# Patient Record
Sex: Female | Born: 1950
Health system: Southern US, Community
[De-identification: ages and names within clinical notes are randomized; demographics above are authoritative.]

## PROBLEM LIST (undated history)

## (undated) DIAGNOSIS — Z8709 Personal history of other diseases of the respiratory system: Secondary | ICD-10-CM

## (undated) DIAGNOSIS — M1990 Unspecified osteoarthritis, unspecified site: Secondary | ICD-10-CM

## (undated) DIAGNOSIS — R203 Hyperesthesia: Secondary | ICD-10-CM

## (undated) DIAGNOSIS — Z8619 Personal history of other infectious and parasitic diseases: Secondary | ICD-10-CM

## (undated) DIAGNOSIS — Z972 Presence of dental prosthetic device (complete) (partial): Secondary | ICD-10-CM

## (undated) DIAGNOSIS — R0600 Dyspnea, unspecified: Secondary | ICD-10-CM

## (undated) DIAGNOSIS — C8330 Diffuse large B-cell lymphoma, unspecified site: Secondary | ICD-10-CM

## (undated) DIAGNOSIS — F419 Anxiety disorder, unspecified: Secondary | ICD-10-CM

## (undated) DIAGNOSIS — J3081 Allergic rhinitis due to animal (cat) (dog) hair and dander: Secondary | ICD-10-CM

## (undated) DIAGNOSIS — H269 Unspecified cataract: Secondary | ICD-10-CM

## (undated) HISTORY — DX: Diffuse large B-cell lymphoma, unspecified site: C83.30

## (undated) HISTORY — PX: CHEST TUBE INSERTION: SHX231

## (undated) HISTORY — PX: BREAST BIOPSY: SHX20

## (undated) HISTORY — PX: LAPAROSCOPIC UNILATERAL SALPINGO OOPHERECTOMY: SHX5935

---

## 1998-09-19 ENCOUNTER — Ambulatory Visit (HOSPITAL_COMMUNITY): Admission: RE | Admit: 1998-09-19 | Discharge: 1998-09-19 | Payer: Self-pay | Admitting: Family Medicine

## 1998-09-19 ENCOUNTER — Encounter: Payer: Self-pay | Admitting: Family Medicine

## 1999-06-14 ENCOUNTER — Other Ambulatory Visit: Admission: RE | Admit: 1999-06-14 | Discharge: 1999-06-14 | Payer: Self-pay | Admitting: Family Medicine

## 1999-10-18 ENCOUNTER — Encounter: Payer: Self-pay | Admitting: Family Medicine

## 1999-10-18 ENCOUNTER — Ambulatory Visit (HOSPITAL_COMMUNITY): Admission: RE | Admit: 1999-10-18 | Discharge: 1999-10-18 | Payer: Self-pay | Admitting: Family Medicine

## 1999-10-27 ENCOUNTER — Ambulatory Visit (HOSPITAL_COMMUNITY): Admission: RE | Admit: 1999-10-27 | Discharge: 1999-10-27 | Payer: Self-pay | Admitting: Family Medicine

## 1999-10-27 ENCOUNTER — Encounter: Payer: Self-pay | Admitting: Family Medicine

## 2000-06-19 ENCOUNTER — Other Ambulatory Visit: Admission: RE | Admit: 2000-06-19 | Discharge: 2000-06-19 | Payer: Self-pay | Admitting: Emergency Medicine

## 2001-07-23 ENCOUNTER — Ambulatory Visit (HOSPITAL_COMMUNITY): Admission: RE | Admit: 2001-07-23 | Discharge: 2001-07-23 | Payer: Self-pay | Admitting: Family Medicine

## 2001-07-23 ENCOUNTER — Encounter: Payer: Self-pay | Admitting: Family Medicine

## 2002-07-24 ENCOUNTER — Ambulatory Visit (HOSPITAL_COMMUNITY): Admission: RE | Admit: 2002-07-24 | Discharge: 2002-07-24 | Payer: Self-pay | Admitting: Family Medicine

## 2002-07-24 ENCOUNTER — Encounter: Payer: Self-pay | Admitting: Family Medicine

## 2003-07-30 ENCOUNTER — Encounter: Payer: Self-pay | Admitting: Family Medicine

## 2003-07-30 ENCOUNTER — Ambulatory Visit (HOSPITAL_COMMUNITY): Admission: RE | Admit: 2003-07-30 | Discharge: 2003-07-30 | Payer: Self-pay | Admitting: Family Medicine

## 2005-06-07 ENCOUNTER — Ambulatory Visit: Payer: Self-pay | Admitting: Gastroenterology

## 2005-08-02 ENCOUNTER — Ambulatory Visit: Payer: Self-pay | Admitting: Gastroenterology

## 2015-03-16 ENCOUNTER — Other Ambulatory Visit (HOSPITAL_COMMUNITY): Payer: Self-pay | Admitting: Internal Medicine

## 2015-03-16 DIAGNOSIS — B182 Chronic viral hepatitis C: Secondary | ICD-10-CM

## 2015-03-21 ENCOUNTER — Ambulatory Visit (HOSPITAL_COMMUNITY)
Admission: RE | Admit: 2015-03-21 | Discharge: 2015-03-21 | Disposition: A | Discharge status: Home / Self Care | Payer: 59 | Source: Ambulatory Visit | Attending: Internal Medicine | Admitting: Internal Medicine

## 2015-03-21 DIAGNOSIS — B1920 Unspecified viral hepatitis C without hepatic coma: Secondary | ICD-10-CM | POA: Insufficient documentation

## 2015-03-21 DIAGNOSIS — B182 Chronic viral hepatitis C: Secondary | ICD-10-CM

## 2016-09-18 ENCOUNTER — Other Ambulatory Visit (HOSPITAL_COMMUNITY): Payer: Self-pay | Admitting: Endocrinology

## 2016-09-18 DIAGNOSIS — E041 Nontoxic single thyroid nodule: Secondary | ICD-10-CM

## 2016-09-18 DIAGNOSIS — R590 Localized enlarged lymph nodes: Secondary | ICD-10-CM

## 2016-09-24 ENCOUNTER — Ambulatory Visit (HOSPITAL_COMMUNITY)
Admission: RE | Admit: 2016-09-24 | Discharge: 2016-09-24 | Disposition: A | Discharge status: Home / Self Care | Payer: BLUE CROSS/BLUE SHIELD | Source: Ambulatory Visit | Attending: Endocrinology | Admitting: Endocrinology

## 2016-09-24 DIAGNOSIS — E041 Nontoxic single thyroid nodule: Secondary | ICD-10-CM | POA: Diagnosis present

## 2016-09-24 DIAGNOSIS — R918 Other nonspecific abnormal finding of lung field: Secondary | ICD-10-CM | POA: Diagnosis not present

## 2016-09-24 DIAGNOSIS — R590 Localized enlarged lymph nodes: Secondary | ICD-10-CM | POA: Diagnosis present

## 2016-09-25 ENCOUNTER — Other Ambulatory Visit (HOSPITAL_COMMUNITY): Payer: Self-pay | Admitting: Endocrinology

## 2016-09-25 DIAGNOSIS — R591 Generalized enlarged lymph nodes: Secondary | ICD-10-CM

## 2016-10-02 ENCOUNTER — Other Ambulatory Visit: Payer: Self-pay | Admitting: Radiology

## 2016-10-03 ENCOUNTER — Ambulatory Visit (HOSPITAL_COMMUNITY)
Admission: RE | Admit: 2016-10-03 | Discharge: 2016-10-03 | Disposition: A | Discharge status: Home / Self Care | Payer: BLUE CROSS/BLUE SHIELD | Source: Ambulatory Visit | Attending: Endocrinology | Admitting: Endocrinology

## 2016-10-03 ENCOUNTER — Encounter (HOSPITAL_COMMUNITY): Payer: Self-pay

## 2016-10-03 DIAGNOSIS — Z806 Family history of leukemia: Secondary | ICD-10-CM | POA: Insufficient documentation

## 2016-10-03 DIAGNOSIS — R911 Solitary pulmonary nodule: Secondary | ICD-10-CM | POA: Insufficient documentation

## 2016-10-03 DIAGNOSIS — R591 Generalized enlarged lymph nodes: Secondary | ICD-10-CM

## 2016-10-03 DIAGNOSIS — Z85828 Personal history of other malignant neoplasm of skin: Secondary | ICD-10-CM | POA: Insufficient documentation

## 2016-10-03 DIAGNOSIS — R222 Localized swelling, mass and lump, trunk: Secondary | ICD-10-CM | POA: Diagnosis not present

## 2016-10-03 DIAGNOSIS — J9383 Other pneumothorax: Secondary | ICD-10-CM | POA: Diagnosis not present

## 2016-10-03 DIAGNOSIS — J45909 Unspecified asthma, uncomplicated: Secondary | ICD-10-CM | POA: Diagnosis not present

## 2016-10-03 DIAGNOSIS — B1920 Unspecified viral hepatitis C without hepatic coma: Secondary | ICD-10-CM | POA: Insufficient documentation

## 2016-10-03 DIAGNOSIS — Z801 Family history of malignant neoplasm of trachea, bronchus and lung: Secondary | ICD-10-CM | POA: Diagnosis not present

## 2016-10-03 DIAGNOSIS — R590 Localized enlarged lymph nodes: Secondary | ICD-10-CM | POA: Insufficient documentation

## 2016-10-03 DIAGNOSIS — Z87891 Personal history of nicotine dependence: Secondary | ICD-10-CM | POA: Diagnosis not present

## 2016-10-03 HISTORY — DX: Anxiety disorder, unspecified: F41.9

## 2016-10-03 LAB — PROTIME-INR
INR: 1.01
PROTHROMBIN TIME: 13.3 s (ref 11.4–15.2)

## 2016-10-03 LAB — CBC
HCT: 38 % (ref 36.0–46.0)
HEMOGLOBIN: 12.7 g/dL (ref 12.0–15.0)
MCH: 29.2 pg (ref 26.0–34.0)
MCHC: 33.4 g/dL (ref 30.0–36.0)
MCV: 87.4 fL (ref 78.0–100.0)
PLATELETS: 319 10*3/uL (ref 150–400)
RBC: 4.35 MIL/uL (ref 3.87–5.11)
RDW: 12.9 % (ref 11.5–15.5)
WBC: 6.5 10*3/uL (ref 4.0–10.5)

## 2016-10-03 LAB — APTT: APTT: 27 s (ref 24–36)

## 2016-10-03 MED ORDER — SODIUM CHLORIDE 0.9 % IV SOLN
INTRAVENOUS | Status: DC
  Administered 2016-10-03: 11:00:00 via INTRAVENOUS

## 2016-10-03 MED ORDER — FENTANYL CITRATE (PF) 100 MCG/2ML IJ SOLN
INTRAMUSCULAR | Status: AC
  Filled 2016-10-03: qty 4

## 2016-10-03 MED ORDER — MIDAZOLAM HCL 2 MG/2ML IJ SOLN
INTRAMUSCULAR | Status: AC | PRN
  Administered 2016-10-03 (×2): 1 mg via INTRAVENOUS
  Administered 2016-10-03: 0.5 mg via INTRAVENOUS

## 2016-10-03 MED ORDER — FENTANYL CITRATE (PF) 100 MCG/2ML IJ SOLN
INTRAMUSCULAR | Status: AC | PRN
  Administered 2016-10-03: 50 ug via INTRAVENOUS

## 2016-10-03 MED ORDER — MIDAZOLAM HCL 2 MG/2ML IJ SOLN
INTRAMUSCULAR | Status: AC
  Filled 2016-10-03: qty 6

## 2016-10-03 NOTE — Consult Note (Signed)
Chief Complaint: Patient was seen in consultation today for ultrasound guided right neck lymph node biopsy   Referring Physician(s): Balan,Bindubal  Supervising Physician: Arne Cleveland  Patient Status: [facility] - Out-pt  History of Present Illness: Monica Gray is a 65 y.o. female , former smoker, with past medical history significant for asthma, hepatitis C with previous treatment, prior spontaneous right pneumothoraces 2 in the late 80's, recently diagnosed left facial basal cell carcinoma. Patient has noted some right neck adenopathy since March 2016. Imaging has recently revealed large right level 5A nodal mass measuring up to 2.9 cm.In addition there were pleural masses of the medial posterior right chest and anterior lateral left chest. Patient has no known primary malignancy. She presents today for ultrasound-guided biopsy of this enlarged right neck lymph node for further evaluation.  Past medical history: See above, denies hypertension, diabetes, coronary artery disease, COPD  Past surgical history: dental implants, right breast biopsy, tubal pregnancy      Allergies: Shellfish allergy  Medications: Prior to Admission medications   Not on File     FH: history of leukemia in mother and lung cancer in brother  Social History   Social History  . Marital status: Married    Spouse name: N/A  . Number of children: N/A  . Years of education: N/A   Social History Main Topics  . Smoking status: Not on file  . Smokeless tobacco: Not on file  . Alcohol use Not on file  . Drug use: Unknown  . Sexual activity: Not on file   Other Topics Concern  . Not on file   Social History Narrative  . No narrative on file      Review of Systems currently denies fever although has had temperature elevations over the last month; has had some intermittent lateral chest discomfort, no significant dyspnea or cough. Denies abdominal pain, nausea, vomiting or abnormal bleeding.  Does have occasional left upper back/shoulder discomfort, night sweats  Vital Signs: BP (!) 156/78 (BP Location: Left Arm)   Pulse 90   Temp 98.6 F (37 C) (Oral)   Resp 18   SpO2 100%   Physical Exam patient awake, alert. Palpable, nontender right neck lymph node;Chest clear to auscultation bilat. Heart with regular rate and rhythm. Abdomen soft, positive bowel sounds, nontender. Lower extremities with full range of motion and no significant edema.  Mallampati Score:     Imaging: Ct Soft Tissue Neck Wo Contrast  Result Date: 09/25/2016 CLINICAL DATA:  Enlarged right-sided lymph nodes. EXAM: CT NECK WITHOUT CONTRAST TECHNIQUE: Multidetector CT imaging of the neck was performed following the standard protocol without intravenous contrast. COMPARISON:  None. FINDINGS: Pharynx and larynx: The nasopharynx is clear. The oropharynx and hypopharynx are normal. The epiglottis is normal. The supraglottic larynx, glottis and subglottic larynx are normal. No retropharyngeal collection. The parapharyngeal spaces are preserved. Salivary glands: The parotid and submandibular glands are normal. No sialolithiasis or salivary ductal dilatation. Thyroid: There is multifocal hypoattenuation within the thyroid gland, better evaluated on recent thyroid ultrasound. Lymph nodes: There is an enlarged level 5A lymph node on the right that measures 2.1 x 1.1 x 2.9 cm. There are multiple other adjacent smaller lymph nodes that measure less than 1 cm. A left level 4 lymph node measures 1.1 cm. Vascular: Assessment of the vessels is limited without IV contrast. Limited intracranial: Normal Mastoids and visualized paranasal sinuses: Clear Skeleton: Is no lytic or blastic lesions. No advanced bony spinal canal stenosis. Upper chest:  There is a posterior pleural-based soft tissue mass along the medial posterior aspect of the right hemithorax that measures 4.6 x 1.7 cm. There is a medium-sized right pleural effusion. The mass  encroaches on the right T2-3 neural foramen. No definite extension into the spinal canal. Additionally, there is a left anterior pleural mass measuring 2.1 x 0.8 cm. There is a focal area of pleural calcification near the left lung apex. Other: None. IMPRESSION: 1. Large right level 5A a nodal mass measuring up to 2.9 cm. This is suggestive of metastatic disease from an unknown primary. Histologic sampling is recommended. 2. Pleural masses of the medial posterior right chest and anterior lateral left chest. The larger mass, adjacent to the right aspect of the T3 and T4 vertebral bodies, extends into the T2-T3 and T3-T4 neural foramina without definite extension into the spinal canal. These masses are also favored to be metastases. A primary pleural malignancy such as mesothelioma is also a consideration. Electronically Signed   By: Ulyses Jarred M.D.   On: 09/25/2016 00:31   US Thyroid  Result Date: 09/25/2016 CLINICAL DATA:  Other.  Thyroid nodule EXAM: THYROID ULTRASOUND TECHNIQUE: Ultrasound examination of the thyroid gland and adjacent soft tissues was performed. COMPARISON:  None. FINDINGS: Parenchymal Echotexture: Mildly heterogenous Estimated total number of nodules >/= 1 cm: 1 Number of spongiform nodules >/=  2 cm not described below (TR1): 0 Number of mixed cystic and solid nodules >/= 1.5 cm not described below (TR2): 0 _________________________________________________________ Isthmus: 0.2 cm No discrete nodules are identified within the thyroid isthmus. _________________________________________________________ Right lobe: 4.6 x 1.6 x 1.7 cm Nodule # 1: Location: Right; Inferior Size: 0.9 x 1.0 x 0.9 cm Composition: solid/almost completely solid (2) Echogenicity: isoechoic (1) Shape: not taller-than-wide (0) Margins: smooth (0) Echogenic foci: none (0) ACR TI-RADS total points: 3. ACR TI-RADS risk category: TR3 (3 points). ACR TI-RADS recommendations: Given size (<1.4 cm) and appearance, this nodule  does NOT meet TI-RADS criteria for biopsy or dedicated follow-up. Tiny adjacent hypoechoic cyst has a benign appearance. _________________________________________________________ Left lobe: 4.3 x 1.4 x 1.2 cm No discrete nodules are identified within the left lobe of the thyroid. Additional findings: There is an abnormally enlarged right neck lymph node with a short axis diameter of 1.1 cm. There is no fatty hilum. IMPRESSION: 0.1 cm nodule in the right lobe. No further recommendations at this time. Abnormally enlarged right neck lymph node. Malignancy is not excluded. The above is in keeping with the ACR TI-RADS recommendations - J Am Coll Radiol 2017;14:587-595. Electronically Signed   By: Marybelle Killings M.D.   On: 09/25/2016 07:41    Labs:  CBC:  Recent Labs  10/03/16 1108  WBC 6.5  HGB 12.7  HCT 38.0  PLT 319    COAGS: No results for input(s): INR, APTT in the last 8760 hours.  BMP: No results for input(s): NA, K, CL, CO2, GLUCOSE, BUN, CALCIUM, CREATININE, GFRNONAA, GFRAA in the last 8760 hours.  Invalid input(s): CMP  LIVER FUNCTION TESTS: No results for input(s): BILITOT, AST, ALT, ALKPHOS, PROT, ALBUMIN in the last 8760 hours.  TUMOR MARKERS: No results for input(s): AFPTM, CEA, CA199, CHROMGRNA in the last 8760 hours.  Assessment and Plan: 65 y.o. female , former smoker, with past medical history significant for asthma, hepatitis C with  previous treatment, prior spontaneous right pneumothoraces 2 in the late 80s, recently diagnosed left facial basal cell carcinoma. Patient has noted some right neck adenopathy since March 2016. Imaging  has recently revealed large right level 5A nodal mass measuring up to 2.9 cm. In addition there were pleural masses of the medial posterior right chest and anterior lateral left chest. Patient has no known primary malignancy. She presents today for ultrasound-guided biopsy of this enlarged right neck lymph node for further evaluation. Risks and  benefits discussed with the patient/husband including, but not limited to bleeding, infection, damage to adjacent structures or low yield requiring additional tests. All of the patient's questions were answered, patient is agreeable to proceed.Consent signed and in chart.     Thank you for this interesting consult.  I greatly enjoyed meeting Aryia Prell and look forward to participating in their care.  A copy of this report was sent to the requesting provider on this date.  Electronically Signed: D. Rowe Robert 10/03/2016, 11:23 AM   I spent a total of 20 minutes  in face to face in clinical consultation, greater than 50% of which was counseling/coordinating care for ultrasound-guided right neck lymph node biopsy

## 2016-10-03 NOTE — Procedures (Signed)
[place] core bx R cerv LAN 18g x4 to surg path No complication No blood loss. See complete dictation in [facility].

## 2016-10-03 NOTE — Discharge Instructions (Signed)
Needle Biopsy, Care After Introduction These instructions give you information about caring for yourself after your procedure. Your doctor may also give you more specific instructions. Call your doctor if you have any problems or questions after your procedure. Follow these instructions at home:  Rest as told by your doctor.  Take medicines only as told by your doctor.  There are many different ways to close and cover the biopsy site, including stitches (sutures), skin glue, and adhesive strips. Follow instructions from your doctor about:  How to take care of your biopsy site.  When and how you should change your bandage (dressing).  When you should remove your dressing.  Removing whatever was used to close your biopsy site.  Check your biopsy site every day for signs of infection. Watch for:  Redness, swelling, or pain.  Fluid, blood, or pus. Contact a doctor if:  You have a fever.  You have redness, swelling, or pain at the biopsy site, and it lasts longer than a few days.  You have fluid, blood, or pus coming from the biopsy site.  You feel sick to your stomach (nauseous).  You throw up (vomit). Get help right away if:  You are short of breath.  You have trouble breathing.  Your chest hurts.  You feel dizzy or you pass out (faint).  You have bleeding that does not stop with pressure or a bandage.  You cough up blood.  Your belly (abdomen) hurts. This information is not intended to replace advice given to you by your health care provider. Make sure you discuss any questions you have with your health care provider. Document Released: 10/18/2008 Document Revised: 04/12/2016 Document Reviewed: 11/01/2014   2017 Elsevier Moderate Conscious Sedation, Adult Sedation is the use of medicines to promote relaxation and relieve discomfort and anxiety. Moderate conscious sedation is a type of sedation. Under moderate conscious sedation, you are less alert than normal,  but you are still able to respond to instructions, touch, or both. Moderate conscious sedation is used during short medical and dental procedures. It is milder than deep sedation, which is a type of sedation under which you cannot be easily woken up. It is also milder than general anesthesia, which is the use of medicines to make you unconscious. Moderate conscious sedation allows you to return to your regular activities sooner. Tell a health care provider about:  Any allergies you have.  All medicines you are taking, including vitamins, herbs, eye drops, creams, and over-the-counter medicines.  Use of steroids (by mouth or creams).  Any problems you or family members have had with sedatives and anesthetic medicines.  Any blood disorders you have.  Any surgeries you have had.  Any medical conditions you have, such as sleep apnea.  Whether you are pregnant or may be pregnant.  Any use of cigarettes, alcohol, marijuana, or street drugs. What are the risks? Generally, this is a safe procedure. However, problems may occur, including:  Getting too much medicine (oversedation).  Nausea.  Allergic reaction to medicines.  Trouble breathing. If this happens, a breathing tube may be used to help with breathing. It will be removed when you are awake and breathing on your own.  Heart trouble.  Lung trouble. What happens before the procedure? Staying hydrated  Follow instructions from your health care provider about hydration, which may include:  Up to 2 hours before the procedure - you may continue to drink clear liquids, such as water, clear fruit juice, black coffee, and plain  tea. Eating and drinking restrictions  Follow instructions from your health care provider about eating and drinking, which may include:  8 hours before the procedure - stop eating heavy meals or foods such as meat, fried foods, or fatty foods.  6 hours before the procedure - stop eating light meals or foods,  such as toast or cereal.  6 hours before the procedure - stop drinking milk or drinks that contain milk.  2 hours before the procedure - stop drinking clear liquids. Medicine  Ask your health care provider about:  Changing or stopping your regular medicines. This is especially important if you are taking diabetes medicines or blood thinners.  Taking medicines such as aspirin and ibuprofen. These medicines can thin your blood. Do not take these medicines before your procedure if your health care provider instructs you not to. Tests and exams  You will have a physical exam.  You may have blood tests done to show:  How well your kidneys and liver are working.  How well your blood can clot. General instructions  Plan to have someone take you home from the hospital or clinic.  If you will be going home right after the procedure, plan to have someone with you for 24 hours. What happens during the procedure?  An IV tube will be inserted into one of your veins.  Medicine to help you relax (sedative) will be given through the IV tube.  The medical or dental procedure will be performed. What happens after the procedure?  Your blood pressure, heart rate, breathing rate, and blood oxygen level will be monitored often until the medicines you were given have worn off.  Do not drive for 24 hours. This information is not intended to replace advice given to you by your health care provider. Make sure you discuss any questions you have with your health care provider. Document Released: 07/31/2001 Document Revised: 04/10/2016 Document Reviewed: 02/25/2016 Elsevier Interactive Patient Education  2017 [employer].   Moderate Conscious Sedation, Adult, Care After These instructions provide you with information about caring for yourself after your procedure. Your health care provider may also give you more specific instructions. Your treatment has been planned according to current medical  practices, but problems sometimes occur. Call your health care provider if you have any problems or questions after your procedure. What can I expect after the procedure? After your procedure, it is common:  To feel sleepy for several hours.  To feel clumsy and have poor balance for several hours.  To have poor judgment for several hours.  To vomit if you eat too soon. Follow these instructions at home: For at least 24 hours after the procedure:   Do not:  Participate in activities where you could fall or become injured.  Drive.  Use heavy machinery.  Drink alcohol.  Take sleeping pills or medicines that cause drowsiness.  Make important decisions or sign legal documents.  Take care of children on your own.  Rest. Eating and drinking  Follow the diet recommended by your health care provider.  If you vomit:  Drink water, juice, or soup when you can drink without vomiting.  Make sure you have little or no nausea before eating solid foods. General instructions  Have a responsible adult stay with you until you are awake and alert.  Take over-the-counter and prescription medicines only as told by your health care provider.  If you smoke, do not smoke without supervision.  Keep all follow-up visits as told by  your health care provider. This is important. Contact a health care provider if:  You keep feeling nauseous or you keep vomiting.  You feel light-headed.  You develop a rash.  You have a fever. Get help right away if:  You have trouble breathing. This information is not intended to replace advice given to you by your health care provider. Make sure you discuss any questions you have with your health care provider. Document Released: 08/26/2013 Document Revised: 04/09/2016 Document Reviewed: 02/25/2016 Elsevier Interactive Patient Education  2017 [employer].

## 2016-10-25 ENCOUNTER — Telehealth: Payer: Self-pay | Admitting: Internal Medicine

## 2016-10-25 ENCOUNTER — Other Ambulatory Visit (HOSPITAL_COMMUNITY): Payer: Self-pay | Admitting: General Surgery

## 2016-10-25 ENCOUNTER — Other Ambulatory Visit: Payer: Self-pay | Admitting: General Surgery

## 2016-10-25 DIAGNOSIS — J948 Other specified pleural conditions: Secondary | ICD-10-CM

## 2016-10-25 NOTE — Telephone Encounter (Signed)
Lmom to call our office per [facility] for a new patient appt. (notes with Charron Coultas and will be forwarded to Pulmonology when appt has been made.)

## 2016-10-28 NOTE — H&P (Signed)
Monica Gray 10/25/2016 10:28 AM Location: [facility] Patient #: [id number] DOB: December 06, 1950 Married / Language: English / Race: White Female   History of Present Illness        This is a 65 year old Caucasian female from [place]. She is here with her husband throughout the encounter. She was referred by Dr. Jacelyn Pi for consideration of right neck lymph node biopsy to rule out lymphoma. Her PCP is Dr. Russella Dar in [place].        The patient has noted some painless right neck mass for a few months. Slowly enlarging.. She's had chronic night sweats but no skin rash. Minimal, if any weight loss. Moderately severe anxiety.       She has had right chest wall pain in the posterior axillary line up high for about 3-4 weeks. Her PCP thought she might have a thyroid mass and referred her to Dr. Chalmers Cater. Ultrasound of the thyroid showed no thyroid mass. CT scan of the neck showed a large, 2.9 cm nodal mass at level V on the right. Some smaller nodes also noted. Also noted were some pleural masses right and left. The pleural masses were thought to be metastatic or possibly mesothelioma. A chest x-ray performed in [place] on November 27 shows bilateral pleural effusions, right greater than left. No congestive heart failure.       Interventional radiology performed a core biopsy of the right neck mass on October 03, 2016. This shows atypical lymphoid proliferation, suspicious for non-Hodgkin's lymphoma B-cell lymphoma but insufficient for flows or definitive diagnosis. The patient was referred for right cervical lymph node biopsy for confirmation.      She has not seen pulmonary medicine for her intrathoracic problems.      She has not seen medical oncology.      Morbidities include history of pneumothorax right lung, spontaneous, 30 years ago. This happened twice with chest tube each time. Former smoker. Has asthma. History of hepatitis C which was treated.       She  lives in [place] Her husband is with her.       We had a very long talk, almost 1 hour. I told her that we were going to schedule her for right cervical lymph node biopsy. She has 1 large and some smaller ones on the right.. The larger one is somewhat stuck behind the sternocleidomastoid. I may try to get one of the smaller nodes out to lower the risk of spinal accessory nerve damage. I discussed the complication of spinal accessory nerve damage with chronic pain and shoulder disability. Also discussed the indications, details, techniques and numerous risk of the surgery including bleeding, infection, reoperation if the lymph node biopsy was nondiagnostic. She understands all these issues. All questions are answered. She agrees with this plan.       In addition, she is referred to pulmonary medicine immediately for her pleural effusions or pleural based masses. This may or may not be part of her presumed lymphoma we need to know if they need to be biopsied or the pleural effusions need to be tapped.      I am also sending her for CT scan of the chest abdomen and pelvis. I think I feel bilateral inguinal lymph nodes. She has not had a mammogram in over 10 years. I told her that at some point, not urgently, she needs to be scheduled for mammograms. She agrees with all of this. She's been taking Advil for pain. She begged  for something stronger and so I gave her some oxycodone.      Other Problems  Arthritis  Asthma  Back Pain  Chest pain  Hemorrhoids  Hepatitis  Migraine Headache   Past Surgical History Breast Biopsy  Right. Oral Surgery   Diagnostic Studies History  Colonoscopy  never Pap Smear  >5 years ago  Allergies  Acetaminophen *ANALGESICS - NonNarcotic*  Codeine Sulfate *ANALGESICS - OPIOID*  Dyes  red dye Zanaflex *MUSCULOSKELETAL THERAPY AGENTS*  SulfADIAZINE *SULFONAMIDES*  Shellfish   Medication History  Ibuprofen (800MG  Tablet, Oral)  Active. Magnesium (500MG  Tablet, Oral) Active. Multi Vitamin Daily (Oral) Active. Vitamin C (500MG  Tablet, Oral) Active. Probiotic (Oral) Active. Basil Oil Active. (holy basil) [employer] Valerian Night Time (530MG  Capsule, Oral) Active. (valerian chamomile- for sleep) Medications Reconciled  Social History  Alcohol use  Remotely quit alcohol use. Caffeine use  Coffee. Illicit drug use  Remotely quit drug use. Tobacco use  Former smoker.  Family History  Alcohol Abuse  Brother, Mother. Arthritis  Mother. Cancer  Mother. Respiratory Condition  Brother, Father. Thyroid problems  Mother.  Pregnancy / Birth History  Age of menopause  36-60 Gravida  1 Maternal age  19-25 Para  0    Review of Systems  General Present- Appetite Loss, Fatigue, Fever and Night Sweats. Not Present- Chills, Weight Gain and Weight Loss. Respiratory Present- Difficulty Breathing. Not Present- Bloody sputum, Chronic Cough, Snoring and Wheezing. Cardiovascular Present- Leg Cramps and Shortness of Breath. Not Present- Chest Pain, Difficulty Breathing Lying Down, Palpitations, Rapid Heart Rate and Swelling of Extremities. Musculoskeletal Present- Back Pain, Joint Pain and Muscle Pain. Not Present- Joint Stiffness, Muscle Weakness and Swelling of Extremities.  Vitals  Weight: 128 lb Height: 64in Body Surface Area: 1.62 m Body Mass Index: 21.97 kg/m  Pulse: 82 (Regular)  BP: 146/82 (Sitting, Left Arm, Standard)    Physical Exam  General Mental Status-Alert. General Appearance-Consistent with stated age. Hydration-Well hydrated. Voice-Normal. Note: Husband is with her throughout the encounter   Head and Neck Head-normocephalic, atraumatic with no lesions or palpable masses. Trachea-midline. Thyroid Gland Characteristics - normal size and consistency.  Eye Eyeball - Bilateral-Extraocular movements intact. Sclera/Conjunctiva - Bilateral-No scleral  icterus.  Chest and Lung Exam Chest and lung exam reveals -quiet, even and easy respiratory effort with no use of accessory muscles and on auscultation, normal breath sounds, no adventitious sounds and normal vocal resonance. Inspection Chest Wall - Normal. Back - normal.  Breast Breast - Left-Symmetric, Non Tender, No Biopsy scars, no Dimpling, No Inflammation, No Lumpectomy scars, No Mastectomy scars, No Peau d' Orange. Breast - Right-Symmetric, Non Tender, No Biopsy scars, no Dimpling, No Inflammation, No Lumpectomy scars, No Mastectomy scars, No Peau d' Orange. Breast Lump-No Palpable Breast Mass.  Cardiovascular Cardiovascular examination reveals -normal heart sounds, regular rate and rhythm with no murmurs and normal pedal pulses bilaterally.  Abdomen Inspection Inspection of the abdomen reveals - No Hernias. Skin - Scar - no surgical scars. Palpation/Percussion Palpation and Percussion of the abdomen reveal - Soft, Non Tender, No Rebound tenderness, No Rigidity (guarding) and No hepatosplenomegaly. Auscultation Auscultation of the abdomen reveals - Bowel sounds normal.  Neurologic Neurologic evaluation reveals -alert and oriented x 3 with no impairment of recent or remote memory. Mental Status-Normal.  Neuropsychiatric Note: Alert. Good insight. Cooperative. Extreme anxiety. Cries frequently. Suspect this is situational and not chronic.   Musculoskeletal Normal Exam - Left-Upper Extremity Strength Normal and Lower Extremity Strength Normal. Normal Exam - Right-Upper Extremity Strength  Normal and Lower Extremity Strength Normal.  Lymphatic Note: There is a 3 cm x 1 cm mass which appears to be associated with sternocleidomastoid muscle on the right. This moves around may be fixed to the muscle. There are some smaller nodes inferior and posterior to this which might be better biopsy. There is also a small node in the posterior triangle on the left. The  thyroid does not feel enlarged. No supraclavicular adenopathy. No significant adenopathy in the axilla on either side. I think there are bilateral femoral lymph nodes. On the right this appears below the inguinal crease. On the left it appears above. There are 2.5 cm in size at least, by my exam. No signs of infection or tenderness.     Assessment & Plan LYMPHADENOPATHY, CERVICAL (R59.0)   You have enlarged lymph nodes in your neck, primarily on the right side I am suspicious that you have enlarged lymph nodes in both groins. The biopsy of your right neck is suspicious for lymphoma, but is not diagnostic you will need an operation to excise one of these lymph nodes in your right neck This will be scheduled in the near future I have discussed the indications, techniques, and numerous risk of the surgery, including injury to the spinal accessory nerve.  You also have some pleural-based masses in your chest and some fluid in your chest This may or may not be due to the same process You'll immediately be referred to a pulmonary medicine specialist  You will also be immediately scheduled for CT scan of chest abdomen and pelvis  You have been given a prescription for hydrocodone to help the right chest wall pain. Takes Senokot twice a day to avoid constipation Drink lots of fluids  PLEURAL EFFUSION (J90) PLEURAL MASS (J94.9) HISTORY OF HEPATITIS C (Z86.19) Impression: Reportedly treated PNEUMOTHORAX, RIGHT (J93.9) Impression: 2 episodes, 1987, 1988, spontaneous. FORMER SMOKER (Z87.891) ANXIETY ATTACK (F41.0) Impression: Recent, situational ASTHMA IN REMISSION [id number])    Edsel Petrin. Dalbert Batman, M.D., [facility] General and Minimally invasive Surgery Breast and Colorectal Surgery Office:   [phone] Pager:   [phone]

## 2016-10-30 ENCOUNTER — Encounter (HOSPITAL_COMMUNITY): Payer: Self-pay | Admitting: *Deleted

## 2016-10-30 ENCOUNTER — Ambulatory Visit (HOSPITAL_COMMUNITY)
Admission: RE | Admit: 2016-10-30 | Discharge: 2016-10-30 | Disposition: A | Discharge status: Home / Self Care | Payer: BLUE CROSS/BLUE SHIELD | Source: Ambulatory Visit | Attending: General Surgery | Admitting: General Surgery

## 2016-10-30 ENCOUNTER — Encounter (HOSPITAL_COMMUNITY): Payer: Self-pay

## 2016-10-30 DIAGNOSIS — J949 Pleural condition, unspecified: Secondary | ICD-10-CM | POA: Diagnosis not present

## 2016-10-30 DIAGNOSIS — I700 Atherosclerosis of aorta: Secondary | ICD-10-CM | POA: Diagnosis not present

## 2016-10-30 DIAGNOSIS — R590 Localized enlarged lymph nodes: Secondary | ICD-10-CM | POA: Insufficient documentation

## 2016-10-30 DIAGNOSIS — J948 Other specified pleural conditions: Secondary | ICD-10-CM

## 2016-10-30 DIAGNOSIS — D3501 Benign neoplasm of right adrenal gland: Secondary | ICD-10-CM | POA: Insufficient documentation

## 2016-10-30 DIAGNOSIS — J90 Pleural effusion, not elsewhere classified: Secondary | ICD-10-CM | POA: Diagnosis present

## 2016-10-30 DIAGNOSIS — N289 Disorder of kidney and ureter, unspecified: Secondary | ICD-10-CM | POA: Diagnosis not present

## 2016-10-30 DIAGNOSIS — I2510 Atherosclerotic heart disease of native coronary artery without angina pectoris: Secondary | ICD-10-CM | POA: Insufficient documentation

## 2016-10-30 LAB — POCT I-STAT CREATININE: CREATININE: 0.6 mg/dL (ref 0.44–1.00)

## 2016-10-30 MED ORDER — IOPAMIDOL (ISOVUE-300) INJECTION 61%
75.0000 mL | Freq: Once | INTRAVENOUS | Status: AC | PRN
  Administered 2016-10-30: 75 mL via INTRAVENOUS

## 2016-10-31 ENCOUNTER — Encounter (HOSPITAL_COMMUNITY)
Admission: RE | Disposition: A | Discharge status: Home / Self Care | Payer: Self-pay | Source: Ambulatory Visit | Attending: General Surgery

## 2016-10-31 ENCOUNTER — Ambulatory Visit (HOSPITAL_COMMUNITY): Payer: BLUE CROSS/BLUE SHIELD | Admitting: Certified Registered"

## 2016-10-31 ENCOUNTER — Ambulatory Visit (HOSPITAL_COMMUNITY)
Admission: RE | Admit: 2016-10-31 | Discharge: 2016-10-31 | Disposition: A | Discharge status: Home / Self Care | Payer: BLUE CROSS/BLUE SHIELD | Source: Ambulatory Visit | Attending: General Surgery | Admitting: General Surgery

## 2016-10-31 ENCOUNTER — Encounter (HOSPITAL_COMMUNITY): Payer: Self-pay | Admitting: Surgery

## 2016-10-31 DIAGNOSIS — J90 Pleural effusion, not elsewhere classified: Secondary | ICD-10-CM | POA: Insufficient documentation

## 2016-10-31 DIAGNOSIS — F419 Anxiety disorder, unspecified: Secondary | ICD-10-CM | POA: Insufficient documentation

## 2016-10-31 DIAGNOSIS — R591 Generalized enlarged lymph nodes: Secondary | ICD-10-CM | POA: Diagnosis present

## 2016-10-31 DIAGNOSIS — Z8619 Personal history of other infectious and parasitic diseases: Secondary | ICD-10-CM | POA: Insufficient documentation

## 2016-10-31 DIAGNOSIS — J45909 Unspecified asthma, uncomplicated: Secondary | ICD-10-CM | POA: Insufficient documentation

## 2016-10-31 DIAGNOSIS — C8330 Diffuse large B-cell lymphoma, unspecified site: Secondary | ICD-10-CM | POA: Diagnosis present

## 2016-10-31 DIAGNOSIS — Z87891 Personal history of nicotine dependence: Secondary | ICD-10-CM | POA: Diagnosis not present

## 2016-10-31 HISTORY — DX: Diffuse large B-cell lymphoma, unspecified site: C83.30

## 2016-10-31 HISTORY — PX: LYMPH NODE BIOPSY: SHX201

## 2016-10-31 HISTORY — DX: Unspecified osteoarthritis, unspecified site: M19.90

## 2016-10-31 HISTORY — DX: Dyspnea, unspecified: R06.00

## 2016-10-31 LAB — CBC WITH DIFFERENTIAL/PLATELET
BASOS ABS: 0 10*3/uL (ref 0.0–0.1)
Basophils Relative: 1 %
EOS PCT: 3 %
Eosinophils Absolute: 0.1 10*3/uL (ref 0.0–0.7)
HEMATOCRIT: 37.6 % (ref 36.0–46.0)
Hemoglobin: 12.8 g/dL (ref 12.0–15.0)
LYMPHS ABS: 0.7 10*3/uL (ref 0.7–4.0)
LYMPHS PCT: 14 %
MCH: 28.8 pg (ref 26.0–34.0)
MCHC: 34 g/dL (ref 30.0–36.0)
MCV: 84.7 fL (ref 78.0–100.0)
Monocytes Absolute: 0.6 10*3/uL (ref 0.1–1.0)
Monocytes Relative: 13 %
NEUTROS ABS: 3.6 10*3/uL (ref 1.7–7.7)
Neutrophils Relative %: 71 %
Platelets: 254 10*3/uL (ref 150–400)
RBC: 4.44 MIL/uL (ref 3.87–5.11)
RDW: 13.3 % (ref 11.5–15.5)
WBC: 5.1 10*3/uL (ref 4.0–10.5)

## 2016-10-31 LAB — COMPREHENSIVE METABOLIC PANEL
ALK PHOS: 50 U/L (ref 38–126)
ALT: 15 U/L (ref 14–54)
AST: 30 U/L (ref 15–41)
Albumin: 4 g/dL (ref 3.5–5.0)
Anion gap: 10 (ref 5–15)
BILIRUBIN TOTAL: 1 mg/dL (ref 0.3–1.2)
BUN: 7 mg/dL (ref 6–20)
CALCIUM: 9.9 mg/dL (ref 8.9–10.3)
CHLORIDE: 103 mmol/L (ref 101–111)
CO2: 26 mmol/L (ref 22–32)
CREATININE: 0.61 mg/dL (ref 0.44–1.00)
Glucose, Bld: 96 mg/dL (ref 65–99)
Potassium: 3.8 mmol/L (ref 3.5–5.1)
Sodium: 139 mmol/L (ref 135–145)
Total Protein: 7.5 g/dL (ref 6.5–8.1)

## 2016-10-31 SURGERY — LYMPH NODE BIOPSY
Anesthesia: General | Site: Neck | Laterality: Right

## 2016-10-31 MED ORDER — FENTANYL CITRATE (PF) 100 MCG/2ML IJ SOLN
INTRAMUSCULAR | Status: DC | PRN
  Administered 2016-10-31 (×3): 100 ug via INTRAVENOUS

## 2016-10-31 MED ORDER — ONDANSETRON HCL 4 MG/2ML IJ SOLN
INTRAMUSCULAR | Status: DC | PRN
  Administered 2016-10-31: 4 mg via INTRAVENOUS

## 2016-10-31 MED ORDER — LIDOCAINE 2% (20 MG/ML) 5 ML SYRINGE
INTRAMUSCULAR | Status: AC
  Filled 2016-10-31: qty 5

## 2016-10-31 MED ORDER — DEXAMETHASONE SODIUM PHOSPHATE 10 MG/ML IJ SOLN
INTRAMUSCULAR | Status: DC | PRN
  Administered 2016-10-31: 4 mg via INTRAVENOUS

## 2016-10-31 MED ORDER — SUCCINYLCHOLINE CHLORIDE 200 MG/10ML IV SOSY
PREFILLED_SYRINGE | INTRAVENOUS | Status: DC | PRN
  Administered 2016-10-31: 90 mg via INTRAVENOUS

## 2016-10-31 MED ORDER — FENTANYL CITRATE (PF) 100 MCG/2ML IJ SOLN
INTRAMUSCULAR | Status: AC
  Filled 2016-10-31: qty 2

## 2016-10-31 MED ORDER — PROPOFOL 10 MG/ML IV BOLUS
INTRAVENOUS | Status: AC
  Filled 2016-10-31: qty 20

## 2016-10-31 MED ORDER — BUPIVACAINE HCL (PF) 0.25 % IJ SOLN
INTRAMUSCULAR | Status: AC
  Filled 2016-10-31: qty 30

## 2016-10-31 MED ORDER — FENTANYL CITRATE (PF) 100 MCG/2ML IJ SOLN: 25.0000 ug | INTRAMUSCULAR | Status: DC | PRN

## 2016-10-31 MED ORDER — MIDAZOLAM HCL 5 MG/5ML IJ SOLN
INTRAMUSCULAR | Status: DC | PRN
  Administered 2016-10-31: 2 mg via INTRAVENOUS

## 2016-10-31 MED ORDER — ONDANSETRON HCL 4 MG/2ML IJ SOLN
INTRAMUSCULAR | Status: AC
  Filled 2016-10-31: qty 2

## 2016-10-31 MED ORDER — PROMETHAZINE HCL 25 MG/ML IJ SOLN: 6.2500 mg | INTRAMUSCULAR | Status: DC | PRN

## 2016-10-31 MED ORDER — PROPOFOL 10 MG/ML IV BOLUS
INTRAVENOUS | Status: DC | PRN
  Administered 2016-10-31: 150 mg via INTRAVENOUS
  Administered 2016-10-31: 50 mg via INTRAVENOUS

## 2016-10-31 MED ORDER — CEFAZOLIN SODIUM-DEXTROSE 2-4 GM/100ML-% IV SOLN
INTRAVENOUS | Status: AC
  Filled 2016-10-31: qty 100

## 2016-10-31 MED ORDER — CHLORHEXIDINE GLUCONATE CLOTH 2 % EX PADS: 6.0000 | MEDICATED_PAD | Freq: Once | CUTANEOUS | Status: DC

## 2016-10-31 MED ORDER — CEFAZOLIN SODIUM-DEXTROSE 2-4 GM/100ML-% IV SOLN
2.0000 g | INTRAVENOUS | Status: AC
  Administered 2016-10-31: 2 g via INTRAVENOUS

## 2016-10-31 MED ORDER — MIDAZOLAM HCL 2 MG/2ML IJ SOLN
INTRAMUSCULAR | Status: AC
  Filled 2016-10-31: qty 2

## 2016-10-31 MED ORDER — DEXAMETHASONE SODIUM PHOSPHATE 10 MG/ML IJ SOLN
INTRAMUSCULAR | Status: AC
  Filled 2016-10-31: qty 1

## 2016-10-31 MED ORDER — BUPIVACAINE HCL (PF) 0.25 % IJ SOLN
INTRAMUSCULAR | Status: DC | PRN
  Administered 2016-10-31: 2 mL

## 2016-10-31 MED ORDER — LACTATED RINGERS IV SOLN
INTRAVENOUS | Status: DC
  Administered 2016-10-31 (×2): via INTRAVENOUS

## 2016-10-31 MED ORDER — 0.9 % SODIUM CHLORIDE (POUR BTL) OPTIME
TOPICAL | Status: DC | PRN
  Administered 2016-10-31: 1000 mL

## 2016-10-31 MED ORDER — LIDOCAINE 2% (20 MG/ML) 5 ML SYRINGE
INTRAMUSCULAR | Status: DC | PRN
  Administered 2016-10-31: 60 mg via INTRAVENOUS

## 2016-10-31 SURGICAL SUPPLY — 36 items
APPLIER CLIP 9.375 SM OPEN (CLIP) ×3
CHLORAPREP W/TINT 26ML (MISCELLANEOUS) ×3 IMPLANT
CLIP APPLIE 9.375 SM OPEN (CLIP) ×1 IMPLANT
CONT SPEC 4OZ CLIKSEAL STRL BL (MISCELLANEOUS) ×3 IMPLANT
COVER SURGICAL LIGHT HANDLE (MISCELLANEOUS) ×3 IMPLANT
DECANTER SPIKE VIAL GLASS SM (MISCELLANEOUS) ×6 IMPLANT
DERMABOND ADVANCED (GAUZE/BANDAGES/DRESSINGS) ×2
DERMABOND ADVANCED .7 DNX12 (GAUZE/BANDAGES/DRESSINGS) ×1 IMPLANT
DRAPE LAPAROTOMY 100X72 PEDS (DRAPES) ×3 IMPLANT
ELECT CAUTERY BLADE 6.4 (BLADE) ×3 IMPLANT
ELECT REM PT RETURN 9FT ADLT (ELECTROSURGICAL) ×3
ELECTRODE REM PT RTRN 9FT ADLT (ELECTROSURGICAL) ×1 IMPLANT
GLOVE BIOGEL PI IND STRL 7.0 (GLOVE) ×1 IMPLANT
GLOVE BIOGEL PI INDICATOR 7.0 (GLOVE) ×2
GLOVE EUDERMIC 7 POWDERFREE (GLOVE) ×3 IMPLANT
GLOVE SURG SS PI 6.5 STRL IVOR (GLOVE) ×6 IMPLANT
GLOVE SURG SS PI 7.0 STRL IVOR (GLOVE) ×3 IMPLANT
GOWN STRL REUS W/ TWL LRG LVL3 (GOWN DISPOSABLE) ×2 IMPLANT
GOWN STRL REUS W/TWL LRG LVL3 (GOWN DISPOSABLE) ×4
KIT BASIN OR (CUSTOM PROCEDURE TRAY) ×3 IMPLANT
KIT ROOM TURNOVER OR (KITS) ×3 IMPLANT
NEEDLE HYPO 25GX1X1/2 BEV (NEEDLE) ×3 IMPLANT
NS IRRIG 1000ML POUR BTL (IV SOLUTION) ×3 IMPLANT
PACK SURGICAL SETUP 50X90 (CUSTOM PROCEDURE TRAY) ×3 IMPLANT
PAD ARMBOARD 7.5X6 YLW CONV (MISCELLANEOUS) ×3 IMPLANT
PENCIL BUTTON HOLSTER BLD 10FT (ELECTRODE) ×3 IMPLANT
SPONGE LAP 4X18 X RAY DECT (DISPOSABLE) ×6 IMPLANT
SUT MON AB 4-0 PC3 18 (SUTURE) ×3 IMPLANT
SUT VIC AB 3-0 SH 18 (SUTURE) ×3 IMPLANT
SYR BULB 3OZ (MISCELLANEOUS) ×3 IMPLANT
SYR CONTROL 10ML LL (SYRINGE) ×3 IMPLANT
TOWEL OR 17X24 6PK STRL BLUE (TOWEL DISPOSABLE) ×3 IMPLANT
TOWEL OR 17X26 10 PK STRL BLUE (TOWEL DISPOSABLE) ×3 IMPLANT
TUBE CONNECTING 12'X1/4 (SUCTIONS) ×1
TUBE CONNECTING 12X1/4 (SUCTIONS) ×2 IMPLANT
YANKAUER SUCT BULB TIP NO VENT (SUCTIONS) ×3 IMPLANT

## 2016-10-31 NOTE — Discharge Instructions (Signed)
Place the ice pack on the wound for 10 minutes at a time.  Do this 2 or 3 times in our  You may take a shower tomorrow, if you desire. No tub bath  You have a prescription for oxycodone at home.  Use that as needed to control incisional pain  Call Dr. Dalbert Batman 's office if you have severe pain or swelling  Elevate the head of the bed or sit up in a chair as much as possible today and this evening   I strongly advise referral to a medical oncologist.  I discussed this with your husband and he asked me to go ahead with this My office will contact you regarding referral to a medical oncologist.

## 2016-10-31 NOTE — Op Note (Signed)
Patient Name:           Monica Gray   Date of Surgery:        10/31/2016  Pre op Diagnosis:      Lymphadenopathy, rule out lymphoma  Post op Diagnosis:    Same  Procedure:                 Excision deep cervical lymph node, right posterior triangle  Surgeon:                     Edsel Petrin. Dalbert Batman, M.D., FACS  Assistant:                      OR staff   Indication for Assistant: N/A  Operative Indications:   This is a 65 year old Caucasian female from [place].  She was referred by Dr. Jacelyn Pi for consideration of right neck lymph node biopsy to rule out lymphoma. Her PCP is Dr. Russella Dar in [place].        The patient has noted some painless right neck mass for a few months. Slowly enlarging.. She's had chronic night sweats but no skin rash. Minimal, if any weight loss. Moderately severe anxiety.       She has had right chest wall pain in the posterior axillary line up high for about 3-4 weeks. Her PCP thought she might have a thyroid mass and referred her to Dr. Chalmers Cater. Ultrasound of the thyroid showed no thyroid mass. CT scan of the neck showed a large, 2.9 cm nodal mass at level V on the right. Some smaller nodes also noted. Also noted were some pleural masses right and left. The pleural masses were thought to be metastatic or possibly mesothelioma. A chest x-ray performed in [place] on November 27 shows bilateral pleural effusions, right greater than left. No congestive heart failure.       Interventional radiology performed a core biopsy of the right neck mass on October 03, 2016. This shows atypical lymphoid proliferation, suspicious for non-Hodgkin's lymphoma B-cell lymphoma but insufficient for flows or definitive diagnosis. The patient was referred for right cervical lymph node biopsy for confirmation.      She has not seen pulmonary medicine for her intrathoracic problems.      She has not seen medical oncology.      On exam I found a relatively large fixed mass  involving the right sternocleidomastoid muscle and some smaller but still pathologically enlarged lymph nodes extending inferiorly in the posterior triangle.  There are small lymph nodes in the left neck.  No axillary adenopathy.  Significant bilateral deep inguinal adenopathy.  CT scan of the chest abdomen and pelvis was performed and shows thoracic and abdominal adenopathy, suspicious for lymphoma.      Morbidities include history of pneumothorax right lung, spontaneous, 30 years ago. This happened twice with chest tube each time. Former smoker. Has asthma. History of hepatitis C which was treated.. I told her that we were going to schedule her for right cervical lymph node biopsy. She has 1 large and some smaller ones on the right.. The larger one is somewhat stuck behind the sternocleidomastoid. I may try to get one of the smaller nodes out to lower the risk of spinal accessory nerve damage..       In addition, she is referred to pulmonary medicine immediately for her pleural effusions or pleural based masses. This may or may not be part of her presumed lymphoma  we need to know if they need to be biopsied or the pleural effusions need to be tapped.       She will need to be referred to medical oncology regardless   Operative Findings:       I removed a pathologically enlarged lymph node from the deep right posterior triangle.  The spinal accessory nerve was slightly draped over this but was slowly dissected off and preserved.  I removed the lymph node intact.  It was 1.5 cm x 1.2 cm x 0.5 cm in size.  Sent to the lab with the appropriate history attached fresh in saline.  Lymphoma workup was requested.`  Procedure in Detail:          Following the induction of general endotracheal anesthesia the patient was positioned with her arms at her sides and a roll behind her shoulders which helped to extend the neck.  The head was turned the left.  A little bit of hair had to be clipped.  The right neck  was then extensively prepped and draped in a sterile fashion.  Intravenous antibiotics were given.  Surgical timeout was performed.  0.25% Marcaine was used as local infiltration anesthetic.      I made a transverse skin crease incision, somewhat oblique but in skin lines, overlying the involved lymph node.  I stayed below the fixed lymph node mass superiorly.  Dissection was carefully taken down through the platysma muscle.  I then identified the lymph node and the spinal accessory nerve.  I slowly dissected the lymph node away from the surrounding structures taking great care to avoid the spinal accessory nerve.  Small vascular structures were controlled with small metal clips and divided with a knife.  The lymph node was removed intact.  There was no bleeding.  The lymph node was sent to the lab with the appropriate history attached.  The spinal accessory nerve was identified and inspected and seemed to be intact without any injury whatsoever.  The wound was irrigated.  The platysma muscle was closed with interrupted sutures of 3-0 Vicryl.  The skin was closed with a running subcuticular suture of 4-0 Monocryl and Dermabond.  The patient tolerated the procedure well was taken to PACU in stable condition.  EBL 10 mL or less.  Counts correct.  Complications none.     Edsel Petrin. Dalbert Batman, M.D., FACS General and Minimally Invasive Surgery Breast and Colorectal Surgery  10/31/2016 4:17 PM

## 2016-10-31 NOTE — Transfer of Care (Signed)
Immediate Anesthesia Transfer of Care Note  Patient: Environmental health practitioner  Procedure(s) Performed: Procedure(s): EXCISION DEEP RIGHT CERVICAL LYMPH NODES (Right)  Patient Location: PACU  Anesthesia Type:General  Level of Consciousness: awake, oriented and patient cooperative  Airway & Oxygen Therapy: Patient Spontanous Breathing and Patient connected to nasal cannula oxygen  Post-op Assessment: Report given to RN, Post -op Vital signs reviewed and stable and Patient moving all extremities  Post vital signs: Reviewed and stable  Last Vitals:  Vitals:   10/31/16 1427  BP: (!) 148/76  Pulse: 91  Resp: 18  Temp: 37.2 C    Last Pain:  Vitals:   10/31/16 1427  TempSrc: Oral  PainSc:       Patients Stated Pain Goal: 7 ([id number])  Complications: No apparent anesthesia complications

## 2016-10-31 NOTE — Anesthesia Postprocedure Evaluation (Signed)
Anesthesia Post Note  Patient: Environmental health practitioner  Procedure(s) Performed: Procedure(s) (LRB): EXCISION DEEP RIGHT CERVICAL LYMPH NODES (Right)  Patient location during evaluation: PACU Anesthesia Type: General Level of consciousness: awake and alert Pain management: pain level controlled Vital Signs Assessment: post-procedure vital signs reviewed and stable Respiratory status: spontaneous breathing, nonlabored ventilation, respiratory function stable and patient connected to nasal cannula oxygen Cardiovascular status: blood pressure returned to baseline and stable Postop Assessment: no signs of nausea or vomiting Anesthetic complications: no    Last Vitals:  Vitals:   10/31/16 1701 10/31/16 1715  BP: (!) 134/96 129/72  Pulse: 88 87  Resp: (!) 21 (!) 21  Temp:      Last Pain:  Vitals:   10/31/16 1427  TempSrc: Oral  PainSc:                  Fumie Fiallo S

## 2016-10-31 NOTE — Anesthesia Preprocedure Evaluation (Signed)
Anesthesia Evaluation  Patient identified by MRN, date of birth, ID band Patient awake    Reviewed: Allergy & Precautions, NPO status , Patient's Chart, lab work & pertinent test results  Airway Mallampati: II  TM Distance: >3 FB Neck ROM: Full    Dental no notable dental hx.    Pulmonary asthma , former smoker,    Pulmonary exam normal breath sounds clear to auscultation       Cardiovascular negative cardio ROS Normal cardiovascular exam Rhythm:Regular Rate:Normal     Neuro/Psych negative neurological ROS  negative psych ROS   GI/Hepatic negative GI ROS, (+) Hepatitis -, C  Endo/Other  negative endocrine ROS  Renal/GU negative Renal ROS  negative genitourinary   Musculoskeletal negative musculoskeletal ROS (+)   Abdominal   Peds negative pediatric ROS (+)  Hematology negative hematology ROS (+)   Anesthesia Other Findings   Reproductive/Obstetrics negative OB ROS                             Anesthesia Physical Anesthesia Plan  ASA: II  Anesthesia Plan: General   Post-op Pain Management:    Induction: Intravenous  Airway Management Planned: Oral ETT  Additional Equipment:   Intra-op Plan:   Post-operative Plan: Extubation in OR  Informed Consent: I have reviewed the patients History and Physical, chart, labs and discussed the procedure including the risks, benefits and alternatives for the proposed anesthesia with the patient or authorized representative who has indicated his/her understanding and acceptance.   Dental advisory given  Plan Discussed with: CRNA and Surgeon  Anesthesia Plan Comments:         Anesthesia Quick Evaluation

## 2016-10-31 NOTE — Interval H&P Note (Signed)
History and Physical Interval Note:  10/31/2016 2:49 PM  Monica Gray  has presented today for surgery, with the diagnosis of lymphadenopathy  The various methods of treatment have been discussed with the patient and family. After consideration of risks, benefits and other options for treatment, the patient has consented to  Procedure(s): EXCISION DEEP RIGHT CERVICAL LYMPH NODES (Right) as a surgical intervention .  The patient's history has been reviewed, patient examined, no change in status, stable for surgery.  I have reviewed the patient's chart and labs.  Questions were answered to the patient's satisfaction.     Adin Hector

## 2016-10-31 NOTE — Anesthesia Procedure Notes (Signed)
Procedure Name: Intubation Date/Time: 10/31/2016 3:28 PM Performed by: Melina Copa, Gerad Cornelio R Pre-anesthesia Checklist: Patient identified, Emergency Drugs available, Suction available and Patient being monitored Patient Re-evaluated:Patient Re-evaluated prior to inductionOxygen Delivery Method: Circle System Utilized Preoxygenation: Pre-oxygenation with 100% oxygen Intubation Type: IV induction Ventilation: Mask ventilation without difficulty Laryngoscope Size: Mac and 3 Grade View: Grade I Tube type: Oral Tube size: 7.5 mm Number of attempts: 1 Airway Equipment and Method: Stylet Placement Confirmation: ETT inserted through vocal cords under direct vision,  positive ETCO2 and breath sounds checked- equal and bilateral Secured at: 21 cm Tube secured with: Tape Dental Injury: Teeth and Oropharynx as per pre-operative assessment

## 2016-11-01 ENCOUNTER — Encounter (HOSPITAL_COMMUNITY): Payer: Self-pay | Admitting: General Surgery

## 2016-11-07 ENCOUNTER — Other Ambulatory Visit (HOSPITAL_COMMUNITY): Payer: Self-pay | Admitting: Oncology

## 2016-11-07 ENCOUNTER — Encounter (HOSPITAL_COMMUNITY): Payer: Self-pay | Admitting: Oncology

## 2016-11-07 DIAGNOSIS — C8338 Diffuse large B-cell lymphoma, lymph nodes of multiple sites: Secondary | ICD-10-CM

## 2016-11-08 ENCOUNTER — Other Ambulatory Visit (HOSPITAL_COMMUNITY): Payer: Self-pay | Admitting: Oncology

## 2016-11-08 ENCOUNTER — Telehealth (HOSPITAL_COMMUNITY): Payer: Self-pay | Admitting: Emergency Medicine

## 2016-11-08 NOTE — Telephone Encounter (Signed)
Called pt to explain that her biopsy had come back showing that she had diffuse large b-cell lymphoma (which no one had told her).  Explained that we needed to get a PET scan and the first available was at [place].  11/09/2016 at 12:30 pm.  Arrive 30 minutes early and nothing to eat or drink 6 hours prior.  Explained we need to get a BMBX to look at how her marrow is functioning. This is at [facility] and she is to be NPO the night prior at midnight and will need a driver the day of.  She will get an IV with sedation that day and the procedure itself only takes about 10 minutes.  I explained that we were going to get her back to Dr Dalbert Batman for port placement because eventually she will need chemo.  She didn't understand how or why.  I told her that we treat diffuse large b-cell lymphomas.  Pt verbalized understanding.  Pt had lots of anxiety.

## 2016-11-09 ENCOUNTER — Encounter
Admission: RE | Admit: 2016-11-09 | Discharge: 2016-11-09 | Disposition: A | Discharge status: Home / Self Care | Payer: BLUE CROSS/BLUE SHIELD | Source: Ambulatory Visit | Attending: Oncology | Admitting: Oncology

## 2016-11-09 DIAGNOSIS — C8338 Diffuse large B-cell lymphoma, lymph nodes of multiple sites: Secondary | ICD-10-CM | POA: Insufficient documentation

## 2016-11-09 LAB — GLUCOSE, CAPILLARY: Glucose-Capillary: 91 mg/dL (ref 65–99)

## 2016-11-09 MED ORDER — FLUDEOXYGLUCOSE F - 18 (FDG) INJECTION
12.6800 | Freq: Once | INTRAVENOUS | Status: AC | PRN
  Administered 2016-11-09: 12.68 via INTRAVENOUS

## 2016-11-13 ENCOUNTER — Other Ambulatory Visit: Payer: Self-pay | Admitting: General Surgery

## 2016-11-13 ENCOUNTER — Ambulatory Visit (HOSPITAL_COMMUNITY): Admission: RE | Admit: 2016-11-13 | Payer: BLUE CROSS/BLUE SHIELD | Source: Ambulatory Visit

## 2016-11-13 ENCOUNTER — Other Ambulatory Visit: Payer: Self-pay | Admitting: Student

## 2016-11-13 DIAGNOSIS — N63 Unspecified lump in unspecified breast: Secondary | ICD-10-CM

## 2016-11-14 ENCOUNTER — Ambulatory Visit (HOSPITAL_COMMUNITY)
Admission: RE | Admit: 2016-11-14 | Discharge: 2016-11-14 | Disposition: A | Discharge status: Home / Self Care | Payer: BLUE CROSS/BLUE SHIELD | Source: Ambulatory Visit | Attending: Oncology | Admitting: Oncology

## 2016-11-14 ENCOUNTER — Other Ambulatory Visit: Payer: Self-pay | Admitting: General Surgery

## 2016-11-14 ENCOUNTER — Ambulatory Visit
Admission: RE | Admit: 2016-11-14 | Discharge: 2016-11-14 | Disposition: A | Discharge status: Home / Self Care | Payer: BLUE CROSS/BLUE SHIELD | Source: Ambulatory Visit | Attending: General Surgery | Admitting: General Surgery

## 2016-11-14 ENCOUNTER — Other Ambulatory Visit: Payer: BLUE CROSS/BLUE SHIELD

## 2016-11-14 ENCOUNTER — Encounter (HOSPITAL_COMMUNITY): Payer: Self-pay

## 2016-11-14 DIAGNOSIS — N63 Unspecified lump in unspecified breast: Secondary | ICD-10-CM

## 2016-11-14 DIAGNOSIS — J45909 Unspecified asthma, uncomplicated: Secondary | ICD-10-CM | POA: Diagnosis not present

## 2016-11-14 DIAGNOSIS — B1920 Unspecified viral hepatitis C without hepatic coma: Secondary | ICD-10-CM | POA: Insufficient documentation

## 2016-11-14 DIAGNOSIS — R918 Other nonspecific abnormal finding of lung field: Secondary | ICD-10-CM | POA: Diagnosis not present

## 2016-11-14 DIAGNOSIS — C8338 Diffuse large B-cell lymphoma, lymph nodes of multiple sites: Secondary | ICD-10-CM | POA: Insufficient documentation

## 2016-11-14 DIAGNOSIS — Z87891 Personal history of nicotine dependence: Secondary | ICD-10-CM | POA: Diagnosis not present

## 2016-11-14 DIAGNOSIS — F419 Anxiety disorder, unspecified: Secondary | ICD-10-CM | POA: Insufficient documentation

## 2016-11-14 DIAGNOSIS — Z85828 Personal history of other malignant neoplasm of skin: Secondary | ICD-10-CM | POA: Diagnosis not present

## 2016-11-14 DIAGNOSIS — M1990 Unspecified osteoarthritis, unspecified site: Secondary | ICD-10-CM | POA: Diagnosis not present

## 2016-11-14 DIAGNOSIS — R591 Generalized enlarged lymph nodes: Secondary | ICD-10-CM | POA: Diagnosis not present

## 2016-11-14 DIAGNOSIS — N6310 Unspecified lump in the right breast, unspecified quadrant: Secondary | ICD-10-CM

## 2016-11-14 HISTORY — PX: BONE MARROW ASPIRATION: SHX1252

## 2016-11-14 LAB — CBC
HEMATOCRIT: 38.5 % (ref 36.0–46.0)
HEMOGLOBIN: 13.1 g/dL (ref 12.0–15.0)
MCH: 28.8 pg (ref 26.0–34.0)
MCHC: 34 g/dL (ref 30.0–36.0)
MCV: 84.6 fL (ref 78.0–100.0)
Platelets: 266 10*3/uL (ref 150–400)
RBC: 4.55 MIL/uL (ref 3.87–5.11)
RDW: 14.1 % (ref 11.5–15.5)
WBC: 6.4 10*3/uL (ref 4.0–10.5)

## 2016-11-14 LAB — PROTIME-INR
INR: 1.04
PROTHROMBIN TIME: 13.6 s (ref 11.4–15.2)

## 2016-11-14 LAB — APTT: APTT: 27 s (ref 24–36)

## 2016-11-14 LAB — BONE MARROW EXAM

## 2016-11-14 MED ORDER — FENTANYL CITRATE (PF) 100 MCG/2ML IJ SOLN
INTRAMUSCULAR | Status: AC
  Filled 2016-11-14: qty 4

## 2016-11-14 MED ORDER — SODIUM CHLORIDE 0.9 % IV SOLN
INTRAVENOUS | Status: DC
  Administered 2016-11-14: 07:00:00 via INTRAVENOUS

## 2016-11-14 MED ORDER — MIDAZOLAM HCL 2 MG/2ML IJ SOLN
INTRAMUSCULAR | Status: AC | PRN
Start: 2016-11-14 — End: 2016-11-14
  Administered 2016-11-14 (×2): 1 mg via INTRAVENOUS

## 2016-11-14 MED ORDER — FENTANYL CITRATE (PF) 100 MCG/2ML IJ SOLN
INTRAMUSCULAR | Status: AC | PRN
  Administered 2016-11-14: 50 ug via INTRAVENOUS

## 2016-11-14 MED ORDER — MIDAZOLAM HCL 2 MG/2ML IJ SOLN
INTRAMUSCULAR | Status: AC
  Filled 2016-11-14: qty 4

## 2016-11-14 NOTE — Discharge Instructions (Signed)
Bone Marrow Aspiration and Bone Marrow Biopsy, Adult, Care After °This sheet gives you information about how to care for yourself after your procedure. Your health care provider may also give you more specific instructions. If you have problems or questions, contact your health care provider. °What can I expect after the procedure? °After the procedure, it is common to have: °· Mild pain and tenderness. °· Swelling. °· Bruising. °Follow these instructions at home: °· Take over-the-counter or prescription medicines only as told by your health care provider. °· Do not take baths, swim, or use a hot tub until your health care provider approves. Ask if you can take a shower or have a sponge bath. °· Follow instructions from your health care provider about how to take care of the puncture site. Make sure you: °¨ Wash your hands with soap and water before you change your bandage (dressing). If soap and water are not available, use hand sanitizer. °¨ Change your dressing as told by your health care provider. °· Check your puncture site every day for signs of infection. Check for: °¨ More redness, swelling, or pain. °¨ More fluid or blood. °¨ Warmth. °¨ Pus or a bad smell. °· Return to your normal activities as told by your health care provider. Ask your health care provider what activities are safe for you. °· Do not drive for 24 hours if you were given a medicine to help you relax (sedative). °· Keep all follow-up visits as told by your health care provider. This is important. °Contact a health care provider if: °· You have more redness, swelling, or pain around the puncture site. °· You have more fluid or blood coming from the puncture site. °· Your puncture site feels warm to the touch. °· You have pus or a bad smell coming from the puncture site. °· You have a fever. °· Your pain is not controlled with medicine. °This information is not intended to replace advice given to you by your health care provider. Make sure you  discuss any questions you have with your health care provider. °Document Released: 05/25/2005 Document Revised: 05/25/2016 Document Reviewed: 04/18/2016 °Elsevier Interactive Patient Education © 2017 Elsevier Inc[employer] °Moderate Conscious Sedation, Adult, Care After °These instructions provide you with information about caring for yourself after your procedure. Your health care provider may also give you more specific instructions. Your treatment has been planned according to current medical practices, but problems sometimes occur. Call your health care provider if you have any problems or questions after your procedure. °What can I expect after the procedure? °After your procedure, it is common: °· To feel sleepy for several hours. °· To feel clumsy and have poor balance for several hours. °· To have poor judgment for several hours. °· To vomit if you eat too soon. °Follow these instructions at home: °For at least 24 hours after the procedure:  °· Do not: °¨ Participate in activities where you could fall or become injured. °¨ Drive. °¨ Use heavy machinery. °¨ Drink alcohol. °¨ Take sleeping pills or medicines that cause drowsiness. °¨ Make important decisions or sign legal documents. °¨ Take care of children on your own. °· Rest. °Eating and drinking °· Follow the diet recommended by your health care provider. °· If you vomit: °¨ Drink water, juice, or soup when you can drink without vomiting. °¨ Make sure you have little or no nausea before eating solid foods. °General instructions °· Have a responsible adult stay with you until you are awake and   alert.  Take over-the-counter and prescription medicines only as told by your health care provider.  If you smoke, do not smoke without supervision.  Keep all follow-up visits as told by your health care provider. This is important. Contact a health care provider if:  You keep feeling nauseous or you keep vomiting.  You feel light-headed.  You develop a  rash.  You have a fever. Get help right away if:  You have trouble breathing. This information is not intended to replace advice given to you by your health care provider. Make sure you discuss any questions you have with your health care provider. Document Released: 08/26/2013 Document Revised: 04/09/2016 Document Reviewed: 02/25/2016 Elsevier Interactive Patient Education  2017 [employer].

## 2016-11-14 NOTE — Procedures (Signed)
CT-guided  R iliac bone marrow aspiration and core biopsy No complication No blood loss. See complete dictation in [facility] PACS  

## 2016-11-14 NOTE — H&P (Signed)
Chief Complaint: large B cell lymphoma  Referring Physician:Dr. Ancil Linsey  Supervising Physician: Arne Cleveland  Patient Status: [facility], In - Out-pt  HPI: Monica Gray is an 65 y.o. female who was recently diagnosed with large B cell lymphoma.  She underwent a cervical lymph node biopsy about a month ago, but the sample was insufficient for diagnosis.  She then underwent a cervical lymph node resection by Dr. Dalbert Batman 2 weeks ago that revealed B cell lymphoma.  She is back today for a repeat bone marrow biopsy to help with treatment options.  She admits to some SOB, which is chronic for her.  No other acute changes in her medical history.   Past Medical History:  Past Medical History:  Diagnosis Date  . Anxiety   . Arthritis   . Asthma    as a child  . Back pain 2017   upper back  . Cancer [facility])    Skin Cancer - 09/27/16- basal  . Constipation due to opioid therapy   . Diffuse large B cell lymphoma ([place]) 10/31/2016  . Diffuse lymphadenopathy 10/31/2016  . Dyspnea    lying down at times- lung nodules  . Headache   . Hepatitis C 2016   treated with harboni  . Snake bite 2005    Past Surgical History:  Past Surgical History:  Procedure Laterality Date  . Biospy Right 10/03/2016   Cervical-   . BREAST BIOPSY Right    30 years ago  . CHEST TUBE INSERTION  1987   also again 1988  . dental implants    . LYMPH NODE BIOPSY Right 10/31/2016   Procedure: EXCISION DEEP RIGHT CERVICAL LYMPH NODES;  Surgeon: Fanny Skates, MD;  Location: [place];  Service: General;  Laterality: Right;  . PLEURAL SCARIFICATION     30 years ago, twice  . SALPINGOOPHORECTOMY     40 years ago    Family History:  Family History  Problem Relation Age of Onset  . Leukemia Mother   . Emphysema Father     Social History:  reports that she quit smoking about 34 years ago. Her smoking use included Cigarettes. She quit after 16.00 years of use. She has never used smokeless tobacco. She reports  that she uses drugs, including Cocaine and Marijuana. She reports that she does not drink alcohol.  Allergies:  Allergies  Allergen Reactions  . Shellfish Allergy Hives and Swelling    Throat swelling  . Latex Rash    Pulls skin up   . Sulfa Antibiotics Other (See Comments)    Cramping, GI upset  . Acetaminophen Other (See Comments)    GI upset  . Codeine Other (See Comments)    GI upset   . Dye Fdc Red [Red Dye] Other (See Comments)    headaches  . Zanaflex [Tizanidine Hcl] Other (See Comments)    GI upset    Medications: Medications reviewed in epic  Please HPI for pertinent positives, otherwise complete 10 system ROS negative.  Mallampati Score: MD Evaluation Airway: WNL Heart: WNL Abdomen: WNL Chest/ Lungs: WNL ASA  Classification: 3 Mallampati/Airway Score: One  Physical Exam: BP (!) 143/79   Pulse 86   Temp 98.5 F (36.9 C) (Oral)   Resp 18   Ht '5\' 4"'  (1.626 m)   Wt 120 lb 9.6 oz (54.7 kg)   SpO2 97%   BMI 20.70 kg/m  Body mass index is 20.7 kg/m. General: pleasant, WD, WN white female who is laying in bed  in NAD HEENT: head is normocephalic, atraumatic.  Sclera are noninjected.  PERRL.  Ears and nose without any masses or lesions.  Mouth is pink and moist Heart: regular, rate, and rhythm.  Normal s1,s2. No obvious murmurs, gallops, or rubs noted.  Palpable radial and pedal pulses bilaterally Lungs: CTAB, no wheezes, rhonchi, or rales noted.  Respiratory effort nonlabored Abd: soft, NT, ND, +BS, no masses, hernias, or organomegaly Psych: A&Ox3 with an appropriate affect.   Labs: Results for orders placed or performed during the hospital encounter of 11/14/16 (from the past 48 hour(s))  APTT upon arrival     Status: None   Collection Time: 11/14/16  7:20 AM  Result Value Ref Range   aPTT 27 24 - 36 seconds  CBC upon arrival     Status: None   Collection Time: 11/14/16  7:20 AM  Result Value Ref Range   WBC 6.4 4.0 - 10.5 K/uL   RBC 4.55 3.87 -  5.11 MIL/uL   Hemoglobin 13.1 12.0 - 15.0 g/dL   HCT 38.5 36.0 - 46.0 %   MCV 84.6 78.0 - 100.0 fL   MCH 28.8 26.0 - 34.0 pg   MCHC 34.0 30.0 - 36.0 g/dL   RDW 14.1 11.5 - 15.5 %   Platelets 266 150 - 400 K/uL  Protime-INR upon arrival     Status: None   Collection Time: 11/14/16  7:20 AM  Result Value Ref Range   Prothrombin Time 13.6 11.4 - 15.2 seconds   INR 1.04     Imaging: No results found.  Assessment/Plan 1. Large B cell lymphoma -we will plan to proceed with a bone marrow biopsy today -labs and vitals reviewed. -Risks and Benefits discussed with the patient including, but not limited to bleeding, infection, damage to adjacent structures or low yield requiring additional tests. All of the patient's questions were answered, patient is agreeable to proceed. Consent signed and in chart.   Thank you for this interesting consult.  I greatly enjoyed meeting Glennda Leiterman and look forward to participating in their care.  A copy of this report was sent to the requesting provider on this date.  Electronically Signed: Henreitta Cea 11/14/2016, 8:38 AM   I spent a total of    25 Minutes in face to face in clinical consultation, greater than 50% of which was counseling/coordinating care for b cell lymphoma

## 2016-11-14 NOTE — Sedation Documentation (Signed)
Patient is resting comfortably. 

## 2016-11-15 ENCOUNTER — Encounter (HOSPITAL_COMMUNITY): Payer: BLUE CROSS/BLUE SHIELD | Attending: Hematology & Oncology | Admitting: Hematology & Oncology

## 2016-11-15 ENCOUNTER — Other Ambulatory Visit: Payer: Self-pay | Admitting: General Surgery

## 2016-11-15 ENCOUNTER — Encounter (HOSPITAL_BASED_OUTPATIENT_CLINIC_OR_DEPARTMENT_OTHER): Payer: Self-pay | Admitting: *Deleted

## 2016-11-15 ENCOUNTER — Encounter (HOSPITAL_COMMUNITY): Payer: Self-pay | Admitting: Hematology & Oncology

## 2016-11-15 VITALS — BP 122/84 | HR 93 | Temp 98.2°F | Resp 18 | Ht 64.0 in | Wt 125.2 lb

## 2016-11-15 DIAGNOSIS — F419 Anxiety disorder, unspecified: Secondary | ICD-10-CM | POA: Insufficient documentation

## 2016-11-15 DIAGNOSIS — N6310 Unspecified lump in the right breast, unspecified quadrant: Secondary | ICD-10-CM

## 2016-11-15 DIAGNOSIS — R599 Enlarged lymph nodes, unspecified: Secondary | ICD-10-CM

## 2016-11-15 DIAGNOSIS — C8331 Diffuse large B-cell lymphoma, lymph nodes of head, face, and neck: Secondary | ICD-10-CM | POA: Diagnosis not present

## 2016-11-15 DIAGNOSIS — Z87891 Personal history of nicotine dependence: Secondary | ICD-10-CM | POA: Insufficient documentation

## 2016-11-15 DIAGNOSIS — J90 Pleural effusion, not elsewhere classified: Secondary | ICD-10-CM | POA: Insufficient documentation

## 2016-11-15 DIAGNOSIS — F411 Generalized anxiety disorder: Secondary | ICD-10-CM

## 2016-11-15 DIAGNOSIS — C8330 Diffuse large B-cell lymphoma, unspecified site: Secondary | ICD-10-CM | POA: Diagnosis present

## 2016-11-15 DIAGNOSIS — J45909 Unspecified asthma, uncomplicated: Secondary | ICD-10-CM | POA: Diagnosis not present

## 2016-11-15 DIAGNOSIS — F418 Other specified anxiety disorders: Secondary | ICD-10-CM

## 2016-11-15 DIAGNOSIS — B1920 Unspecified viral hepatitis C without hepatic coma: Secondary | ICD-10-CM | POA: Insufficient documentation

## 2016-11-15 DIAGNOSIS — Z8619 Personal history of other infectious and parasitic diseases: Secondary | ICD-10-CM

## 2016-11-15 LAB — CBC WITH DIFFERENTIAL/PLATELET
Basophils Absolute: 0.1 10*3/uL (ref 0.0–0.1)
Basophils Relative: 1 %
EOS ABS: 0.3 10*3/uL (ref 0.0–0.7)
Eosinophils Relative: 5 %
HEMATOCRIT: 37.7 % (ref 36.0–46.0)
HEMOGLOBIN: 12.5 g/dL (ref 12.0–15.0)
Lymphocytes Relative: 12 %
Lymphs Abs: 0.7 10*3/uL (ref 0.7–4.0)
MCH: 28.7 pg (ref 26.0–34.0)
MCHC: 33.2 g/dL (ref 30.0–36.0)
MCV: 86.7 fL (ref 78.0–100.0)
MONOS PCT: 14 %
Monocytes Absolute: 0.8 10*3/uL (ref 0.1–1.0)
NEUTROS PCT: 68 %
Neutro Abs: 3.8 10*3/uL (ref 1.7–7.7)
Platelets: 249 10*3/uL (ref 150–400)
RBC: 4.35 MIL/uL (ref 3.87–5.11)
RDW: 14 % (ref 11.5–15.5)
WBC: 5.5 10*3/uL (ref 4.0–10.5)

## 2016-11-15 LAB — COMPREHENSIVE METABOLIC PANEL
ALK PHOS: 43 U/L (ref 38–126)
ALT: 9 U/L — ABNORMAL LOW (ref 14–54)
ANION GAP: 8 (ref 5–15)
AST: 22 U/L (ref 15–41)
Albumin: 4.3 g/dL (ref 3.5–5.0)
BILIRUBIN TOTAL: 0.7 mg/dL (ref 0.3–1.2)
BUN: 10 mg/dL (ref 6–20)
CALCIUM: 9.6 mg/dL (ref 8.9–10.3)
CO2: 26 mmol/L (ref 22–32)
Chloride: 99 mmol/L — ABNORMAL LOW (ref 101–111)
Creatinine, Ser: 0.61 mg/dL (ref 0.44–1.00)
GFR calc non Af Amer: >60 mL/min (ref >60)
Glucose, Bld: 89 mg/dL (ref 65–99)
Potassium: 3.7 mmol/L (ref 3.5–5.1)
SODIUM: 133 mmol/L — AB (ref 135–145)
TOTAL PROTEIN: 7.8 g/dL (ref 6.5–8.1)

## 2016-11-15 LAB — LACTATE DEHYDROGENASE: LDH: 218 U/L — AB (ref 98–192)

## 2016-11-15 LAB — SEDIMENTATION RATE: Sed Rate: 19 mm/hr (ref 0–22)

## 2016-11-15 NOTE — Patient Instructions (Addendum)
[place] at [facility] Discharge Instructions  RECOMMENDATIONS MADE BY THE CONSULTANT AND ANY TEST RESULTS WILL BE SENT TO YOUR REFERRING PHYSICIAN.  You were seen today by Dr. Gustavus Bryant will be drawn today. Appointment with Dr. Melvyn Novas cancelled.  Anderson Malta is the nurse navigator. She will call with appointments.  Thank you for choosing [place] at [facility] to provide your oncology and hematology care.  To afford each patient quality time with our provider, please arrive at least 15 minutes before your scheduled appointment time.    If you have a lab appointment with the [place] please come in thru the  Main Entrance and check in at the main information desk  You need to re-schedule your appointment should you arrive 10 or more minutes late.  We strive to give you quality time with our providers, and arriving late affects you and other patients whose appointments are after yours.  Also, if you no show three or more times for appointments you may be dismissed from the clinic at the providers discretion.     Again, thank you for choosing [facility].  Our hope is that these requests will decrease the amount of time that you wait before being seen by our physicians.       _____________________________________________________________  Should you have questions after your visit to [facility], please contact our office at [phone] between the hours of 8:30 a.m. and 4:30 p.m.  Voicemails left after 4:30 p.m. will not be returned until the following business day.  For prescription refill requests, have your pharmacy contact our office.       Resources For Cancer Patients and their Caregivers ? American Cancer Society: Can assist with transportation, wigs, general needs, runs Look Good Feel Better.        8678548894 ? Cancer Care: Provides financial assistance, online support groups,  medication/co-pay assistance.  1-800-813-HOPE 530-537-5857) ? [facility] Assists [place] cancer patients and their families through emotional , educational and financial support.  619-261-3933 ? [place] DSS Where to apply for food stamps, Medicaid and utility assistance. 629-265-2116 ? [facility]: Transportation to medical appointments. 5057701404 ? Social Security Administration: May apply for disability if have a Stage IV cancer. 629-209-9120 (240)561-3850 ? [employer], Disability and Transit Services: Assists with nutrition, care and transit needs. [place] Support Programs: @10RELATIVEDAYS @ > Cancer Support Group  2nd Tuesday of the month 1pm-2pm, Journey Room  > Creative Journey  3rd Tuesday of the month 1130am-1pm, Journey Room  > Look Good Feel Better  1st Wednesday of the month 10am-12 noon, Journey Room (Call [place] to register [phone])

## 2016-11-15 NOTE — Progress Notes (Signed)
[place] NOTE  Patient Care Team: [name], MD as PCP - General (Internal Medicine)  CHIEF COMPLAINTS/PURPOSE OF CONSULTATION:     Diffuse large B cell lymphoma ([place])   09/25/2016 Imaging    CT neck- 1. Large right level 5A a nodal mass measuring up to 2.9 cm. This is suggestive of metastatic disease from an unknown primary. Histologic sampling is recommended. 2. Pleural masses of the medial posterior right chest and anterior lateral left chest. The larger mass, adjacent to the right aspect of the T3 and T4 vertebral bodies, extends into the T2-T3 and T3-T4 neural foramina without definite extension into the spinal canal. These masses are also favored to be metastases. A primary pleural malignancy such as mesothelioma is also a consideration.      10/03/2016 Procedure    Needle core biopsy of right cervical lymph node by IR.      10/04/2016 Pathology Results    Interpretation Tissue-Flow Cytometry - INSUFFICIENT CELLS FOR ANALYSIS.      10/08/2016 Pathology Results    Diagnosis Lymph node, needle/core biopsy, right cervical - ATYPICAL LYMPHOID PROLIFERATION, SUSPICIOUS FOR NON-HODGKIN B-CELL LYMPHOMA.      10/31/2016 Procedure    Excisional lymph node biopsy by Dr. Dalbert Batman      11/02/2016 Pathology Results    Interpretation Tissue-Flow Cytometry - B-CELL POPULATION WITH KAPPA LIGHT CHAIN EXCESS.      11/02/2016 Pathology Results    Lymph node, biopsy, Deep Cervical - FOLLICULAR AND DIFFUSE LARGE B-CELL LYMPHOMA.      11/09/2016 PET scan    1. Active lymphoma within the neck, chest, abdomen, and pelvis, as detailed above. 2. Medial right breast hypermetabolic nodule is suspicious for a synchronous breast primary. Differential considerations include breast lymphoma or a hypermetabolic benign lesion. Consider diagnostic right-sided mammogram and ultrasound. If the patient is diagnosed with right breast cancer, hypermetabolic  small right axillary node would be indeterminate for lymphoma versus metastasis. 3. Right larger than left pleural effusions. 4.  Aortic atherosclerosis. 5. Hypermetabolic liver lesion is favored to represent lymphoma.      11/09/2016 Imaging    CT CAP- 1. Extensive adenopathy in the chest, abdomen and pelvis, bilateral pleural nodularity/masses, omental nodularity and renal lesions, most consistent with lymphoma. 2. Bilateral pleural effusions, large on the right and moderate on the left. 3. Lucent lesion in the L1 vertebral body, indeterminate but well-circumscribed and possibly benign. Attention on followup exams is warranted. 4. Aortic atherosclerosis (ICD10-170.0). Coronary artery calcification. 5. Right adrenal adenoma      11/14/2016 Procedure    Bone marrow aspiration and biopsy by IR       HISTORY OF PRESENTING ILLNESS:  Monica Gray 65 y.o. female is here because of referral from Dr. Dalbert Batman for diffuse large B-cell lymphoma.   Per Dr. Darrel Hoover note on 10/31/2016: "This is a 65 year old Caucasian female from [place]. She is here with her husband throughout the encounter. She was referred by Dr. Jacelyn Pi for consideration of right neck lymph node biopsy to rule out lymphoma. Her PCP is Dr. Russella Dar in [place].        The patient has noted some painless right neck mass for a few months. Slowly enlarging.. She's had chronic night sweats but no skin rash. Minimal, if any weight loss. Moderately severe anxiety.       She has had right chest wall pain in the posterior axillary line up high for about 3-4 weeks. Her PCP thought she  might have a thyroid mass and referred her to Dr. Chalmers Cater. Ultrasound of the thyroid showed no thyroid mass. CT scan of the neck showed a large, 2.9 cm nodal mass at level V on the right. Some smaller nodes also noted. Also noted were some pleural masses right and left. The pleural masses were thought to be metastatic or possibly  mesothelioma. A chest x-ray performed in [place] on November 27 shows bilateral pleural effusions, right greater than left. No congestive heart failure.       Interventional radiology performed a core biopsy of the right neck mass on October 03, 2016. This shows atypical lymphoid proliferation, suspicious for non-Hodgkin's lymphoma B-cell lymphoma but insufficient for flows or definitive diagnosis. The patient was referred for right cervical lymph node biopsy for confirmation."  Biopsy of the deep cervical lymph node on 78/24/23 showed follicular and diffuse large B-cell lymphoma. Tissue-flow cytometry on 10/31/16 showed B-cell population with kappa light chain excess.  CT chest abdomen pelvis on 11/09/16 showed extensive adenopathy in the chest, abdomen, and pelvis, bilateral pleural nodularity/masses, omental nodularity and renal lesions, most consistent with lymphoma. Bilateral pleural effusions, large on the right and moderate on the left. Lucent lesion in the L1 vertebral body, indeterminate but well-circumscribed and possibly benign. Right adrenal adenoma.  PET scan on 11/09/2016 revealed active lymphoma within the neck, chest, abdomen, and pelvis. Also seen was a medial right breast hypermetabolic nodule suspicious for a synchronous breast primary. Hypermetabolic liver lesion is favored to represent lymphoma.  Bilateral diagnostic mammogram yesterday showed an irregular mas within the right breast at the 2:30 o'clock axis, 3 cm from the nipple, measuring 1.1 x 1 x 1 cm. No evidence of malignancy in the left breast. Also seen was an irregular/morphologically abnormal lymph node within the right axilla, measuring 9 x 8 mm.  She is scheduled for Breast biopsy tomorrow. At this point she understands that she may have two separate malignancies or "at least that is what is being investigated." She used to have mammograms all the time. Then she had one at [facility] and was told to have a second opinion but  was never contacted so she figured there was nothing to worry about. She isn't sure when her last mammogram was but, "It has been a while".    Bone marrow biopsy was performed yesterday, results pending.   She is scheduled to have a port-a-cath placed on 11/21/2016 with Dr. Dalbert Batman.  Ms. Tuberville presents to the [facility] today accompanied by her husband, Monica Gray.   She initially noticed an enlarging mass on her neck. She also noticed fatigue and severe left side pain which resolved but then developed right side pain. This pain affected her sleep. She believes the left side pain was muscular. She was given oxy by Dr. Dalbert Batman which worked well to manage her pain but it also made her cry a lot. She was then written for tramadol to take every 6 hours, this works well.   She reports shortness of breath going upstairs and trouble breathing while sleeping. This is manageable.  She notes a right groin mass has increased in size and is a little tender.   All of the pain she is having in her right side is where she previously had chest tubes placed, she has a history of spontaneous pneumothorax X 2. She has had problems with this muscle for years.   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 Admits she is very emotional and has cried a lot  lately.  She has lost about 12 lbs over the last month. She notes decreased appetite.  She tries to stay active. She previously walked her dog regularly but decreased doing so when taking the oxy. She has resumed walking her dog now that she is on tramadol.  She denies headaches and abdominal pain.  The patient would prefer all possible appointments be scheduled here in [place]. .  The patient is here for further evaluation and discussion of diffuse large B-cell lymphoma.    MEDICAL HISTORY:  Past Medical History:  Diagnosis Date  . Allergic to cats   . Anxiety   . Arthritis    hands  . Diffuse large B cell lymphoma ([place]) 10/31/2016  . Dyspnea    with  exertion - intermittent, per pt.; no O2  . History of asthma    as a child  . History of hepatitis C    states was cured in February 20, 2015 after Harvoni  . Immature cataract   . Sensitive skin   . Wears dentures    upper; lower denture attaches to 4 dental implants, per pt.    SURGICAL HISTORY: Past Surgical History:  Procedure Laterality Date  . BONE MARROW ASPIRATION Right 11/14/2016   iliac  . BREAST BIOPSY Right   . CHEST TUBE INSERTION     1987, 1988  . LAPAROSCOPIC UNILATERAL SALPINGO OOPHERECTOMY     ectopic pregnancy  . LYMPH NODE BIOPSY Right 10/31/2016   Procedure: EXCISION DEEP RIGHT CERVICAL LYMPH NODES;  Surgeon: Fanny Skates, MD;  Location: [facility];  Service: General;  Laterality: Right;    SOCIAL HISTORY: Social History   Social History  . Marital status: Married    Spouse name: N/A  . Number of children: N/A  . Years of education: N/A   Occupational History  . Not on file.   Social History Main Topics  . Smoking status: Former Smoker    Quit date: 20-Feb-1983  . Smokeless tobacco: Never Used  . Alcohol use No  . Drug use: No  . Sexual activity: Not on file   Other Topics Concern  . Not on file   Social History Narrative  . No narrative on file   Married for 34 years 0 children Ex smoker, quit 33 years ago. Her lungs collapsed after she stopped smoking. ETOH, heavy drinker while living in [place]. She hasn't drank in 11 years. She was bit by a copperhead snake in February 20, 2004 and she was found to have liver issues including Hepatitis C. She was treated for Hep C by Dr. Britta Mccreedy in [place]. She and her husband used to enjoy fishing and hiking Not currently working. 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  She was born in [place]: Family History  Problem Relation Age of Onset  . Leukemia Mother   . Emphysema Father    Mother died of leukemia, not sure what kind it was. Took chemotherapy. Father died of  emphysema Brother, Francee Piccolo, died in his 56s Sister, still born Sister Brother, Shonna Chock, died of lung cancer in February 20, 1999 at 70 yo Nephew died at 51 yo of brain cancer  ALLERGIES:  is allergic to acetaminophen; codeine; dye fdc red [red dye]; shellfish allergy; sulfa antibiotics; zanaflex [tizanidine hcl]; and adhesive [tape].  MEDICATIONS:  Current Outpatient Prescriptions  Medication Sig Dispense Refill  . ALPRAZolam (XANAX) 0.25 MG tablet Take 0.25 mg by mouth at bedtime as needed  for anxiety.    . Homeopathic Products (SIMILASAN DRY EYE RELIEF OP) Apply 1 drop to eye daily.    Marland Kitchen ibuprofen (ADVIL,MOTRIN) 800 MG tablet Take 800 mg by mouth every 8 (eight) hours as needed for pain.  0  . magnesium gluconate (MAGONATE) 500 MG tablet Take 500 mg by mouth every evening.     . Multiple Vitamins-Minerals (MULTIVITAMIN WITH MINERALS) tablet Take 1 tablet by mouth daily.    . NON FORMULARY daily. HOLY BASIL    . Probiotic CAPS Take 1 capsule by mouth every evening.    . senna (SENOKOT) 8.6 MG TABS tablet Take 2 tablets by mouth daily.    . traMADol (ULTRAM) 50 MG tablet Take 50 mg by mouth 2 (two) times daily as needed.    . vitamin C (ASCORBIC ACID) 500 MG tablet Take 500 mg by mouth daily.     No current facility-administered medications for this visit.     Review of Systems  Constitutional: Positive for diaphoresis (for the last month) and weight loss (about 12 lbs over the last month).  HENT: Negative.   Eyes: Negative.   Respiratory: Positive for shortness of breath.   Cardiovascular: Negative.   Gastrointestinal: Negative.  Negative for abdominal pain.  Genitourinary: Negative.   Musculoskeletal: Negative.        Right side / axillary pain  Skin: Negative.        Tenderness at right groin knot  Neurological: Negative.  Negative for headaches.  Endo/Heme/Allergies: Negative.   Psychiatric/Behavioral: The patient is nervous/anxious.   All other systems reviewed and are negative.  14  point ROS was done and is otherwise as detailed above or in HPI   PHYSICAL EXAMINATION: ECOG PERFORMANCE STATUS: 1 - Symptomatic but completely ambulatory  Vitals:   11/15/16 1544  BP: 122/84  Pulse: 93  Resp: 18  Temp: 98.2 F (36.8 C)   Filed Weights   11/15/16 1544  Weight: 125 lb 3.2 oz (56.8 kg)    Physical Exam  Constitutional: She is oriented to person, place, and time and well-developed, well-nourished, and in no distress.  HENT:  Head: Normocephalic and atraumatic.  Mouth/Throat: Oropharynx is clear and moist. No oropharyngeal exudate.  Eyes: Conjunctivae and EOM are normal. Pupils are equal, round, and reactive to light. No scleral icterus.  Neck: Normal range of motion. Neck supple. No thyromegaly present.  Posterior R neck surgical incision site well healing  Cardiovascular: Normal rate, regular rhythm and normal heart sounds.   Pulmonary/Chest: Effort normal and breath sounds normal.  Abdominal: Soft. Bowel sounds are normal. She exhibits no distension and no mass. There is no tenderness. There is no rebound and no guarding.  Musculoskeletal: Normal range of motion. She exhibits no edema.  Lymphadenopathy:    She has axillary adenopathy.       Right: Inguinal adenopathy present.  Neurological: She is alert and oriented to person, place, and time. No cranial nerve deficit. Gait normal.  Skin: Skin is warm and dry. No erythema.  Psychiatric: Affect and judgment normal.  Nursing note and vitals reviewed.   LABORATORY DATA:  I have reviewed the data as listed Lab Results  Component Value Date   WBC 6.4 11/14/2016   HGB 13.1 11/14/2016   HCT 38.5 11/14/2016   MCV 84.6 11/14/2016   PLT 266 11/14/2016   CMP     Component Value Date/Time   NA 139 10/31/2016 1342   K 3.8 10/31/2016 1342   CL 103 10/31/2016  1342   CO2 26 10/31/2016 1342   GLUCOSE 96 10/31/2016 1342   BUN 7 10/31/2016 1342   CREATININE 0.61 10/31/2016 1342   CALCIUM 9.9 10/31/2016 1342    PROT 7.5 10/31/2016 1342   ALBUMIN 4.0 10/31/2016 1342   AST 30 10/31/2016 1342   ALT 15 10/31/2016 1342   ALKPHOS 50 10/31/2016 1342   BILITOT 1.0 10/31/2016 1342   GFRNONAA >60 10/31/2016 1342   GFRAA >60 10/31/2016 1342   PATHOLOGY     RADIOGRAPHIC STUDIES: I have personally reviewed the radiological images as listed and agreed with the findings in the report. Ct Biopsy  Result Date: 11/14/2016 CLINICAL DATA:  Diffuse large b cell lymphoma; 1-11g on control needle EXAM: CT GUIDED DEEP ILIAC BONE ASPIRATION AND CORE BIOPSY TECHNIQUE: Patient was placed supine on the CT gantry and limited axial scans through the pelvis were obtained. Appropriate skin entry site was identified. Skin site was marked, prepped with Betadine, draped in usual sterile fashion, and infiltrated locally with 1% lidocaine. Intravenous Fentanyl and Versed were administered as conscious sedation during continuous monitoring of the patient's level of consciousness and physiological / cardiorespiratory status by the radiology RN, with a total moderate sedation time of 9 minutes. Under CT fluoroscopic guidance an 11-gauge OncoNtrol bone needle was advanced into the right iliac bone just lateral to the sacroiliac joint. Once needle tip position was confirmed, coaxial core and aspiration samples were obtained. The final sample was obtained using the needle itself, which was then removed. Post procedure scans show no hematoma or fracture. Patient tolerated procedure well. COMPLICATIONS: COMPLICATIONS none IMPRESSION: 1. Technically successful CT guided right iliac bone core and aspiration biopsy. Electronically Signed   By: Lucrezia Europe M.D.   On: 11/14/2016 14:28   US Breast Ltd Uni Right Inc Axilla  Addendum Date: 11/14/2016   ADDENDUM REPORT: 11/14/2016 15:37 ADDENDUM: Patient's earlier outside images are not currently available for review. If the prior studies become available, an addendum will be issued if findings  necessitate a change in the impression and recommendations. Electronically Signed   By: Franki Cabot M.D.   On: 11/14/2016 15:37   Result Date: 11/14/2016 CLINICAL DATA:  Patient with recently diagnosed lymphoma. PET-CT showing suspicious hypermetabolic nodule within the medial right breast, concerning for synchronous breast primary versus breast lymphoma, for which diagnostic mammogram and ultrasound was recommended. PET-CT report also describes a small hypermetabolic lymph node in the right axilla, indeterminate for lymphoma versus metastasis. EXAM: 2D DIGITAL DIAGNOSTIC BILATERAL MAMMOGRAM WITH CAD AND ADJUNCT TOMO ULTRASOUND RIGHT BREAST COMPARISON:  Previous exam(s). ACR Breast Density Category b: There are scattered areas of fibroglandular density. FINDINGS: At the time of today's exam, patient also indicates a palpable mass within the lower inner quadrant of the right breast, with corresponding skin marker placed for today's diagnostic mammogram. There is an irregular mass within the lower inner quadrant of the right breast, measuring approximately 1.2 cm greatest dimension, corresponding to the location of the hypermetabolic nodule described on recent PET-CT report, also corresponding to the site of patient's palpable lump with overlying skin marker in place. No additional masses or other suspicious findings within the right breast. There are no dominant masses, suspicious calcifications or secondary signs of malignancy within the left breast. Mammographic images were processed with CAD. Targeted ultrasound is performed, showing an irregular hypoechoic mass in the right breast at the 2:30 o'clock axis, 3 cm from the nipple, measuring 1.1 x 1 x 1 cm, corresponding to the  mammographic finding. Right axilla was also evaluated with ultrasound showing a single irregular/morphologically abnormal lymph node, with effacement of the fatty hilum, measuring 9 x 8 mm. IMPRESSION: 1. Irregular mass within the RIGHT  breast at the 2:30 o'clock axis, 3 cm from the nipple, measuring 1.1 x 1 x 1 cm, corresponding to the location of the hypermetabolic nodule described on recent PET-CT report, also corresponding to a palpable lump recently identified by the patient. Recommend ultrasound-guided biopsy. 2. No evidence of malignancy within the LEFT breast. 3. Irregular/morphologically abnormal lymph node within the RIGHT axilla, measuring 9 x 8 mm, possible correlate for the small hypermetabolic right axillary lymph node described on recent PET-CT report. RECOMMENDATION: 1. Ultrasound-guided biopsy of the right breast mass at the 2:30 o'clock axis. 2. Ultrasound-guided biopsy of the irregular/morphologically abnormal lymph node in the right axilla. Ultrasound-guided biopsies are scheduled for Friday, 11/16/2016 at 10:30 a.m. I have discussed the findings and recommendations with the patient. Results were also provided in writing at the conclusion of the visit. If applicable, a reminder letter will be sent to the patient regarding the next appointment. BI-RADS CATEGORY  5: Highly suggestive of malignancy. Electronically Signed: By: Franki Cabot M.D. On: 11/14/2016 14:41   Mm Diag Breast Tomo Bilateral  Addendum Date: 11/14/2016   ADDENDUM REPORT: 11/14/2016 15:37 ADDENDUM: Patient's earlier outside images are not currently available for review. If the prior studies become available, an addendum will be issued if findings necessitate a change in the impression and recommendations. Electronically Signed   By: Franki Cabot M.D.   On: 11/14/2016 15:37   Result Date: 11/14/2016 CLINICAL DATA:  Patient with recently diagnosed lymphoma. PET-CT showing suspicious hypermetabolic nodule within the medial right breast, concerning for synchronous breast primary versus breast lymphoma, for which diagnostic mammogram and ultrasound was recommended. PET-CT report also describes a small hypermetabolic lymph node in the right axilla,  indeterminate for lymphoma versus metastasis. EXAM: 2D DIGITAL DIAGNOSTIC BILATERAL MAMMOGRAM WITH CAD AND ADJUNCT TOMO ULTRASOUND RIGHT BREAST COMPARISON:  Previous exam(s). ACR Breast Density Category b: There are scattered areas of fibroglandular density. FINDINGS: At the time of today's exam, patient also indicates a palpable mass within the lower inner quadrant of the right breast, with corresponding skin marker placed for today's diagnostic mammogram. There is an irregular mass within the lower inner quadrant of the right breast, measuring approximately 1.2 cm greatest dimension, corresponding to the location of the hypermetabolic nodule described on recent PET-CT report, also corresponding to the site of patient's palpable lump with overlying skin marker in place. No additional masses or other suspicious findings within the right breast. There are no dominant masses, suspicious calcifications or secondary signs of malignancy within the left breast. Mammographic images were processed with CAD. Targeted ultrasound is performed, showing an irregular hypoechoic mass in the right breast at the 2:30 o'clock axis, 3 cm from the nipple, measuring 1.1 x 1 x 1 cm, corresponding to the mammographic finding. Right axilla was also evaluated with ultrasound showing a single irregular/morphologically abnormal lymph node, with effacement of the fatty hilum, measuring 9 x 8 mm. IMPRESSION: 1. Irregular mass within the RIGHT breast at the 2:30 o'clock axis, 3 cm from the nipple, measuring 1.1 x 1 x 1 cm, corresponding to the location of the hypermetabolic nodule described on recent PET-CT report, also corresponding to a palpable lump recently identified by the patient. Recommend ultrasound-guided biopsy. 2. No evidence of malignancy within the LEFT breast. 3. Irregular/morphologically abnormal lymph node within  the RIGHT axilla, measuring 9 x 8 mm, possible correlate for the small hypermetabolic right axillary lymph node  described on recent PET-CT report. RECOMMENDATION: 1. Ultrasound-guided biopsy of the right breast mass at the 2:30 o'clock axis. 2. Ultrasound-guided biopsy of the irregular/morphologically abnormal lymph node in the right axilla. Ultrasound-guided biopsies are scheduled for Friday, 11/16/2016 at 10:30 a.m. I have discussed the findings and recommendations with the patient. Results were also provided in writing at the conclusion of the visit. If applicable, a reminder letter will be sent to the patient regarding the next appointment. BI-RADS CATEGORY  5: Highly suggestive of malignancy. Electronically Signed: By: Franki Cabot M.D. On: 11/14/2016 14:41    ASSESSMENT & PLAN:  Diffuse large B-cell lymphoma, at least stage III Bilateral Pleural Effusions R breast mass Anxiety about health Hx of spontaneous pneumothorax History of Hepatitis C, treated with harvoni  No matching staging information was found for the patient. No problem-specific Assessment & Plan notes found for this encounter.  Pathology, labs, and imaging reviewed. Results are noted above. BMBX performed yesterday, pending.  Additional labs will be drawn after our visit today.  The patient was diagnosed with diffuse large B-cell lymphoma. There is also concern for synchronous right breast primary.   She was previously treated with Harvoni for Hepatitis C by Dr. Britta Mccreedy in [place].  I spoke with the patient about non-Hodgkin's lymphoma and reviewed the NCCN guidelines for treatment. The patient was given a copy of these guidelines. We discussed treatment options including chemotherapy R-CHOP.   She has lost about 12 lbs in the last month. I have encouraged her to try dietary supplements Boost or Ensure. I will refer her to nutrition.   I have recommended the following as well: 1. Thoracentesis of R pleural fluid and send for cytology 2. ECHO prior to chemotherapy 3. Port placement which is already scheduled with Dr. Dalbert Batman 4.  We discussed the "what if" she has breast cancer, I advised her that we will address once pathology is available. It is critical that her DLBCL is treated now, we will definitely address/come up with a plan for both if needed  The patient met with our patient navigator [place] today. She will be scheduled for chemotherapy teaching.   She will return for follow up coordinated by [facility].  ORDERS PLACED FOR THIS ENCOUNTER: Orders Placed This Encounter  Procedures  . Ambulatory referral to Social Work   Orders Placed This Encounter  Procedures  . CBC with Differential  . Comprehensive metabolic panel  . Sedimentation rate  . Lactate dehydrogenase  . Hepatitis B core antibody, IgM  . Hepatitis B surface antigen  . Ambulatory referral to Social Work    Referral Priority:   Routine    Referral Type:   Consultation    Referral Reason:   Specialty Services Required    Number of Visits Requested:   1   All questions were answered. The patient knows to call the clinic with any problems, questions or concerns.  This document serves as a record of services personally performed by Ancil Linsey, MD. It was created on her behalf by Arlyce Harman, a trained medical scribe. The creation of this record is based on the scribe's personal observations and the provider's statements to them. This document has been checked and approved by the attending provider.  I have reviewed the above documentation for accuracy and completeness and I agree with the above.  This note was electronically signed.   Molli Hazard,  MD  11/15/2016 4:45 PM

## 2016-11-15 NOTE — Progress Notes (Signed)
Met with pt face to face.  Introduced myself and explain a little bit about my role as the patient navigator.  Pt given a card with all my information on it.  Told pt to call if they had any questions or concerns.  Pt verbalized understanding.  Pt set up for chemotherapy teaching, we will probably start chemotherapy the next day.

## 2016-11-15 NOTE — Progress Notes (Signed)
Pt had blood drawn from right wrist. Attempts x2 made to draw blood, second attempt successful, blood sent to lab for resulting. Pt tolerated well. Pt stable and discharged ambulatory.

## 2016-11-16 ENCOUNTER — Other Ambulatory Visit (HOSPITAL_COMMUNITY)
Admission: RE | Admit: 2016-11-16 | Discharge: 2016-11-16 | Disposition: A | Discharge status: Home / Self Care | Payer: BLUE CROSS/BLUE SHIELD | Source: Ambulatory Visit | Attending: Diagnostic Radiology | Admitting: Diagnostic Radiology

## 2016-11-16 ENCOUNTER — Ambulatory Visit
Admission: RE | Admit: 2016-11-16 | Discharge: 2016-11-16 | Disposition: A | Discharge status: Home / Self Care | Payer: BLUE CROSS/BLUE SHIELD | Source: Ambulatory Visit | Attending: General Surgery | Admitting: General Surgery

## 2016-11-16 ENCOUNTER — Ambulatory Visit (HOSPITAL_COMMUNITY): Payer: BLUE CROSS/BLUE SHIELD | Admitting: Hematology & Oncology

## 2016-11-16 ENCOUNTER — Other Ambulatory Visit (HOSPITAL_COMMUNITY): Payer: Self-pay | Admitting: Hematology & Oncology

## 2016-11-16 DIAGNOSIS — C8338 Diffuse large B-cell lymphoma, lymph nodes of multiple sites: Secondary | ICD-10-CM

## 2016-11-16 DIAGNOSIS — R599 Enlarged lymph nodes, unspecified: Secondary | ICD-10-CM

## 2016-11-16 DIAGNOSIS — N6310 Unspecified lump in the right breast, unspecified quadrant: Secondary | ICD-10-CM

## 2016-11-16 DIAGNOSIS — J90 Pleural effusion, not elsewhere classified: Secondary | ICD-10-CM

## 2016-11-16 LAB — HEPATITIS B CORE ANTIBODY, IGM: HEP B C IGM: NEGATIVE

## 2016-11-16 LAB — HEPATITIS B SURFACE ANTIGEN: Hepatitis B Surface Ag: NEGATIVE

## 2016-11-16 NOTE — Progress Notes (Signed)
Patient's husband here to pick up Boost drink. Instructions given to drink by 0830 DOS, voiced understanding.

## 2016-11-16 NOTE — Patient Instructions (Addendum)
[place]   CHEMOTHERAPY INSTRUCTIONS  You have been diagnosed with Stage 3 diffuse large b-cell lymphoma.  We are going to treat you with a combination of drugs called RCHOP.  These include cytoxan, adriamycin, vincristine, neulasta and rituxan.  You will receive this chemotherapy every 21 days.  21 days is 1 cycle.  You will have 6 cycles.  This is with curative intent.   You will see the doctor regularly throughout treatment.  We monitor your lab work prior to every treatment.  The doctor monitors your response to treatment by the way you are feeling, your blood work, and scans periodically.  You will receive premedications prior to chemotherapy.  These include: Premeds: Tylenol and Benadryl:  Given to help prevent any type of reaction to chemotherapy.  Aloxi: - high powered nausea/vomiting prevention medication used for chemotherapy patients. Dexamethasone - steroid - given to reduce the risk of you having an allergic type reaction to the chemotherapy. Dex can cause you to feel energized, nervous/anxious/jittery, make you have trouble sleeping, and/or make you feel hot/flushed in the face/neck and/or look pink/red in the face/neck. These side effects will pass as the Dex wears off. (takes 20 minutes to infuse)   POTENTIAL SIDE EFFECTS OF TREATMENT:  Cyclophosphamide (Generic Name) Other Names: Cytoxan, Neosar  About This Drug Cyclophosphamide is a drug used to treat cancer. It is given in the vein (IV) or by mouth. This drug takes 30 minutes to infuse.  Possible Side Effects (More Common)  Nausea and throwing up (vomiting). These symptoms may happen within a few hours after your treatment and may last up to 72 hours. Medicines are available to stop or lessen these side effects.  Bone marrow depression. This is a decrease in the number of white blood cells, red blood cells, and platelets. This may raise your risk of infection, make you tired and weak  (fatigue), and raise your risk of bleeding.  Hair loss: You may notice hair getting thin. Some patients lose their hair. Hair loss is often complete scalp hair loss and can involve loss of eyebrows, eyelashes, and pubic hair. You may notice this a few days or weeks after treatment has started. Most often hair loss is temporary; your hair should grow back when treatment is done.  Decreased appetite (decreased hunger)  Blurred vision  Soreness of the mouth and throat. You may have red areas, white patches, or sores that hurt.  Effects on the bladder. This drug may cause irritation and bleeding in the bladder. You may have blood in your urine. To help stop this, you will get extra fluids to help you pass more urine. You may get a drug called mesna, which helps to decrease irritation and bleeding. You may also get a medicine to help you pass more urine. You may have a catheter (tube) placed in your bladder so that your bladder will be washed with this drug.  Possible Side Effects (Less Common)  Darkening of the skin or nails  Metallic taste in the mouth  Changes in lung tissue may happen with large amounts of this drug. These changes may not last forever, and your lung tissue may go back to normal. Sometimes these changes may not be seen for many years. You may get a cough or have trouble catching your breath.  Allergic Reactions Serious allergic reactions including anaphylaxis are rare. While you are getting this drug in your vein (IV), tell your nurse right away if you  have any of these symptoms of an allergic reaction:  Trouble catching your breath  Feeling like your tongue or throat are swelling  Feeling your heart beat quickly or in a not normal way (palpitations)  Feeling dizzy or lightheaded  Flushing, itching, rash, and/or hives  Treating Side Effects  Drink 6-8 cups of fluids each day unless your doctor has told you to limit your fluid intake due to some other  health problem. A cup is 8 ounces of fluid. If you throw up or have loose bowel movements you should drink more fluids so that you do not become dehydrated (lack water in the body due to losing too much fluid).  Ask your doctor or nurse about medicine that is available to help stop or lessen nausea or throwing up.  Mouth care is very important. Your mouth care should consist of routine, gentle cleaning of your teeth or dentures and rinsing your mouth with a mixture of 1/2 teaspoon of salt in 8 ounces of water or  teaspoon of baking soda in 8 ounces of water. This should be done at least after each meal and at bedtime.  If you have mouth sores, avoid mouthwash that has alcohol. Also avoid alcohol and smoking because they can bother your mouth and throat.  Talk with your nurse about getting a wig before you lose your hair. Also, call the [facility] at 800-ACS-2345 to find out information about the  [employer] program close to where you live. It is a free program where women undergoing chemotherapy learn about wigs, turbans and scarves as well as makeup techniques and skin and nail care. Important Information  Whenever you tell a doctor or nurse your health history, always tell them that you have received cyclophosphamide in the past.  If you take this drug by mouth swallow the medicine whole. Do not chew, break or crush it.  You can take the medicine with or without food. If you have nausea, take it with food. Do not take the pills at bedtime.  Food and Drug Interactions There are no known interactions of cyclophosphamide with food. This drug may interact with other medicines. Tell your doctor and pharmacist about all the medicines and dietary supplements (vitamins, minerals, herbs and others) that you are taking at this time. The safety and use of dietary supplements and alternative diets are often not known. Using these might affect your cancer or  interfere with your treatment. Until more is known, you should not use dietary supplements or alternative diets without your cancer doctor's help. When to Call the Doctor Call your doctor or nurse right away if you have any of these symptoms:  Fever of 100.5 F (38 C) or higher  Chills  Bleeding or bruising that is not normal  Blurred vision or other changes in eyesight  Pain when passing urine; blood in urine  Pain in your lower back or side  Wheezing or trouble breathing  Swelling of legs, ankles, or feet  Feeling dizzy or lightheaded  Feeling confused or agitated  Signs of liver problems: dark urine, pale bowel movements, bad stomach pain, feeling very tired and weak, unusual itching, or yellowing of the eyes or skin  Unusual thirst or passing urine often  Nausea that stops you from eating or drinking  Throwing up more than 3 times a day Call your doctor or nurse as soon as possible if any of these symptoms happen:  Pain in your mouth or throat that makes  it hard to eat or drink  Nausea not relieved by prescribed medicines  Sexual Problems and Reproductive Concerns  Infertility warning: Sexual problems and reproduction concerns may happen. In both men and women, this drug may affect your ability to have children. This cannot be determined before your treatment. Talk with your doctor or nurse if you plan to have children. Ask for information on sperm or egg banking.  In men, this drug may interfere with your ability to make sperm, but it should not change your ability to have sexual relations.  In women, menstrual bleeding may become irregular or stop while you are getting this drug. Do not assume that you cannot become pregnant if you do not have a menstrual period.  Women may go through signs of menopause (change of life) like vaginal dryness or itching. Vaginal lubricants can be used to lessen vaginal dryness, itching, and pain during sexual relations.   Genetic counseling is available for you to talk about the effects of this drug therapy on future pregnancies. Also, a genetic counselor can look at the possible risk of problems in the unborn baby due to this medicine if an exposure happens during pregnancy.  Pregnancy warning: This drug may have harmful effects on the unborn child, so effective methods of birth control should be used during your cancer treatment.  Breast feeding warning: Women should not breast feed during treatment because this drug could enter the breast milk and badly harm a breast feeding baby    Doxorubicin (Generic Name) Other Names: Adriamycin, hydroxyl daunorubicin  About This Drug Doxorubicin is a drug used to treat cancer. This drug is given in the vein (IV).  This is an IV push that will take about 5-10 minutes.  Possible Side Effects (More Common)  Bone marrow depression. This is a decrease in the number of white blood cells, red blood cells, and platelets. This may raise your risk of infection, make you tired and weak (fatigue), and raise your risk of bleeding.  Hair loss: Hair loss is often complete scalp hair loss and can involve loss of eyebrows, eyelashes, and pubic hair. You may notice this a few days or weeks after treatment has started. Most often hair loss is temporary; your hair should grow back when treatment is done.  Nausea and throwing up (vomiting). These symptoms may happen within a few hours after your treatment and may last up to 24 hours. Medicines are available to stop or lessen these side effects.  Soreness of the mouth and throat. You may have red areas, white patches, or sores that hurt.  Change in the color of your urine to pink or red. This color change will go away in one to two days.  Effects on the heart: This drug can weaken the heart and lower heart function. Your heart function will be checked as needed. You may have trouble catching your breath, mainly during  activities. You may also have trouble breathing while lying down, and have swelling in your ankles.  Sensitivity to light (photosensitivity). Photosensitivity means that you may become more sensitive to the effects of the sun, sun lamps, and tanning beds. Your eyes may water more, mostly in bright light.  Metallic taste in the mouth: This may change the taste of food and drinks  Decreased appetite (decreased hunger)  Darkening of the skin or nails  Weakness that interferes with your daily activities  Possible Side Effects (Less Common)  Skin and tissue irritation may involve redness, pain, warmth,  or swelling at the IV site. This happens if the drug leaks out of the vein and into nearby tissue.  Changes in your liver function. Your doctor will check your liver function as needed.  This drug may cause an increased risk of developing a second cancer  Allergic Reaction Serious allergic reactions, including anaphylaxis are rare. While you are getting this drug in your vein (IV), tell your nurse right away if you have any of these symptoms of an allergic reaction:  Trouble catching your breath  Feeling like your tongue or throat are swelling  Feeling your heart beat quickly or in a not normal way (palpitations)  Feeling dizzy or lightheaded  Flushing, itching, rash, and/or hives  Treating Side Effects  Drink 6-8 cups of fluids every day unless your doctor has told you to limit your fluid intake due to some other health problem. A cup is 8 ounces of fluid. If you vomit or have diarrhea, you should drink more fluids so that you do not become dehydrated (lack water in the body due to losing too much fluid).  Ask your doctor or nurse about medicine that is available to help stop or lessen nausea, throwing up, and/or loose bowel movements  Wear dark sunglasses and use sunscreen with SPF 30 or higher when you are outdoors even for a short time. Cover up when you are out in the  sun. Wear wide-brimmed hats, long-sleeved shirts, and pants. Keep your neck, chest, and back covered.  Mouth care is very important. Your mouth care should consist of routine, gentle cleaning of your teeth or dentures and rinsing your mouth with a mixture of 1/2 teaspoon of salt in 8 ounces of water or  teaspoon of baking soda in 8 ounces of water. This should be done at least after each meal and at bedtime.  If you have mouth sores, avoid mouthwash that has alcohol. Avoid alcohol and smoking because they can bother your mouth and throat.  Talk with your nurse about getting a wig before you lose your hair. Also, call the [facility] at 800-ACS-2345 to find out information about the Look Good, Feel Better program close to where you live. It is a free program where women getting chemotherapy can learn about wigs, turbans and scarves as well as makeup techniques and skin and nail care.  While you are getting this drug, please tell your nurse right away if you have any pain, redness, or swelling at the site of the IV infusion.  Food and Drug Interactions There are no known interactions of doxorubicin with food. This drug may interact with other medicines. Tell your doctor and pharmacist about all the medicines and dietary supplements (vitamins, minerals, herbs and others) that you are taking at this time. The safety and use of dietary supplements and alternative diets are often not known. Using these might affect your cancer or interfere with your treatment. Until more is known, you should not use dietary supplements or alternative diets without your cancer doctor's help.  When to Call the Doctor Call your doctor or nurse right away if you have any of these symptoms:  Fever of 100.5 F (38 C) or above  Chills  Easy bruising or bleeding  Wheezing or trouble breathing  Rash or itching  Feeling dizzy or lightheaded  Feeling that your heart is beating in a fast or not  normal way (palpitations)  Loose bowel movements (diarrhea) more than 4 times a day or diarrhea with weakness or  feeling lightheaded  Nausea that stops you from eating or drinking  Throwing up more than 3 times a day  Signs of liver problems: dark urine, pale bowel movements, bad stomach pain, feeling very tired and weak, unusual itching, or yellowing of the eyes or skin,  During the IV infusion, if you have pain, redness, or swelling at the site of the IV infusion, please tell your nurse right away Call your doctor or nurse as soon as possible if any of these symptoms happen:  Decreased urine  Pain in your mouth or throat that makes it hard to eat or drink  Nausea and throwing up that is not relieved by prescribed medicines  Rash that is not relieved by prescribed medicines  Swelling of legs, ankles, or feet  Weight gain of 5 pounds in one week (fluid retention)  Lasting loss of appetite or rapid weight loss of five pounds in a week  Fatigue that interferes with your daily activities  Extreme weakness that interferes with normal activities  Sexual Problems and Reproduction Concerns  Infertility warning: Sexual problems and reproduction concerns may happen. In both men and women, this drug may affect your ability to have children. This cannot be determined before your treatment. Talk with your doctor or nurse if you plan to have children. Ask for information on sperm or egg banking.  In men, this drug may interfere with your ability to make sperm, but it should not change your ability to have sexual relations.  In women, menstrual bleeding may become irregular or stop while you are getting this drug. Do not assume that you cannot become pregnant if you do not have a menstrual period.  Women may go through signs of menopause (change of life) like vaginal dryness or itching. Vaginal lubricants can be used to lessen vaginal dryness, itching, and pain during  sexual relations.  Genetic counseling is available for you to talk about the effects of this drug therapy on future pregnancies. Also, a genetic counselor can look at the possible risk of problems in the unborn baby due to this medicine if an exposure happens during pregnancy.  Pregnancy warning: This drug may have harmful effects on the unborn baby, so effective methods of birth control should be used by you and your partner during your cancer treatment and for at least 6 months after treatment  Breast feeding warning: Women should not breast feed during treatment because this drug could enter the breast milk and badly harm a breast feeding baby.   Pegfilgrastim (Generic Name) Other Names: Neulasta  About This Drug Pegfilgrastim is used after chemotherapy to help your body make more white blood cells.. It is given by a shot under the skin (subcutaneously).  Possible Side Effects (More Common)  Bone Pain  Swelling of your legs, ankles and/or feet  Nausea and throwing up (vomiting)  Possible Side Effects (Less Common)  Constipation (not able to move bowels)  Treating Side Effects  Drink 6-8 cups of fluids each day unless your doctor has told you to limit your fluid intake due to some other health problem. A cup is 8 ounces of fluid. If you throw up or have loose bowel movements, you should drink more fluids so that you do not become dehydrated (lack water in the body from losing too much fluid).  Ask your doctor or nurse about medicine that is available to help prevent or lessen bone, joint, and muscle pain.  Ask your doctor or nurse about medicine that is  available to help prevent or lessen nausea and throwing up.  If you are constipated, check with your doctor or nurse before you use enemas, laxatives, or suppositories.  Important Information  If you are getting pegfilgrastim at home, you will get directions telling you how to give the shot.  Store the  pre-filled syringes in the refrigerator. They should be kept in their carton to protect them from light until you use them.  Do not shake the syringe.  You may remove a syringe from the refrigerator to let it reach room temperature but it should be protected from light. Do not leave a syringe out of the refrigerator for more than 48 hours. If you leave a syringe out for more than 48 hours, throw it away.  Do not freeze the syringes. If a syringe is freezes by accident, it can be thawed in the refrigerator. If the drug is frozen a second time, do not use it, throw it away.  Food and Drug Interactions There are no known interactions of pegfilgrastim with food. This drug may interact with other medicines. Tell your doctor and pharmacist about all the medicines and dietary supplements (vitamins, minerals, herbs and others) that you are taking at this time. The safety and use of dietary supplements and alternative diets are often not known. Using these might affect your cancer or interfere with your treatment. Until more is known, you should not use dietary supplements or alternative diets without your cancer doctor's help.  When to Call the Doctor Call your doctor or nurse right away if you have any of these symptoms:  Rash, fever, chills, dizziness, fast heartbeat (palpitations), and/or feeling short of breath after you get a dose of this medicine  Fever of 100.5 F (38 C) or higher  Chills  Pain in your left upper abdomen or shoulder  Nausea that stops you from eating or drinking  Throwing up more than 3 times in one day Call your doctor or nurse as soon as possible if you have any of these symptoms:  Nausea, throwing up, or diarrhea (loose bowel movements) that is not relieved by prescribed medicines  Bone, joint, or muscle pain that is not relieved by prescribed medicines  Swelling in the legs, feet, or ankles  Loose bowel movements (diarrhea) 5 or 6 times in one day, or  diarrhea with weakness  Reproduction Concerns  Pregnancy warning: It is not known if this drug may harm an unborn child. For this reason, be sure to talk with your doctor if you are pregnant or planning to become pregnant while getting this drug.  Breast feeding warning: It is not known if this drug passes into breast milk. For this reason, women should talk to their doctor about the risks and benefits of breast feeding during treatment with this drug because this drug may enter the breast milk and badly harm a breast feeding baby.   Rituximab (Generic Name) Other Name: Rituxan  About This Drug Rituximab is a monoclonal antibody used to treat cancer. This drug is given in the vein (IV).  Possible Side Effects (More Common)  Bone marrow depression. This is a decrease in the number of white blood cells, red blood cells, and platelets. This may raise your risk of infection, make you tired and weak (fatigue), and raise your risk of bleeding.  Rash-skin irritation, redness or itching (dermatitis)  Flu-like symptoms: fever, headache, muscle and joint aches, and fatigue (low energy, feeling weak)  Infusion-related reactions  Hepatitis B -  if you have ever had hepatitis B, the virus may come back during treatment with this drug. Your doctor will test to see if you have ever had hepatitis B prior to your treatment.  Changes in your central nervous system can happen. The central nervous system is made up of your brain and spinal cord. You could feel: extreme tiredness, agitation, confusion, or have: hallucinations (see or hear things that are not there), trouble understanding or speaking, loss of control of your bowels or bladder, eyesight changes, numbness or lack of strength to your arms, legs, face, or body, seizures or coma. If you start to have any of these symptoms let your doctor know right away.  Tumor lysis: This drug may act on the cancer cells very quickly. This may affect  how your kidneys work. Your doctor will monitor your kidney function.  Changes in your liver function. Your doctor will check your liver function as needed.  Nausea and throwing up (vomiting): these symptoms may happen within a few hours after your treatment and may last up to 24 hours. Medicines are available to stop or lessen these side effects.  Loose bowel movements (diarrhea) that may last for a few days  Abdominal pain  Infections  Cough, runny nose  Swelling of your legs, ankles and/or feet or hands  High blood pressure Your doctor will check your blood pressure as needed.  Abnormal heart beat  Possible Side Effects (Less Common)  Shortness of breath  Soreness of the mouth and throat. You may have red areas, white patches, or sores that hurt.  Infusion Reactions Infusion Reactions are the most common side effect linked to use of this drug and can be quite severe. Medicines will be given before you get the drug to lower the severity of this side effect. The infusion reactions are the worse with the first dose of the drug and become less severe with more doses of the drug. While you are getting this drug in your vein (IV), tell your nurse right away if you have any of these symptoms of an allergic reaction:  Trouble catching your breath  Feeling like your tongue or throat are swelling  Feeling your heart beat quickly or in a not normal way (palpitations)  Feeling dizzy or lightheaded  Flushing, itching, rash, and/or hives  Treating Side Effects  Ask your doctor or nurse about medicine to stop or lessen headache, loose bowel movements (diarrhea), constipation, nausea, throwing up (vomiting), or pain.  If you get a rash do not put anything it unless your doctor or nurse says you may. Keep the area around the rash clean and dry. Ask your doctor for medicine if the rash bothers you.  Drink 6-8 cups of fluids each day unless your doctor has told you to limit your  fluid intake due to some other health problem. A cup is 8 ounces of fluid. If you throw up or have loose bowel movements, you should drink more fluids so that you do not become dehydrated (lack of water in the body from losing too much fluid).  If you are not able to move your bowels, check with your doctor or nurse before you use enemas, laxatives, or suppositories  If you have mouth sores, avoid mouthwash that has alcohol. Also avoid alcohol and smoking because they can bother your mouth and throat.  If you have a nose bleed, sit with your head tipped slightly forward. Apply pressure by lightly pinching the bridge of your nose between  your thumb and forefinger. Call your doctor if you feel dizzy or faint or if the bleeding doesnt stop after 10 to 15 minutes  Important Information  After treatment with this drug, vaccination with live viruses should be delayed until the immune system recovers.  Symptoms of abnormal bleeding may be: coughing up blood, throwing up blood (may look like coffee grounds), red or black, tarry bowel movements, blood in urine, abnormally heavy menstrual flow, nosebleeds, or any unusual bleeding.  Symptoms of high blood pressure may be: headache, blurred vision, confusion, chest pain, or a feeling that your heart is beat differently.  Urinary tract infection. Symptoms may include:  Pain or burning when you pass urine  Feeling like you have to pass urine often, but not much comes out when you do.  Tender or heavy feeling in your lower abdomen  Cloudy urine and/or urine that smells bad.  Pain on one side of your back under your ribs. This is where your kidneys are.  Fever, chills, nausea and/or throwing up  Food and Drug Interactions There are no known interactions of rituximab and any food. This drug may interact with other medicines. Tell your doctor and pharmacist about all the medicines and dietary supplements (vitamins, minerals, herbs and  others) that you are taking at this time. The safety and use of dietary supplements and alternative diets are often not known. Using these might affect your cancer or interfere with your treatment. Until more is known, you should not use dietary supplements or alternative diets without your cancer doctor's help.  When to Call the Doctor Call your doctor or nurse right away if you have any of these symptoms:  Fever of 100.5 F (38 C) higher  Chills  Trouble breathing  Rash with or without itching  Blistering or peeling of skin  Chest pain or symptoms of a heart attack. Most heart attacks involve pain in the center of the chest that lasts more than a few minutes. The pain may go away and come back or it can be constant. It can feel like pressure, squeezing, fullness, or pain. Sometimes pain is felt in one or both arms, the back, neck, jaw, or stomach. If any of these symptoms last 2 minutes, call 911  Easy bleeding or bruising  Blood in urine or bowel movements  Feeling that your heart is beating in a fast or not normal way (palpitations)  Nausea that stops you from eating or drinking  Throwing up (vomiting) more than 3 times in one day  Abdominal pain  Loose bowel movements (diarrhea) 4 times in one day or diarrhea with weakness or lightheadedness  No bowel movement in 3 days or if you feel uncomfortable  Feeling dizzy or lightheaded  Changes in your speech or vision  Feeling confused  Weakness of your arms and legs or poor coordination (feeling clumsy)  Signs of liver problems: dark urine, pale bowel movements, bad stomach pain, feeling very tired and weak, unusual itching, or yellowing of skin or eyes  Symptoms of a urinary tract infection (see important information) Call your doctor or nurse as soon as possible if you have any of these symptoms:  Swelling of your legs, ankles and/or feet  Fatigue and /or weakness that interferes with your daily activities   Joint and muscle pain or muscle spasms that are not relieved by prescribed medicines  Cough that lasts longer than normal  Reproduction Concerns  Pregnancy warning: This drug is known to cross the placenta. This  drug may have harmful effects on an unborn baby. Effective methods of birth control should be used during treatment with this drug and for 12 months after the last treatment. If exposure occurs to an unborn baby, the babys immune system may be affected, which could last for months after birth. Until the immune system recovers, live vaccines should not be administered to the baby. Be sure to talk with your doctor if you are pregnant or planning to become pregnant while getting this drug.  Breast feeding warning: It is not known if rituximab is passed into human breast milk. In animal studies, this drug was detected in in breast milk. For this reason, women should talk to their doctor about the risks and benefits of breast feeding during treatment with this drug because this drug may enter the breast milk and badly harm a breast feeding baby.    Vincristine sulfate (Generic Name) Other Names: Oncovin, Vincasar, VCR  About This Drug Vincristine is a drug used to treat cancer. It is given in the vein (IV).  This drug will take 10 minutes to infuse.  Possible Side Effects (More Common)  Constipation (not able to move bowels)  Hair loss. You may notice hair getting thin. Some patients lose their hair. Your hair often grows back when treatment is done. Most often hair loss is temporary; your hair should grow back when treatment is done.  Effects on the nerves are called peripheral neuropathy. You may feel numbness, tingling, or pain in your hands and feet. It may be hard for you to button your clothes, open jars, or walk as usual. The effect on the nerves may get worse with more doses of the drug. These effects get better in some people after the drug is stopped but it does  not get better in all people.  Possible Side Effects (Less Common)  Soreness of the mouth and throat. You may have red areas, white patches, or sores that hurt.  Swelling of your legs, ankles, and/or feet  Skin and tissue irritation may involve redness, pain, warmth, or swelling at the IV site. This happens if the drug leaks out of the vein and into nearby tissue.  Blood pressure changes. Some patients have low blood pressure and some patients have high blood pressure.  Jaw pain  Hoarseness  Blurred or double vision or other changes in eyesight may happen but are a rare side effect.  Feeling dizzy or lightheaded  Drooping eyelids are a rare side effect.  Depression  Rash  Bone marrow depression. This is a decrease in the number of white blood cells, red blood cells, and platelets. This may raise your risk of infection, make you tired and weak (fatigue), and raise your risk of bleeding.  Chest pain or symptoms of a heart attack. Most heart attacks involve pain in the center of the chest that lasts more than a few minutes. The pain may go away and come back or it can be constant. It can feel like pressure, squeezing, fullness, or pain. Sometimes pain is felt in one or both arms, the back, neck, jaw, or stomach. If any of these symptoms last 2 minutes, call 911.  Trouble sleeping  Lack of appetite; weight loss  Allergic Reactions Serious allergic reactions including anaphylaxis are rare. While you are getting this drug in your vein (IV), tell your nurse right away if you have any of these symptoms of an allergic reaction:  Trouble catching your breath  Feeling like your tongue  or throat are swelling  Feeling your heart beat quickly or in a not normal way (palpitations)  Feeling dizzy or lightheaded  Flushing, itching, rash, and/or hives  Treating Side Effects  While you are getting this drug in your IV, please tell your nurse right away if you have any  pain, redness, or swelling at the site of the IV infusion.  Ask your doctor or nurse how to prevent or lessen constipation. Constipation can become a serious side effect. If you are constipated, check with your doctor or nurse before you use enemas, laxatives, or suppositories.  Drink 6-8 cups of fluids each day unless your doctor has told you to limit your fluid intake due to some other health problem. A cup is 8 ounces of fluid. If you throw up or have loose bowel movements, you should drink more fluids so that you do not become dehydrated (lack water in the body from losing too much fluid).  Mouth care is very important. Your mouth care should consist of gently cleaning your teeth or dentures and rinsing your mouth with a mixture of  teaspoon salt in 8 ounces of water or  teaspoon sodium bicarbonate (baking soda) in 8 ounces of water. This should be done at least after each meal and at bedtime.  If you have mouth sores, avoid mouthwash that has alcohol. Also avoid alcohol and smoking because they can bother your mouth and throat.  If you get a rash do not put anything on it unless your doctor or nurse says you may. Keep the area around the rash clean and dry. Ask your doctor for medicine if your rash bothers you.  If you have numbness and tingling in your hands and feet, be careful when cooking, walking, and handling sharp objects and hot liquids.  Talk with your nurse about getting a wig before you lose your hair. Also, call the [facility] at 800-ACS-2345 to find out information about the Look Good, Feel Better program close to where you live. It is a free program where women getting chemotherapy can learn about wigs, turbans and scarves as well as makeup techniques and skin and nail care.  Food and Drug Interactions  There are known interactions of Vincristine with grapefruit. Do not eat grapefruit or drink grapefruit juice while taking this drug.  Check with  your doctor or pharmacist about all other prescription medicines and dietary supplements you are taking before starting this medicine as there are a lot of known drug interactions with Vincristine. Also, check with your doctor or pharmacist before starting any new prescription, over-the-counter medicines, or dietary supplement to make sure that there are no interactions.  When to Call the Doctor Call your doctor or nurse right away if you have any of these symptoms:  Redness, pain, warmth, or swelling at the IV site while you are getting this drug  No bowel movement in 3 days or when you feel uncomfortable.  Abdominal pain  Fever of 100.5 F (38 C) or higher  Chills  Feeling short of breath  Easy bleeding or bruising  Jaw pain  Hoarseness  Drooping eyelids  Blurred vision or other changes in eyesight  Feeling confused, restless, or irritable  Seizures (Common symptoms of a seizure can include confusion, blacking out, passing out, loss of hearing or vision, blurred vision, unusual smells or tastes (such as burning rubber), trouble talking, tremors or shaking in parts or all of the body, repeated body movements, tense muscles that do not  relax, and loss of control of urine and bowels. There are other less common symptoms of seizures. If you or your family member suspects you are having a seizure, call 911 right away).  Hallucinations (seeing, hearing, or feeling things that are not really there)  Feeling  Rash  Chest pain or symptoms of a heart attack. Most heart attacks involve pain in the center of the chest that lasts more than a few minutes. The pain may go away and come back. It can feel like pressure, squeezing, fullness, or pain. Sometimes pain is felt in one or both arms, the back, neck, jaw, or stomach. If any of these symptoms last 2 minutes, call 911. Call your doctor or nurse as soon as possible if you have any of these symptoms:  Pain in fingers, toes,  joints, or testicles  Numbness, tingling, or decreased feeling or weakness in fingers, toes, arms, or legs  Trouble walking or changes in the way you walk  Swelling of your legs, ankles, and/or feet  Headache  Pain in your mouth or throat that makes it hard to eat or drink  Loss of appetite or weight loss  Hearing changes  Depression  Trouble sleeping  Reproduction Concerns  Women may go through signs of menopause (change of life) like vaginal dryness or itching. Vaginal lubricants can be used to lessen vaginal dryness, itching, and pain during sexual relations.  Pregnancy warning: This drug may have harmful effects on the unborn child, so effective methods of birth control should be used during your cancer treatment.  Breast feeding warning: It is not known if this drug passes into breast milk. For this reason, women should talk to their doctor about the risks and benefits of breast feeding during treatment with this drug because this drug may enter the breast milk and badly harm a breast feeding baby.    EDUCATIONAL MATERIALS GIVEN AND REVIEWED: Chemotherapy and you book given, nutrition book,  Information on [facility].  SELF CARE ACTIVITIES WHILE ON CHEMOTHERAPY: Hydration  Increase your fluid intake 48 hours prior to treatment and drink at least 8 to 12 cups (64 ounces) of water/decaff beverages per day after treatment. You can still have your cup of coffee or soda but these beverages do not count as part of your 8 to 12 cups that you need to drink daily. No alcohol intake.  Medications Continue taking your normal prescription medication as prescribed.  If you start any new herbal or new supplements please let us know first to make sure it is safe.  Mouth Care Have teeth cleaned professionally before starting treatment. Keep dentures and partial plates clean. Use soft toothbrush and do not use mouthwashes that contain alcohol. Biotene is a good mouthwash that is available  at most pharmacies or may be ordered by calling [phone]. Use warm salt water gargles (1 teaspoon salt per 1 quart warm water) before and after meals and at bedtime. Or you may rinse with 2 tablespoons of three-percent hydrogen peroxide mixed in eight ounces of water. If you are still having problems with your mouth or sores in your mouth please call the clinic. If you need dental work, please let Dr. Whitney Muse know before you go for your appointment so that we can coordinate the best possible time for you in regards to your chemo regimen. You need to also let your dentist know that you are actively taking chemo. We may need to do labs prior to your dental appointment.   Skin  Care Always use sunscreen that has not expired and with SPF (Sun Protection Factor) of 50 or higher. Wear hats to protect your head from the sun. Remember to use sunscreen on your hands, ears, face, & feet.  Use good moisturizing lotions such as udder cream, eucerin, or even Vaseline. Some chemotherapies can cause dry skin, color changes in your skin and nails.     Avoid long, hot showers or baths.  Use gentle, fragrance-free soaps and laundry detergent.  Use moisturizers, preferably creams or ointments rather than lotions because the thicker consistency is better at preventing skin dehydration. Apply the cream or ointment within 15 minutes of showering. Reapply moisturizer at night, and moisturize your hands every time after you wash them.  Hair Loss (if your doctor says your hair will fall out)   If your doctor says that your hair is likely to fall out, decide before you begin chemo whether you want to wear a wig. You may want to shop before treatment to match your hair color.  Hats, turbans, and scarves can also camouflage hair loss, although some people prefer to leave their heads uncovered. If you go bare-headed outdoors, be sure to use sunscreen on your scalp.  Cut your hair short. It eases the inconvenience of  shedding lots of hair, but it also can reduce the emotional impact of watching your hair fall out.  Dont perm or color your hair during chemotherapy. Those chemical treatments are already damaging to hair and can enhance hair loss. Once your chemo treatments are done and your hair has grown back, its OK to resume dyeing or perming hair. With chemotherapy, hair loss is almost always temporary. But when it grows back, it may be a different color or texture. In older adults who still had hair color before chemotherapy, the new growth may be completely gray.  Often, new hair is very fine and soft.  Infection Prevention Please wash your hands for at least 30 seconds using warm soapy water. Handwashing is the #1 way to prevent the spread of germs. Stay away from sick people or people who are getting over a cold. If you develop respiratory systems such as green/yellow mucus production or productive cough or persistent cough let us know and we will see if you need an antibiotic. It is a good idea to keep a pair of gloves on when going into grocery stores/[employer] to decrease your risk of coming into contact with germs on the carts, etc. Carry alcohol hand gel with you at all times and use it frequently if out in public. If your temperature reaches 100.5 or higher please call the clinic and let us know.  If it is after hours or on the weekend please go to the ER if your temperature is over 100.5.  Please have your own personal thermometer at home to use.    Sex and bodily fluids If you are going to have sex, a condom must be used to protect the person that isnt taking chemotherapy. Chemo can decrease your libido (sex drive). For a few days after chemotherapy, chemotherapy can be excreted through your bodily fluids.  When using the toilet please close the lid and flush the toilet twice.  Do this for a few day after you have had chemotherapy.     Effects of chemotherapy on your sex life Some changes are simple  and wont last long. They won't affect your sex life permanently. Sometimes you may feel:  too tired  not strong  enough to be very active  sick or sore   not in the mood  anxious or low Your anxiety might not seem related to sex. For example, you may be worried about the cancer and how your treatment is going. Or you may be worried about money, or about how you family are coping with your illness. These things can cause stress, which can affect your interest in sex. Its important to talk to your partner about how you feel. Remember - the changes to your sex life don't usually last long. There's usually no medical reason to stop having sex during chemo. The drugs won't have any long term physical effects on your performance or enjoyment of sex. Cancer can't be passed on to your partner during sex  Contraception Its important to use reliable contraception during treatment. Avoid getting pregnant while you or your partner are having chemotherapy. This is because the drugs may harm the baby. Sometimes chemotherapy drugs can leave a man or woman infertile.  This means you would not be able to have children in the future. You might want to talk to someone about permanent infertility. It can be very difficult to learn that you may no longer be able to have children. Some people find counselling helpful. There might be ways to preserve your fertility, although this is easier for men than for women. You may want to speak to a fertility expert. You can talk about sperm banking or harvesting your eggs. You can also ask about other fertility options, such as donor eggs. If you have or have had breast cancer, your doctor might advise you not to take the contraceptive pill. This is because the hormones in it might affect the cancer.  It is not known for sure whether or not chemotherapy drugs can be passed on through semen or secretions from the vagina. Because of this some doctors advise people to use a  barrier method if you have sex during treatment. This applies to vaginal, anal or oral sex. Generally, doctors advise a barrier method only for the time you are actually having the treatment and for about a week after your treatment. Advice like this can be worrying, but this does not mean that you have to avoid being intimate with your partner. You can still have close contact with your partner and continue to enjoy sex.  Animals If you have cats or birds we just ask that you not change the litter or change the cage.  Please have someone else do this for you while you are on chemotherapy.   Food Safety During and After Cancer Treatment Food safety is important for people both during and after cancer treatment. Cancer and cancer treatments, such as chemotherapy, radiation therapy, and stem cell/bone marrow transplantation, often weaken the immune system. This makes it harder for your body to protect itself from foodborne illness, also called food poisoning. Foodborne illness is caused by eating food that contains harmful bacteria, parasites, or viruses.  Foods to avoid Some foods have a higher risk of becoming tainted with bacteria. These include:  Unwashed fresh fruit and vegetables, especially leafy vegetables that can hide dirt and other contaminants  Raw sprouts, such as alfalfa sprouts  Raw or undercooked beef, especially ground beef, or other raw or undercooked meat and poultry  Fatty, fried, or spicy foods immediately before or after treatment.  These can sit heavy on your stomach and make you feel nauseous.  Raw or undercooked shellfish, such as oysters.  Sushi  and sashimi, which often contain raw fish.   Unpasteurized beverages, such as unpasteurized fruit juices, raw milk, raw yogurt, or cider  Undercooked eggs, such as soft boiled, over easy, and poached; raw, unpasteurized eggs; or foods made with raw egg, such as homemade raw cookie dough and homemade mayonnaise Simple steps  for food safety [employer].  Do not buy food stored or displayed in an unclean area.  Do not buy bruised or damaged fruits or vegetables.  Do not buy cans that have cracks, dents, or bulges.  Pick up foods that can spoil at the end of your shopping trip and store them in a cooler on the way home. Prepare and clean up foods carefully.  Rinse all fresh fruits and vegetables under running water, and dry them with a clean towel or paper towel.  Clean the top of cans before opening them.  After preparing food, wash your hands for 20 seconds with hot water and soap. Pay special attention to areas between fingers and under nails.  Clean your utensils and dishes with hot water and soap.  Disinfect your kitchen and cutting boards using 1 teaspoon of liquid, unscented bleach mixed into 1 quart of water.   Dispose of old food.  Eat canned and packaged food before its expiration date (the use by or best before date).  Consume refrigerated leftovers within 3 to 4 days. After that time, throw out the food. Even if the food does not smell or look spoiled, it still may be unsafe. Some bacteria, such as Listeria, can grow even on foods stored in the refrigerator if they are kept for too long. Take precautions when eating out.  At restaurants, avoid buffets and salad bars where food sits out for a long time and comes in contact with many people. Food can become contaminated when someone with a virus, often a norovirus, or another bug handles it.  Put any leftover food in a to-go container yourself, rather than having the server do it. And, refrigerate leftovers as soon as you get home.  Choose restaurants that are clean and that are willing to prepare your food as you order it cooked.    MEDICATIONS: Prednisone 20 mg:  Take 3 tablets (60 mg) on days 1-5 of chemotherapy.    Allopurinol: Take 1 tablet (300 mg total) by mouth daily. (this will help protect kidneys)  Zofran/Ondansetron 8mg   tablet. Take 1 tablet every 8 hours as needed for nausea/vomiting. (#1 nausea med to take, this can constipate)  Compazine/Prochlorperazine 10mg  tablet. Take 1 tablet every 6 hours as needed for nausea/vomiting. (#2 nausea med to take, this can make you sleepy)   EMLA cream. Apply a quarter size amount to port site 1 hour prior to chemo. Do not rub in. Cover with plastic wrap.   Over-the-Counter Meds:  Miralax 1 capful in 8 oz of fluid daily. May increase to two times a day if needed. This is a stool softener. If this doesn't work proceed you can add:  Senokot S-start with 1 tablet two times a day and increase to 4 tablets two times a day if needed. (total of 8 tablets in a 24 hour period). This is a stimulant laxative.   Call us if this does not help your bowels move.   Imodium 2mg  capsule. Take 2 capsules after the 1st loose stool and then 1 capsule every 2 hours until you go a total of 12 hours without having a loose stool. Call the [facility]  if loose stools continue. If diarrhea occurs @ bedtime, take 2 capsules @ bedtime. Then take 2 capsules every 4 hours until morning. Call [facility].    Constipation Sheet *Miralax in 8 oz of fluid daily.  May increase to two times a day if needed.  This is a stool softener.  If this not enough to keep your bowel regular:  You can add:  *Senokot S, start with one tablet twice a day and can increase to 4 tablets twice a day if needed.  This is a stimulant laxative.   Sometimes when you take pain medication you need BOTH a medicine to keep your stool soft and a medicine to help your bowel push it out!  Please call if the above does not work for you.   Do not go more than 2 days without a bowel movement.  It is very important that you do not become constipated.  It will make you feel sick to your stomach (nausea) and can cause abdominal pain and vomiting.     Diarrhea Sheet  If you are having loose stools/diarrhea, please purchase  Imodium and begin taking as outlined:  At the first sign of poorly formed or loose stools you should begin taking Imodium(loperamide) 2 mg capsules.  Take two caplets (4mg ) followed by one caplet (2mg ) every 2 hours until you have had no diarrhea for 12 hours.  During the night take two caplets (4mg ) at bedtime and continue every 4 hours during the night until the morning.  Stop taking Imodium only after there is no sign of diarrhea for 12 hours.    Always call the [facility] if you are having loose stools/diarrhea that you can't get under control.  Loose stools/disrrhea leads to dehydration (loss of water) in your body.  We have other options of trying to get the loose stools/diarrhea to stopped but you must let us know!    Nausea Sheet  Zofran/Ondansetron 8mg  tablet. Take 1 tablet every 8 hours as needed for nausea/vomiting. (#1 nausea med to take, this can constipate)  Compazine/Prochlorperazine 10mg  tablet. Take 1 tablet every 6 hours as needed for nausea/vomiting. (#2 nausea med to take, this can make you sleepy)  You can take these medications together or separately.  We would first like for you to try the Ondansetron by itself and then take the Prochloperizine if needed. But you are allowed to take both medications at the same time if your nausea is that severe.  If you are having persistent nausea (nausea that does not stop) please take these medications on a staggered schedule so that the nausea medication stays in your body.  Please call the [facility] and let us know the amount of nausea that you are experiencing.  If you begin to vomit, you need to call the [facility] and if it is the weekend and you have vomited more than one time and cant get it to stop-go to the Emergency Room.  Persistent nausea/vomiting can lead to dehydration (loss of fluid in your body) and will make you feel terrible.   Ice chips, sips of clear liquids, foods that are @ room temperature, crackers, and toast  tend to be better tolerated.    SYMPTOMS TO REPORT AS SOON AS POSSIBLE AFTER TREATMENT:  FEVER GREATER THAN 100.5 F  CHILLS WITH OR WITHOUT FEVER  NAUSEA AND VOMITING THAT IS NOT CONTROLLED WITH YOUR NAUSEA MEDICATION  UNUSUAL SHORTNESS OF BREATH  UNUSUAL BRUISING OR BLEEDING  TENDERNESS IN  MOUTH AND THROAT WITH OR WITHOUT PRESENCE OF ULCERS  URINARY PROBLEMS  BOWEL PROBLEMS  UNUSUAL RASH    Wear comfortable clothing and clothing appropriate for easy access to any Portacath or PICC line. Let us know if there is anything that we can do to make your therapy better!     What to do if you need assistance after hours or on the weekends: CALL [phone].  HOLD on the line, do not hang up.  You will hear multiple messages but at the end you will be connected with a nurse triage line.  They will contact Dr Whitney Muse if necessary.  Most of the time they will be able to assist you.   Do not call the hospital operator.  Dr Whitney Muse will not answer phone calls received by them.      I have been informed and understand all of the instructions given to me and have received a copy. I have been instructed to call the clinic [phone] or my family physician as soon as possible for continued medical care, if indicated. I do not have any more questions at this time but understand that I may call the [facility] or the Patient Navigator at [phone] during office hours should I have questions or need assistance in obtaining follow-up care.

## 2016-11-16 NOTE — Pre-Procedure Instructions (Signed)
Pt husband here to pick up boost. Instructed to drink by 0830, voiced understanding, no questions.

## 2016-11-19 NOTE — H&P (Signed)
Monica Gray Location: [facility] Patient #: [id number] DOB: 1950/12/24 Married / Language: English / Race: White Female        History of Present Illness  The patient is a 66 year old female who presents with a complaint of lymphoma and right breast mass.. This is a 66 year old Caucasian female who was here with her husband. She is here for a postop visit following right cervical lymph node biopsy and to discuss her right breast and right axillary mass. She is much more calm today. She is in good spirits. Her anxiety is under better control. She had a good encounter with Dr. Whitney Muse and is happy did not know more about her diagnosis and what the future is going to look like.      Initially referred by Dr. Jacelyn Pi for consideration of right neck lymph node biopsy to rule out lymphoma. Her PCP is Dr.A. Manuella Ghazi on [address]. . Her oncologist is now Dr. Ancil Linsey in [place].       Her right neck cervical lymph node biopsy in the posterior triangle reveals large B-cell lymphoma. She has not had any problems with wound healing. She has no shoulder pain. No evidence of sensory or motor deficit.       Her CT scans of the neck abdomen and chest showed diffuse adenopathy consistent with lymphoma. PET scan on November 09, 2016 shows active lymphoma within the neck, chest, abdomen, and pelvis. She has a right breast hypermetabolic nodule medially and a small hyperbolic right axillary lymph node. She had right larger than left pleural effusions. There is a 12 mm hypermetabolic liver lesion in the lateral segment of the left lobe consistent with lymphoma.       Iliac bone marrow aspiration and biopsy was performed on November 14, 2016, results pending.       I referred her for breast imaging studies and they found a 1.1 cm mass in the right breast at the 2:30 position 3 cm from the nipple an abnormal right axillary lymph node. These were both biopsied today and results  are pending. Dr. Whitney Muse is scheduling her for a thoracentesis. Dr. Whitney Muse canceled her pulmonary medicine consultation with Dr. Melvyn Novas, considering the information that we now have.       She is scheduled for Port-A-Cath insertion by me on January 3. I told her and her husband that we should proceed with that surgery. Hopefully we'll have the histology of her breast and right axillary biopsies by then and we can develop a treatment plan depending on the nature of the breast and axillary pathology.         It looks like they're planning to initiate chemotherapy for the lymphoma the week of January 10.         I discussed the indications, details, techniques, and numerous risk of Port-A-Cath insertion with her and her husband. She is aware the risks of bleeding, infection, bilateral attempts, failure to get the port in, pneumothorax, air embolus, cardiac arrhythmia, and other unforeseen palms. She understands all these issues. Over questions were answered. She agrees with this plan.   Allergies  Acetaminophen *ANALGESICS - NonNarcotic*  Codeine Sulfate *ANALGESICS - OPIOID*  Dyes  red dye Zanaflex *MUSCULOSKELETAL THERAPY AGENTS*  SulfADIAZINE *SULFONAMIDES*  Shellfish   Medication History  Xanax (0.'25MG'$  Tablet, 1 (one) Tablet Oral every twelve hours, as needed for anxiety, Taken starting 11/14/2016) Active. TraMADol HCl ('50MG'$  Tablet, 1 (one) Tablet Oral every six hours, as needed, Taken  starting 11/14/2016) Active. Ibuprofen ('800MG'$  Tablet, Oral) Active. Magnesium ('500MG'$  Tablet, Oral) Active. Multi Vitamin Daily (Oral) Active. Vitamin C ('500MG'$  Tablet, Oral) Active. Probiotic (Oral) Active. Basil Oil Active. (holy basil) [employer] Valerian Night Time ('530MG'$  Capsule, Oral) Active. (valerian chamomile- for sleep) Medications Reconciled  Vitals Weight: 124 lb Height: 64in Body Surface Area: 1.6 m Body Mass Index: 21.28 kg/m  Temp.: 98.57F  Pulse: 90 (Regular)   BP: 142/80 (Sitting, Left Arm, Standard)   Physical Exam  General Mental Status-Alert. General Appearance-Not in acute distress. Build & Nutrition-Well nourished. Posture-Normal posture. Gait-Normal. Note: BMI 21. Husband is present with her throughout the encounter.   Head and Neck Note: Transverse skin crease incision right neck, posterior triangle healing well. No infection or hematoma. No sensory deficit. Trapezius motor function is 5 out of 5 strength bilaterally. Enlarged lymph nodes still palpable middle third of the sternocleidomastoid muscle. Small mode posterior triangle of the left neck. Thyroid does not feel enlarged. No supraclavicular adenopathy.   Chest and Lung Exam Chest and lung exam reveals -on auscultation, normal breath sounds, no adventitious sounds and normal vocal resonance.  Cardiovascular Cardiovascular examination reveals -normal heart sounds, regular rate and rhythm with no murmurs and femoral artery auscultation bilaterally reveals normal pulses, no bruits, no thrills.  Abdomen Inspection Inspection of the abdomen reveals - No Hernias. Palpation/Percussion Palpation and Percussion of the abdomen reveal - Soft, Non Tender, No Rigidity (guarding), No hepatosplenomegaly and No Palpable abdominal masses.  Neurologic Neurologic evaluation reveals -alert and oriented x 3 with no impairment of recent or remote memory, normal attention span and ability to concentrate, normal sensation and normal coordination.  Musculoskeletal Normal Exam - Bilateral-Upper Extremity Strength Normal and Lower Extremity Strength Normal.  Lymphatic Note: No obvious axillary adenopathy. Recent biopsy site right axilla without hematoma. There are bilateral large, relatively fixed inguinal nodes actually below the inguinal crease around the femoral triangle. No sign of infection.     Assessment & Plan   LARGE B-CELL LYMPHOMA (C85.10)  You are  recovering from your right neck lymph node biopsy without any obvious complications Wound care has been discussed  You have been diagnosed with a large B-cell lymphoma Dr. Whitney Muse plans to treat this with chemotherapy You're scheduled for Port-A-Cath insertion by Dr. Dalbert Batman on January 3 We have discussed the indications, techniques, and numerous risk of the surgery  We have reviewed the findings of your scans. The bone marrow biopsy performed on December 27 is still pending.  The biopsy of your right breast and right axillary lymph node was done today. That report should be completed next week and I will discuss that with you. It is possible that you have breast cancer It is also possible that something else is going on in the breast and in the right axilla that might be related to your lymphoma. The treatments would be entirely different so we will wait until we get the final reports.  Dr. Whitney Muse will schedule you for right thoracentesis to evaluate the pleural effusion and right lung masses. It sounds like her chemotherapy will start during the week of January 10. you should call Dr. Whitney Muse to be sure about this.  BREAST MASS, RIGHT (N63.10) Impression: Right breast mass at 2:30 position and right axillary lymph node underwent image guided biopsy today, November 16, 2016 [facility]. Pathology pending.  LIVER MASS, LEFT LOBE (R16.0) Impression: Seen on PET/CT. Lymphoma favored. 12 mm.  LYMPHADENOPATHY, CERVICAL (R59.0) PLEURAL MASS (J94.9) PLEURAL EFFUSION (J90)  ASTHMA IN REMISSION (J45.998) FORMER SMOKER (Z87.891) PNEUMOTHORAX, RIGHT (J93.9) HISTORY OF HEPATITIS C (Z86.19) Impression: Treated with Philomena Doheny M. Dalbert Batman, M.D., [facility] General and Minimally invasive Surgery Breast and Colorectal Surgery Office:   [phone] Pager:   [phone]

## 2016-11-20 ENCOUNTER — Other Ambulatory Visit (HOSPITAL_COMMUNITY): Payer: Self-pay | Admitting: Oncology

## 2016-11-20 ENCOUNTER — Ambulatory Visit (HOSPITAL_COMMUNITY)
Admission: RE | Admit: 2016-11-20 | Discharge: 2016-11-20 | Disposition: A | Discharge status: Home / Self Care | Payer: BLUE CROSS/BLUE SHIELD | Source: Ambulatory Visit | Attending: Oncology | Admitting: Oncology

## 2016-11-20 ENCOUNTER — Ambulatory Visit (HOSPITAL_COMMUNITY)
Admission: RE | Admit: 2016-11-20 | Discharge: 2016-11-20 | Disposition: A | Discharge status: Home / Self Care | Payer: BLUE CROSS/BLUE SHIELD | Source: Ambulatory Visit | Attending: Hematology & Oncology | Admitting: Hematology & Oncology

## 2016-11-20 ENCOUNTER — Encounter (HOSPITAL_COMMUNITY): Payer: Self-pay

## 2016-11-20 ENCOUNTER — Other Ambulatory Visit (HOSPITAL_COMMUNITY): Payer: BLUE CROSS/BLUE SHIELD

## 2016-11-20 ENCOUNTER — Ambulatory Visit (HOSPITAL_COMMUNITY)
Admission: RE | Admit: 2016-11-20 | Discharge: 2016-11-20 | Disposition: A | Discharge status: Home / Self Care | Payer: BLUE CROSS/BLUE SHIELD | Source: Ambulatory Visit | Attending: Diagnostic Radiology | Admitting: Diagnostic Radiology

## 2016-11-20 DIAGNOSIS — Z0189 Encounter for other specified special examinations: Secondary | ICD-10-CM | POA: Diagnosis present

## 2016-11-20 DIAGNOSIS — C8338 Diffuse large B-cell lymphoma, lymph nodes of multiple sites: Secondary | ICD-10-CM

## 2016-11-20 DIAGNOSIS — J90 Pleural effusion, not elsewhere classified: Secondary | ICD-10-CM | POA: Insufficient documentation

## 2016-11-20 DIAGNOSIS — Z79899 Other long term (current) drug therapy: Secondary | ICD-10-CM | POA: Insufficient documentation

## 2016-11-20 DIAGNOSIS — Z9889 Other specified postprocedural states: Secondary | ICD-10-CM

## 2016-11-20 DIAGNOSIS — I351 Nonrheumatic aortic (valve) insufficiency: Secondary | ICD-10-CM | POA: Insufficient documentation

## 2016-11-20 DIAGNOSIS — I071 Rheumatic tricuspid insufficiency: Secondary | ICD-10-CM | POA: Diagnosis not present

## 2016-11-20 LAB — ECHOCARDIOGRAM COMPLETE
E/e' ratio: 5.89
EWDT: 197 ms
FS: 30 % (ref 28–44)
IV/PV OW: 0.82
LA ID, A-P, ES: 23 mm
LA diam end sys: 23 mm
LA vol A4C: 21 ml
LADIAMINDEX: 1.43 cm/m2
LAVOL: 27.5 mL
LAVOLIN: 17.2 mL/m2
LDCA: 3.14 cm2
LV E/e' medial: 5.89
LV E/e'average: 5.89
LV PW d: 8.28 mm — AB (ref 0.6–1.1)
LV TDI E'LATERAL: 11.4
LV dias vol index: 37 mL/m2
LV e' LATERAL: 11.4 cm/s
LV sys vol index: 12 mL/m2
LVDIAVOL: 60 mL (ref 46–106)
LVOT VTI: 26.5 cm
LVOT peak grad rest: 8 mmHg
LVOT peak vel: 145 cm/s
LVOTD: 20 mm
LVOTSV: 83 mL
LVSYSVOL: 19 mL (ref 14–42)
MV Dec: 197
MV pk E vel: 67.1 m/s
MVPKAVEL: 91.3 m/s
RV LATERAL S' VELOCITY: 18.6 cm/s
Simpson's disk: 68
Stroke v: 41 ml
TAPSE: 21.5 mm
TDI e' medial: 7.72

## 2016-11-20 MED ORDER — TRAMADOL HCL 50 MG PO TABS: 50.0000 mg | ORAL_TABLET | Freq: Two times a day (BID) | ORAL | 0 refills | Status: DC | PRN

## 2016-11-20 NOTE — Progress Notes (Signed)
*  PRELIMINARY RESULTS* Echocardiogram 2D Echocardiogram has been performed.  Monica Gray 11/20/2016, 1:54 PM

## 2016-11-20 NOTE — Discharge Instructions (Signed)
Thoracentesis °A thoracentesis is a procedure to remove fluid that has built up in the space between the linings of the chest wall and the lungs (pleural space). It is normal to have a small amount of fluid in the pleural space. Some medical conditions, such as heart failure, pneumonia, kidney problems, or cancer, can create too much fluid. This extra fluid is removed using a needle that is inserted through the skin and tissue and into the pleural space. °A thoracentesis may be done to: °· Understand why there is extra fluid in the pleural space and create a treatment plan that is right for you. °· Help to get rid of shortness of breath, discomfort, or pain that is caused by the extra fluid. °Tell a health care provider about: °· Any allergies you have. °· All medicines you are taking, including vitamins, herbs, eye drops, creams, and over-the-counter medicines. This includes any use of steroids, either by mouth or in a cream. °· Any problems you or family members have had with anesthetic medicines. °· Any blood disorders you have, including any history of blood clots. °· Any surgeries you have had. °· Medical conditions you have, including: °¨ The possibility of pregnancy, if this applies. °¨ Have a frequent cough or coughing episodes. °What are the risks? °Generally, this is a safe procedure. However, problems may occur, including: °· Infection. °· Injury to the lung. °· Lung collapse. °· Bleeding. °What happens before the procedure? °· You may have a chest X-ray or another imaging test, such as a CT scan or ultrasound, to determine the location and amount of fluid in your pleural space. °· Ask your health care provider about: °¨ Changing or stopping your regular medicines. This is especially important if you are taking diabetes medicines or blood thinners. °¨ Taking medicines such as aspirin and ibuprofen. These medicines can thin your blood. Do not take these medicines before your procedure if your health care  provider instructs you not to. °¨ Taking a cough suppressant if you have a frequent cough or coughing episodes. °· Plan to have someone take you home after the procedure. °What happens during the procedure? °· You will be asked to sit upright and lean slightly forward for the procedure. °· An area of your back will be cleaned with a germ-killing solution (antiseptic). °· You will be given a medicine that numbs the area (local anesthetic). °· A needle will be inserted between your ribs and into the pleural space. You may feel pressure or slight pain as the needle is positioned into the pleural space. °· Fluid will be removed from the pleural space through the needle. You may feel pressure as the fluid is removed. °· The needle will be taken out after the excess fluid has been removed. A sample of the fluid may be sent to be examined. °· The needle insertion site (puncture site) will be covered with a bandage (dressing). °The procedure may vary among health care providers and hospitals. °What happens after the procedure? °· A chest X-ray may be done to check the amount of fluid that remains in your pleural space. °· Your blood pressure, heart rate, breathing rate, and blood oxygen level will be monitored often until the medicines you were given have worn off. °· It is your responsibility to obtain your test results. Ask the lab or department performing the test when and how you will get your results. Talk with your health care provider if you have any questions about your results. °This   information is not intended to replace advice given to you by your health care provider. Make sure you discuss any questions you have with your health care provider. °Document Released: 05/21/2005 Document Revised: 07/07/2016 Document Reviewed: 08/17/2014 °Elsevier Interactive Patient Education © 2017 [employer] ° °

## 2016-11-20 NOTE — Progress Notes (Signed)
Thoracentesis complete no signs of distress. 839ml of yellow colored pleural fluid removed

## 2016-11-21 ENCOUNTER — Ambulatory Visit (HOSPITAL_BASED_OUTPATIENT_CLINIC_OR_DEPARTMENT_OTHER): Payer: BLUE CROSS/BLUE SHIELD | Admitting: Anesthesiology

## 2016-11-21 ENCOUNTER — Ambulatory Visit (HOSPITAL_COMMUNITY): Payer: BLUE CROSS/BLUE SHIELD

## 2016-11-21 ENCOUNTER — Encounter (HOSPITAL_BASED_OUTPATIENT_CLINIC_OR_DEPARTMENT_OTHER)
Admission: RE | Disposition: A | Discharge status: Home / Self Care | Payer: Self-pay | Source: Ambulatory Visit | Attending: General Surgery

## 2016-11-21 ENCOUNTER — Ambulatory Visit (HOSPITAL_BASED_OUTPATIENT_CLINIC_OR_DEPARTMENT_OTHER)
Admission: RE | Admit: 2016-11-21 | Discharge: 2016-11-21 | Disposition: A | Discharge status: Home / Self Care | Payer: BLUE CROSS/BLUE SHIELD | Source: Ambulatory Visit | Attending: General Surgery | Admitting: General Surgery

## 2016-11-21 ENCOUNTER — Encounter (HOSPITAL_BASED_OUTPATIENT_CLINIC_OR_DEPARTMENT_OTHER): Payer: Self-pay

## 2016-11-21 DIAGNOSIS — Z9102 Food additives allergy status: Secondary | ICD-10-CM | POA: Diagnosis not present

## 2016-11-21 DIAGNOSIS — C8510 Unspecified B-cell lymphoma, unspecified site: Secondary | ICD-10-CM | POA: Diagnosis present

## 2016-11-21 DIAGNOSIS — F419 Anxiety disorder, unspecified: Secondary | ICD-10-CM | POA: Insufficient documentation

## 2016-11-21 DIAGNOSIS — J90 Pleural effusion, not elsewhere classified: Secondary | ICD-10-CM | POA: Diagnosis not present

## 2016-11-21 DIAGNOSIS — J45909 Unspecified asthma, uncomplicated: Secondary | ICD-10-CM | POA: Diagnosis not present

## 2016-11-21 DIAGNOSIS — Z91013 Allergy to seafood: Secondary | ICD-10-CM | POA: Diagnosis not present

## 2016-11-21 DIAGNOSIS — C8330 Diffuse large B-cell lymphoma, unspecified site: Secondary | ICD-10-CM | POA: Diagnosis present

## 2016-11-21 DIAGNOSIS — N6310 Unspecified lump in the right breast, unspecified quadrant: Secondary | ICD-10-CM | POA: Insufficient documentation

## 2016-11-21 DIAGNOSIS — Z8619 Personal history of other infectious and parasitic diseases: Secondary | ICD-10-CM | POA: Diagnosis not present

## 2016-11-21 DIAGNOSIS — Z87891 Personal history of nicotine dependence: Secondary | ICD-10-CM | POA: Insufficient documentation

## 2016-11-21 DIAGNOSIS — Z419 Encounter for procedure for purposes other than remedying health state, unspecified: Secondary | ICD-10-CM

## 2016-11-21 DIAGNOSIS — Z885 Allergy status to narcotic agent status: Secondary | ICD-10-CM | POA: Diagnosis not present

## 2016-11-21 DIAGNOSIS — Z888 Allergy status to other drugs, medicaments and biological substances status: Secondary | ICD-10-CM | POA: Insufficient documentation

## 2016-11-21 DIAGNOSIS — Z79899 Other long term (current) drug therapy: Secondary | ICD-10-CM | POA: Diagnosis not present

## 2016-11-21 DIAGNOSIS — Z95828 Presence of other vascular implants and grafts: Secondary | ICD-10-CM

## 2016-11-21 HISTORY — DX: Presence of dental prosthetic device (complete) (partial): Z97.2

## 2016-11-21 HISTORY — DX: Unspecified cataract: H26.9

## 2016-11-21 HISTORY — DX: Allergic rhinitis due to animal (cat) (dog) hair and dander: J30.81

## 2016-11-21 HISTORY — DX: Hyperesthesia: R20.3

## 2016-11-21 HISTORY — PX: PORTACATH PLACEMENT: SHX2246

## 2016-11-21 HISTORY — DX: Personal history of other diseases of the respiratory system: Z87.09

## 2016-11-21 HISTORY — DX: Personal history of other infectious and parasitic diseases: Z86.19

## 2016-11-21 SURGERY — INSERTION, TUNNELED CENTRAL VENOUS DEVICE, WITH PORT
Anesthesia: General | Site: Chest | Laterality: Left

## 2016-11-21 MED ORDER — LIDOCAINE HCL (PF) 1 % IJ SOLN
INTRAMUSCULAR | Status: AC
  Filled 2016-11-21: qty 30

## 2016-11-21 MED ORDER — HYDROMORPHONE HCL 1 MG/ML IJ SOLN: 0.2500 mg | INTRAMUSCULAR | Status: DC | PRN

## 2016-11-21 MED ORDER — BUPIVACAINE HCL 0.5 % IJ SOLN
INTRAMUSCULAR | Status: DC | PRN
  Administered 2016-11-21: 10 mL

## 2016-11-21 MED ORDER — IOPAMIDOL (ISOVUE-300) INJECTION 61%
INTRAVENOUS | Status: AC
  Filled 2016-11-21: qty 50

## 2016-11-21 MED ORDER — SCOPOLAMINE 1 MG/3DAYS TD PT72: 1.0000 | MEDICATED_PATCH | Freq: Once | TRANSDERMAL | Status: DC | PRN

## 2016-11-21 MED ORDER — ONDANSETRON HCL 4 MG/2ML IJ SOLN
INTRAMUSCULAR | Status: DC | PRN
  Administered 2016-11-21: 4 mg via INTRAVENOUS

## 2016-11-21 MED ORDER — FENTANYL CITRATE (PF) 100 MCG/2ML IJ SOLN
50.0000 ug | INTRAMUSCULAR | Status: DC | PRN
  Administered 2016-11-21 (×2): 50 ug via INTRAVENOUS

## 2016-11-21 MED ORDER — HEPARIN (PORCINE) IN NACL 2-0.9 UNIT/ML-% IJ SOLN
INTRAMUSCULAR | Status: DC | PRN
  Administered 2016-11-21: 1 via INTRAVENOUS

## 2016-11-21 MED ORDER — PROPOFOL 10 MG/ML IV BOLUS
INTRAVENOUS | Status: AC
  Filled 2016-11-21: qty 20

## 2016-11-21 MED ORDER — FENTANYL CITRATE (PF) 100 MCG/2ML IJ SOLN: 25.0000 ug | INTRAMUSCULAR | Status: DC | PRN

## 2016-11-21 MED ORDER — HEPARIN SOD (PORK) LOCK FLUSH 100 UNIT/ML IV SOLN
INTRAVENOUS | Status: DC | PRN
  Administered 2016-11-21: 500 [IU] via INTRAVENOUS

## 2016-11-21 MED ORDER — CEFAZOLIN SODIUM-DEXTROSE 2-4 GM/100ML-% IV SOLN: 2.0000 g | INTRAVENOUS | Status: DC

## 2016-11-21 MED ORDER — PROPOFOL 10 MG/ML IV BOLUS
INTRAVENOUS | Status: DC | PRN
  Administered 2016-11-21: 150 mg via INTRAVENOUS

## 2016-11-21 MED ORDER — MIDAZOLAM HCL 2 MG/2ML IJ SOLN
INTRAMUSCULAR | Status: AC
  Filled 2016-11-21: qty 2

## 2016-11-21 MED ORDER — LACTATED RINGERS IV SOLN: INTRAVENOUS | Status: DC

## 2016-11-21 MED ORDER — LIDOCAINE 2% (20 MG/ML) 5 ML SYRINGE
INTRAMUSCULAR | Status: AC
  Filled 2016-11-21: qty 5

## 2016-11-21 MED ORDER — MIDAZOLAM HCL 2 MG/2ML IJ SOLN
1.0000 mg | INTRAMUSCULAR | Status: DC | PRN
  Administered 2016-11-21: 2 mg via INTRAVENOUS

## 2016-11-21 MED ORDER — HEPARIN SOD (PORK) LOCK FLUSH 100 UNIT/ML IV SOLN
INTRAVENOUS | Status: AC
  Filled 2016-11-21: qty 5

## 2016-11-21 MED ORDER — SODIUM CHLORIDE 0.9 % IV SOLN: 250.0000 mL | INTRAVENOUS | Status: DC | PRN

## 2016-11-21 MED ORDER — DEXAMETHASONE SODIUM PHOSPHATE 4 MG/ML IJ SOLN
INTRAMUSCULAR | Status: DC | PRN
  Administered 2016-11-21: 10 mg via INTRAVENOUS

## 2016-11-21 MED ORDER — BUPIVACAINE HCL (PF) 0.5 % IJ SOLN
INTRAMUSCULAR | Status: AC
  Filled 2016-11-21: qty 60

## 2016-11-21 MED ORDER — OXYCODONE HCL 5 MG PO TABS: 5.0000 mg | ORAL_TABLET | ORAL | Status: DC | PRN

## 2016-11-21 MED ORDER — LACTATED RINGERS IV SOLN
INTRAVENOUS | Status: DC
  Administered 2016-11-21: 12:00:00 via INTRAVENOUS

## 2016-11-21 MED ORDER — ONDANSETRON HCL 4 MG/2ML IJ SOLN
INTRAMUSCULAR | Status: AC
  Filled 2016-11-21: qty 2

## 2016-11-21 MED ORDER — DEXAMETHASONE SODIUM PHOSPHATE 10 MG/ML IJ SOLN
INTRAMUSCULAR | Status: AC
  Filled 2016-11-21: qty 1

## 2016-11-21 MED ORDER — SODIUM CHLORIDE 0.9% FLUSH: 3.0000 mL | INTRAVENOUS | Status: DC | PRN

## 2016-11-21 MED ORDER — ONDANSETRON HCL 4 MG/2ML IJ SOLN: 4.0000 mg | Freq: Once | INTRAMUSCULAR | Status: DC | PRN

## 2016-11-21 MED ORDER — FENTANYL CITRATE (PF) 100 MCG/2ML IJ SOLN
INTRAMUSCULAR | Status: AC
  Filled 2016-11-21: qty 2

## 2016-11-21 MED ORDER — SODIUM CHLORIDE 0.9% FLUSH: 3.0000 mL | Freq: Two times a day (BID) | INTRAVENOUS | Status: DC

## 2016-11-21 MED ORDER — CHLORHEXIDINE GLUCONATE CLOTH 2 % EX PADS: 6.0000 | MEDICATED_PAD | Freq: Once | CUTANEOUS | Status: DC

## 2016-11-21 MED ORDER — MEPERIDINE HCL 25 MG/ML IJ SOLN: 6.2500 mg | INTRAMUSCULAR | Status: DC | PRN

## 2016-11-21 MED ORDER — ACETAMINOPHEN 650 MG RE SUPP: 650.0000 mg | RECTAL | Status: DC | PRN

## 2016-11-21 MED ORDER — LIDOCAINE 2% (20 MG/ML) 5 ML SYRINGE
INTRAMUSCULAR | Status: DC | PRN
  Administered 2016-11-21: 100 mg via INTRAVENOUS

## 2016-11-21 MED ORDER — ACETAMINOPHEN 325 MG PO TABS: 650.0000 mg | ORAL_TABLET | ORAL | Status: DC | PRN

## 2016-11-21 MED ORDER — HEPARIN (PORCINE) IN NACL 2-0.9 UNIT/ML-% IJ SOLN
INTRAMUSCULAR | Status: AC
  Filled 2016-11-21: qty 500

## 2016-11-21 SURGICAL SUPPLY — 55 items
BAG DECANTER FOR FLEXI CONT (MISCELLANEOUS) ×3 IMPLANT
BENZOIN TINCTURE PRP APPL 2/3 (GAUZE/BANDAGES/DRESSINGS) IMPLANT
BLADE HEX COATED 2.75 (ELECTRODE) ×3 IMPLANT
BLADE SURG 15 STRL LF DISP TIS (BLADE) ×1 IMPLANT
BLADE SURG 15 STRL SS (BLADE) ×2
CANISTER SUCT 1200ML W/VALVE (MISCELLANEOUS) IMPLANT
CHLORAPREP W/TINT 26ML (MISCELLANEOUS) ×3 IMPLANT
CLOSURE WOUND 1/2 X4 (GAUZE/BANDAGES/DRESSINGS)
COVER BACK TABLE 60X90IN (DRAPES) ×3 IMPLANT
COVER MAYO STAND STRL (DRAPES) ×3 IMPLANT
COVER PROBE 5X48 (MISCELLANEOUS) ×2
DECANTER SPIKE VIAL GLASS SM (MISCELLANEOUS) IMPLANT
DERMABOND ADVANCED (GAUZE/BANDAGES/DRESSINGS) ×2
DERMABOND ADVANCED .7 DNX12 (GAUZE/BANDAGES/DRESSINGS) ×1 IMPLANT
DRAPE C-ARM 42X72 X-RAY (DRAPES) ×3 IMPLANT
DRAPE LAPAROSCOPIC ABDOMINAL (DRAPES) ×3 IMPLANT
DRAPE UTILITY XL STRL (DRAPES) ×3 IMPLANT
DRSG TEGADERM 2-3/8X2-3/4 SM (GAUZE/BANDAGES/DRESSINGS) IMPLANT
DRSG TEGADERM 4X10 (GAUZE/BANDAGES/DRESSINGS) IMPLANT
DRSG TEGADERM 4X4.75 (GAUZE/BANDAGES/DRESSINGS) IMPLANT
ELECT REM PT RETURN 9FT ADLT (ELECTROSURGICAL) ×3
ELECTRODE REM PT RTRN 9FT ADLT (ELECTROSURGICAL) ×1 IMPLANT
GLOVE BIOGEL PI IND STRL 7.0 (GLOVE) ×2 IMPLANT
GLOVE BIOGEL PI INDICATOR 7.0 (GLOVE) ×4
GLOVE EUDERMIC 7 POWDERFREE (GLOVE) ×3 IMPLANT
GLOVE SURG SYN 7.5  E (GLOVE) ×2
GLOVE SURG SYN 7.5 E (GLOVE) ×1 IMPLANT
GOWN STRL REUS W/ TWL LRG LVL3 (GOWN DISPOSABLE) ×1 IMPLANT
GOWN STRL REUS W/ TWL XL LVL3 (GOWN DISPOSABLE) ×1 IMPLANT
GOWN STRL REUS W/TWL LRG LVL3 (GOWN DISPOSABLE) ×2
GOWN STRL REUS W/TWL XL LVL3 (GOWN DISPOSABLE) ×2
IV CATH PLACEMENT UNIT 16 GA (IV SOLUTION) IMPLANT
IV KIT MINILOC 20X1 SAFETY (NEEDLE) IMPLANT
KIT CVR 48X5XPRB PLUP LF (MISCELLANEOUS) ×1 IMPLANT
KIT PORT POWER 8FR ISP CVUE (Catheter) ×3 IMPLANT
NEEDLE BLUNT 17GA (NEEDLE) IMPLANT
NEEDLE HYPO 22GX1.5 SAFETY (NEEDLE) ×3 IMPLANT
NEEDLE HYPO 25X1 1.5 SAFETY (NEEDLE) ×3 IMPLANT
PACK BASIN DAY SURGERY FS (CUSTOM PROCEDURE TRAY) ×3 IMPLANT
PENCIL BUTTON HOLSTER BLD 10FT (ELECTRODE) ×3 IMPLANT
SET SHEATH INTRODUCER 10FR (MISCELLANEOUS) IMPLANT
SHEATH COOK PEEL AWAY SET 9F (SHEATH) IMPLANT
SLEEVE SCD COMPRESS KNEE MED (MISCELLANEOUS) ×3 IMPLANT
SPONGE GAUZE 4X4 12PLY STER LF (GAUZE/BANDAGES/DRESSINGS) IMPLANT
STRIP CLOSURE SKIN 1/2X4 (GAUZE/BANDAGES/DRESSINGS) IMPLANT
SUT MNCRL AB 4-0 PS2 18 (SUTURE) ×3 IMPLANT
SUT PROLENE 2 0 CT2 30 (SUTURE) ×3 IMPLANT
SUT VICRYL 3-0 CR8 SH (SUTURE) ×3 IMPLANT
SYR 10ML LL (SYRINGE) ×3 IMPLANT
SYR 5ML LUER SLIP (SYRINGE) ×3 IMPLANT
TOWEL OR 17X24 6PK STRL BLUE (TOWEL DISPOSABLE) ×6 IMPLANT
TOWEL OR NON WOVEN STRL DISP B (DISPOSABLE) ×3 IMPLANT
TUBE CONNECTING 20'X1/4 (TUBING)
TUBE CONNECTING 20X1/4 (TUBING) IMPLANT
YANKAUER SUCT BULB TIP NO VENT (SUCTIONS) IMPLANT

## 2016-11-21 NOTE — Anesthesia Procedure Notes (Signed)
Procedure Name: LMA Insertion Date/Time: 11/21/2016 1:02 PM Performed by: Lyndee Leo Pre-anesthesia Checklist: Patient identified, Emergency Drugs available, Suction available and Patient being monitored Patient Re-evaluated:Patient Re-evaluated prior to inductionOxygen Delivery Method: Circle system utilized Preoxygenation: Pre-oxygenation with 100% oxygen Intubation Type: IV induction Ventilation: Mask ventilation without difficulty LMA: LMA inserted LMA Size: 4.0 Number of attempts: 1 Airway Equipment and Method: Bite block Placement Confirmation: positive ETCO2 Tube secured with: Tape Dental Injury: Teeth and Oropharynx as per pre-operative assessment

## 2016-11-21 NOTE — Transfer of Care (Signed)
Immediate Anesthesia Transfer of Care Note  Patient: Environmental health practitioner  Procedure(s) Performed: Procedure(s): INSERTION PORT-A-CATH WITH [place] (Left)  Patient Location: PACU  Anesthesia Type:General  Level of Consciousness: awake, oriented and patient cooperative  Airway & Oxygen Therapy: Patient Spontanous Breathing and Patient connected to face mask oxygen  Post-op Assessment: Report given to RN and Post -op Vital signs reviewed and stable  Post vital signs: Reviewed and stable  Last Vitals:  Vitals:   11/21/16 1134  BP: 119/73  Pulse: 93  Resp: 18  Temp: 36.8 C    Last Pain:  Vitals:   11/21/16 1134  TempSrc: Oral  PainSc: 3       Patients Stated Pain Goal: 3 ([id number])  Complications: No apparent anesthesia complications

## 2016-11-21 NOTE — Interval H&P Note (Signed)
History and Physical Interval Note:  11/21/2016 12:20 PM  Monica Gray  has presented today for surgery, with the diagnosis of LYMPHOMA  The various methods of treatment have been discussed with the patient and family. After consideration of risks, benefits and other options for treatment, the patient has consented to  Procedure(s): INSERTION PORT-A-CATH WITH [place] (N/A) as a surgical intervention .  The patient's history has been reviewed, patient examined, no change in status, stable for surgery.  I have reviewed the patient's chart and labs.  Questions were answered to the patient's satisfaction.     Adin Hector

## 2016-11-21 NOTE — Discharge Instructions (Signed)
Post Anesthesia Home Care Instructions  Activity: Get plenty of rest for the remainder of the day. A responsible adult should stay with you for 24 hours following the procedure.  For the next 24 hours, DO NOT: -Drive a car -Paediatric nurse -Drink alcoholic beverages -Take any medication unless instructed by your physician -Make any legal decisions or sign important papers.  Meals: Start with liquid foods such as gelatin or soup. Progress to regular foods as tolerated. Avoid greasy, spicy, heavy foods. If nausea and/or vomiting occur, drink only clear liquids until the nausea and/or vomiting subsides. Call your physician if vomiting continues.  Special Instructions/Symptoms: Your throat may feel dry or sore from the anesthesia or the breathing tube placed in your throat during surgery. If this causes discomfort, gargle with warm salt water. The discomfort should disappear within 24 hours.  If you had a scopolamine patch placed behind your ear for the management of post- operative nausea and/or vomiting:  1. The medication in the patch is effective for 72 hours, after which it should be removed.  Wrap patch in a tissue and discard in the trash. Wash hands thoroughly with soap and water. 2. You may remove the patch earlier than 72 hours if you experience unpleasant side effects which may include dry mouth, dizziness or visual disturbances. 3. Avoid touching the patch. Wash your hands with soap and water after contact with the patch.   Call your surgeon if you experience:   1.  Fever over 101.0. 2.  Inability to urinate. 3.  Nausea and/or vomiting. 4.  Extreme swelling or bruising at the surgical site. 5.  Continued bleeding from the incision. 6.  Increased pain, redness or drainage from the incision. 7.  Problems related to your pain medication. 8.  Any problems and/or concerns   PORT-A-CATH: POST OP INSTRUCTIONS  Always review your discharge instruction sheet given to you by  the facility where your surgery was performed.   1. A prescription for pain medication may be given to you upon discharge. Take your pain medication as prescribed, if needed. If narcotic pain medicine is not needed, then you make take acetaminophen (Tylenol) or ibuprofen (Advil) as needed.  2. Take your usually prescribed medications unless otherwise directed. 3. If you need a refill on your pain medication, please contact our office. All narcotic pain medicine now requires a paper prescription.  Phoned in and fax refills are no longer allowed by law.  Prescriptions will not be filled after 5 pm or on weekends.  4. You should follow a light diet for the remainder of the day after your procedure. 5. Most patients will experience some mild swelling and/or bruising in the area of the incision. It may take several days to resolve. 6. It is common to experience some constipation if taking pain medication after surgery. Increasing fluid intake and taking a stool softener (such as Colace) will usually help or prevent this problem from occurring. A mild laxative (Milk of Magnesia or Miralax) should be taken according to package directions if there are no bowel movements after 48 hours.  7. Unless discharge instructions indicate otherwise, you may remove your bandages 48 hours after surgery, and you may shower at that time. You may have steri-strips (small white skin tapes) in place directly over the incision.  These strips should be left on the skin for 7-10 days.  If your surgeon used Dermabond (skin glue) on the incision, you may shower in 24 hours.  The glue will flake  off over the next 2-3 weeks.  8. If your port is left accessed at the end of surgery (needle left in port), the dressing cannot get wet and should only by changed by a healthcare professional. When the port is no longer accessed (when the needle has been removed), follow step 7.   9. ACTIVITIES:  Limit activity involving your arms for the next 72  hours. Do no strenuous exercise or activity for 1 week. You may drive when you are no longer taking prescription pain medication, you can comfortably wear a seatbelt, and you can maneuver your car. 10.You may need to see your doctor in the office for a follow-up appointment.  Please       check with your doctor.  11.When you receive a new Port-a-Cath, you will get a product guide and        ID card.  Please keep them in case you need them.  WHEN TO CALL YOUR DOCTOR [phone]): 1. Fever over 101.0 2. Chills 3. Continued bleeding from incision 4. Increased redness and tenderness at the site 5. Shortness of breath, difficulty breathing   The clinic staff is available to answer your questions during regular business hours. Please dont hesitate to call and ask to speak to one of the nurses or medical assistants for clinical concerns. If you have a medical emergency, go to the nearest emergency room or call 911.  A surgeon from [facility] is always on call at the hospital.     For further information, please visit [email or web address]        You have a new prescription for pain medicine at home.  Use that if you have any discomfort from the Port-A-Cath insertion.

## 2016-11-21 NOTE — Op Note (Signed)
Patient Name:           Monica Gray   Date of Surgery:        11/21/2016  Pre op Diagnosis:     Large B-cell lymphoma  Post op Diagnosis:    Large B-cell lymphoma  Procedure:                  Insertion of PowerPort point ClearVue 8 French tunneled venous vascular access device with subcutaneous port                                       Use of fluoroscopy for guidance and positioning  Surgeon:                     Edsel Petrin. Dalbert Batman, M.D., FACS  Assistant:                      OR staff   Indication for Assistant: n/a  Operative Indications:   .Marland Kitchen This is a 66 year old Caucasian female who was here with her husband. She is here for a postop visit following right cervical lymph node biopsy and to discuss her right breast and right axillary mass.  She is in good spirits. She had a good encounter with Dr. Whitney Muse and is happy to know more about her diagnosis and what the future is going to look like.      Initially referred by Dr. Jacelyn Pi for consideration of right neck lymph node biopsy to rule out lymphoma. Her PCP is Dr.A. Manuella Ghazi on [place]. . Her oncologist is now Dr. Ancil Linsey in [place].       Her right neck cervical lymph node biopsy in the posterior triangle reveals large B-cell lymphoma. She has not had any problems with wound healing. She has no shoulder pain. No evidence of sensory or motor deficit.       Her CT scans of the neck abdomen and chest showed diffuse adenopathy consistent with lymphoma. PET scan on November 09, 2016 shows active lymphoma within the neck, chest, abdomen, and pelvis. She has a right breast hypermetabolic nodule medially and a small hyperbolic right axillary lymph node. She had right larger than left pleural effusions. There is a 12 mm hypermetabolic liver lesion in the lateral segment of the left lobe consistent with lymphoma.       Iliac bone marrow aspiration and biopsy was performed on November 14, 2016, results pending.       I  referred her for breast imaging studies and they found a 1.1 cm mass in the right breast at the 2:30 position 3 cm from the nipple an abnormal right axillary lymph node. These were both biopsied today and results are pending. Dr. Whitney Muse is scheduling her for a thoracentesis. Dr. Whitney Muse canceled her pulmonary medicine consultation with Dr. Melvyn Novas, considering the information that we now have.       She is scheduled for Port-A-Cath insertion by me on January 3.        It looks like they're planning to initiate chemotherapy for the lymphoma the week of January 10.       I discussed the indications, details, techniques, and numerous risk of Port-A-Cath insertion with her and her husband. She is aware the risks of bleeding, infection, bilateral attempts, failure to get the port in, pneumothorax, air embolus, cardiac arrhythmia, and other  unforeseen palms. She understands all these issues. Over questions were answered. She agrees with this plan.  Operative Findings:       I was able to insert the catheter through the left subclavian vein without difficulty.  In the operating room fluoroscopy confirmed that the catheter tip was at the superior vena cava right atrial junction.  The catheter flushed easily and had excellent blood return.  Procedure in Detail:          Following the induction of general LMA anesthesia the patient was positioned with a small roll behind her shoulders and her arms tucked at her sides.  The neck and chest were prepped and draped in a sterile fashion.  Surgical timeout was performed.  Intravenous antibiotics were given.  0.5% Marcaine was used as local infiltration anesthetic.      A left subclavian venipuncture was performed with a single pass and a guidewire inserted into the superior vena cava under fluoroscopic guidance.  A small incision was made at the wire insertion site.  Using the C-arm and a marking pin I marked a template on the chest wall for positioning of the  catheter.  Transverse incision was made a couple of centimeters below the wire insertion site and a subcutaneous pocket created.  Using a tunneling device I passed the catheter from the port pocket site to the wire insertion site.  Using the template marking the chest wall I cut the catheter 23 cm in length.  The catheter was secured to the port with the locking device and flushed with heparinized saline.  The port was sutured to the pectoralis fascia with 3 interrupted sutures of 2-0 Prolene.  I passed the dilator and peel-away sheath assembly over the guidewire into the central venous circulation without difficulty.  The wire and dilator were removed, the catheter threaded, the peel-away sheath removed.  The catheter flushed easily and had excellent blood return.  The catheter was flushed with Concentrated heparin.  Fluoroscopy confirmed good positioning of the catheter without any deformity of the catheter along its length.  There was no bleeding.  The subcutaneous tissues were closed with 3-0 Vicryl sutures and the skin incisions closed with subcuticular 4-0 Monocryl and Dermabond.  The patient tolerated the procedure well and we taken to the PACU in stable condition where a chest x-ray will be obtained.  EBL 10 mL.  Counts correct.  Complications none.     Edsel Petrin. Dalbert Batman, M.D., FACS General and Minimally Invasive Surgery Breast and Colorectal Surgery  11/21/2016 1:40 PM

## 2016-11-21 NOTE — Anesthesia Preprocedure Evaluation (Signed)
Anesthesia Evaluation  Patient identified by MRN, date of birth, ID band Patient awake    Reviewed: Allergy & Precautions, NPO status , Patient's Chart, lab work & pertinent test results  Airway Mallampati: I  TM Distance: >3 FB Neck ROM: Full    Dental   Pulmonary former smoker,    Pulmonary exam normal        Cardiovascular Normal cardiovascular exam     Neuro/Psych    GI/Hepatic (+) Hepatitis -, C  Endo/Other    Renal/GU      Musculoskeletal   Abdominal   Peds  Hematology   Anesthesia Other Findings   Reproductive/Obstetrics                             Anesthesia Physical Anesthesia Plan  ASA: III  Anesthesia Plan: General   Post-op Pain Management:    Induction: Intravenous  Airway Management Planned:   Additional Equipment:   Intra-op Plan:   Post-operative Plan: Extubation in OR  Informed Consent: I have reviewed the patients History and Physical, chart, labs and discussed the procedure including the risks, benefits and alternatives for the proposed anesthesia with the patient or authorized representative who has indicated his/her understanding and acceptance.     Plan Discussed with: CRNA and Surgeon  Anesthesia Plan Comments:         Anesthesia Quick Evaluation

## 2016-11-21 NOTE — Anesthesia Postprocedure Evaluation (Signed)
Anesthesia Post Note  Patient: Environmental health practitioner  Procedure(s) Performed: Procedure(s) (LRB): INSERTION PORT-A-CATH WITH [place] (Left)  Patient location during evaluation: PACU Anesthesia Type: General Level of consciousness: awake and alert Pain management: pain level controlled Vital Signs Assessment: post-procedure vital signs reviewed and stable Respiratory status: spontaneous breathing, nonlabored ventilation, respiratory function stable and patient connected to nasal cannula oxygen Cardiovascular status: blood pressure returned to baseline and stable Postop Assessment: no signs of nausea or vomiting Anesthetic complications: no       Last Vitals:  Vitals:   11/21/16 1346 11/21/16 1400  BP: 126/80 117/80  Pulse: 89 84  Resp: 16 17  Temp: 36.4 C     Last Pain:  Vitals:   11/21/16 1346  TempSrc:   PainSc: 0-No pain                 [place] DAVID

## 2016-11-22 ENCOUNTER — Encounter (HOSPITAL_BASED_OUTPATIENT_CLINIC_OR_DEPARTMENT_OTHER): Payer: Self-pay | Admitting: General Surgery

## 2016-11-22 ENCOUNTER — Other Ambulatory Visit (HOSPITAL_COMMUNITY): Payer: Self-pay | Admitting: Hematology & Oncology

## 2016-11-22 NOTE — Progress Notes (Signed)
START ON PATHWAY REGIMEN - Lymphoma and CLL  LYOS231: R-CHOP q21 Days x 6 Cycles   A cycle is every 21 days:     Rituximab (Rituxan(R)) 375 mg/m2 in _____ mL NS IV day 1 only. Dose Mod: None     Cyclophosphamide (Cytoxan(R)) 750 mg/m2 in 250 mL NS IV over 30 minutes day 1 only Dose Mod: None     Doxorubicin (Adriamycin(R)) 50 mg/m2 IV push day 1 only Dose Mod: None     Vincristine (Oncovin(R)) 1.4 mg/m2; MAX 2 mg in 50 mL normal saline IV over 20 minutes on day 1 only. (MAX DOSE 2 mg) Dose Mod: None     Prednisone 100 mg flat dose orally daily days 1,2,3,4,5. Dose Mod: None Additional Orders: Hepatitis B&C testing recommended prior to rituximab use on all patients. Final concentration of rituximab must be between 1 and 4 mg/mL.  **Always confirm dose/schedule in your pharmacy ordering system**    Patient Characteristics: Diffuse Large B Cell, First Line, Stage III and IV Disease Type: Not Applicable Disease Type: Diffuse Large B Cell Line of therapy: First Line [place] Stage: IIIB  Intent of Therapy: Curative Intent, Discussed with Patient

## 2016-11-23 ENCOUNTER — Encounter (HOSPITAL_COMMUNITY): Payer: Self-pay | Admitting: Emergency Medicine

## 2016-11-23 MED ORDER — ONDANSETRON HCL 8 MG PO TABS: 8.0000 mg | ORAL_TABLET | Freq: Two times a day (BID) | ORAL | 1 refills | Status: DC | PRN

## 2016-11-23 MED ORDER — LIDOCAINE-PRILOCAINE 2.5-2.5 % EX CREA: TOPICAL_CREAM | CUTANEOUS | 3 refills | Status: DC

## 2016-11-23 MED ORDER — PROCHLORPERAZINE MALEATE 10 MG PO TABS: 10.0000 mg | ORAL_TABLET | Freq: Four times a day (QID) | ORAL | 6 refills | Status: DC | PRN

## 2016-11-23 MED ORDER — PREDNISONE 20 MG PO TABS: ORAL_TABLET | ORAL | 3 refills | Status: DC

## 2016-11-23 MED ORDER — ALLOPURINOL 300 MG PO TABS: 300.0000 mg | ORAL_TABLET | Freq: Every day | ORAL | 3 refills | Status: DC

## 2016-11-23 MED ORDER — ONDANSETRON HCL 8 MG PO TABS: 8.0000 mg | ORAL_TABLET | Freq: Three times a day (TID) | ORAL | 1 refills | Status: DC | PRN

## 2016-11-23 NOTE — Progress Notes (Signed)
Chemotherapy education pulled together and appts made 

## 2016-11-26 ENCOUNTER — Institutional Professional Consult (permissible substitution): Payer: BLUE CROSS/BLUE SHIELD | Admitting: Internal Medicine

## 2016-11-27 ENCOUNTER — Encounter: Payer: Self-pay | Admitting: *Deleted

## 2016-11-27 NOTE — Progress Notes (Signed)
[place] Psychosocial Distress Screening Clinical Social Work  Clinical Social Work was referred by distress screening protocol.  The patient scored a 9 on the Psychosocial Distress Thermometer which indicates severe distress. Clinical Social Worker reviewed chart, spoke with RN navigator and contacted pt at home to assess for distress and other psychosocial needs. CSW left brief, supportive message explaining role of CSW and how CSW could assist. CSW immediately received return call from pt. Pt has had a lot of financial concerns and CSW reviewed possible resources to assist. Pt is currently on an [employer] plan that may have a high deductible. CSW discussed possible assistance through Cancer care, [place] and DSS. Based on pt's income pt could possibly get food stamps. Pt may need copay assistance as well once she starts getting medical bills. CSW encouraged pt to meet with financial counselor and attend support programs. CSW to research additional resources for other assistance. CSW to follow.   ONCBCN DISTRESS SCREENING 11/15/2016  Screening Type Initial Screening  Distress experienced in past week (1-10) 9  Emotional problem type Depression;Nervousness/Anxiety;Adjusting to illness  Information Concerns Type Lack of info about diagnosis;Lack of info about treatment  Physical Problem type Pain;Nausea/vomiting;Sleep/insomnia;Loss of appetitie  Physician notified of physical symptoms Yes  Referral to support programs Yes  Referral to palliative care No    Clinical Social Worker follow up needed: Yes.    If yes, follow up plan:  See above Loren Racer, [place] Tuesdays   [phone]

## 2016-11-28 ENCOUNTER — Telehealth (HOSPITAL_COMMUNITY): Payer: Self-pay | Admitting: Emergency Medicine

## 2016-11-28 ENCOUNTER — Encounter (HOSPITAL_COMMUNITY): Payer: BLUE CROSS/BLUE SHIELD | Attending: Hematology & Oncology

## 2016-11-28 ENCOUNTER — Other Ambulatory Visit (HOSPITAL_COMMUNITY): Payer: BLUE CROSS/BLUE SHIELD

## 2016-11-28 DIAGNOSIS — J90 Pleural effusion, not elsewhere classified: Secondary | ICD-10-CM | POA: Insufficient documentation

## 2016-11-28 DIAGNOSIS — F419 Anxiety disorder, unspecified: Secondary | ICD-10-CM | POA: Insufficient documentation

## 2016-11-28 DIAGNOSIS — C8330 Diffuse large B-cell lymphoma, unspecified site: Secondary | ICD-10-CM | POA: Insufficient documentation

## 2016-11-28 DIAGNOSIS — C8338 Diffuse large B-cell lymphoma, lymph nodes of multiple sites: Secondary | ICD-10-CM

## 2016-11-28 DIAGNOSIS — B1920 Unspecified viral hepatitis C without hepatic coma: Secondary | ICD-10-CM | POA: Insufficient documentation

## 2016-11-28 DIAGNOSIS — J45909 Unspecified asthma, uncomplicated: Secondary | ICD-10-CM | POA: Insufficient documentation

## 2016-11-28 DIAGNOSIS — Z87891 Personal history of nicotine dependence: Secondary | ICD-10-CM | POA: Insufficient documentation

## 2016-11-28 MED ORDER — ONDANSETRON HCL 8 MG PO TABS: 8.0000 mg | ORAL_TABLET | Freq: Three times a day (TID) | ORAL | 1 refills | Status: DC | PRN

## 2016-11-28 MED ORDER — ALPRAZOLAM 0.25 MG PO TABS: ORAL_TABLET | ORAL | 0 refills | Status: DC

## 2016-11-28 NOTE — Progress Notes (Signed)
Consent signed for RCHOP.

## 2016-11-28 NOTE — Telephone Encounter (Signed)
Called pt to let her know that I called in Xanax 0.25 mg and she can take 2 tablets three times a day to see if that helps her anxiety some.  She will get some anxiety medication while she is here tomorrow for treatment.  I went over her daily medication again.  I also re-sent her zofran prescription to the pharmacy.  Pt verbalized understanding.

## 2016-11-29 ENCOUNTER — Other Ambulatory Visit (HOSPITAL_COMMUNITY): Payer: Self-pay | Admitting: Oncology

## 2016-11-29 ENCOUNTER — Encounter (HOSPITAL_BASED_OUTPATIENT_CLINIC_OR_DEPARTMENT_OTHER): Payer: BLUE CROSS/BLUE SHIELD

## 2016-11-29 ENCOUNTER — Encounter (HOSPITAL_COMMUNITY): Payer: Self-pay

## 2016-11-29 ENCOUNTER — Telehealth (HOSPITAL_COMMUNITY): Payer: Self-pay | Admitting: Hematology & Oncology

## 2016-11-29 VITALS — BP 115/68 | HR 83 | Temp 97.7°F | Resp 16 | Wt 121.1 lb

## 2016-11-29 DIAGNOSIS — C8338 Diffuse large B-cell lymphoma, lymph nodes of multiple sites: Secondary | ICD-10-CM | POA: Diagnosis not present

## 2016-11-29 DIAGNOSIS — F419 Anxiety disorder, unspecified: Secondary | ICD-10-CM | POA: Diagnosis not present

## 2016-11-29 DIAGNOSIS — C8330 Diffuse large B-cell lymphoma, unspecified site: Secondary | ICD-10-CM | POA: Diagnosis present

## 2016-11-29 DIAGNOSIS — Z5111 Encounter for antineoplastic chemotherapy: Secondary | ICD-10-CM

## 2016-11-29 DIAGNOSIS — Z87891 Personal history of nicotine dependence: Secondary | ICD-10-CM | POA: Diagnosis not present

## 2016-11-29 DIAGNOSIS — Z5112 Encounter for antineoplastic immunotherapy: Secondary | ICD-10-CM

## 2016-11-29 DIAGNOSIS — B1920 Unspecified viral hepatitis C without hepatic coma: Secondary | ICD-10-CM | POA: Diagnosis not present

## 2016-11-29 DIAGNOSIS — J45909 Unspecified asthma, uncomplicated: Secondary | ICD-10-CM | POA: Diagnosis not present

## 2016-11-29 DIAGNOSIS — J90 Pleural effusion, not elsewhere classified: Secondary | ICD-10-CM | POA: Diagnosis not present

## 2016-11-29 LAB — CBC WITH DIFFERENTIAL/PLATELET
BASOS PCT: 1 %
Basophils Absolute: 0 10*3/uL (ref 0.0–0.1)
EOS ABS: 0.2 10*3/uL (ref 0.0–0.7)
EOS PCT: 3 %
HCT: 34.8 % — ABNORMAL LOW (ref 36.0–46.0)
Hemoglobin: 11.9 g/dL — ABNORMAL LOW (ref 12.0–15.0)
LYMPHS ABS: 0.5 10*3/uL — AB (ref 0.7–4.0)
Lymphocytes Relative: 7 %
MCH: 29.1 pg (ref 26.0–34.0)
MCHC: 34.2 g/dL (ref 30.0–36.0)
MCV: 85.1 fL (ref 78.0–100.0)
MONOS PCT: 5 %
Monocytes Absolute: 0.3 10*3/uL (ref 0.1–1.0)
Neutro Abs: 5.5 10*3/uL (ref 1.7–7.7)
Neutrophils Relative %: 84 %
PLATELETS: 227 10*3/uL (ref 150–400)
RBC: 4.09 MIL/uL (ref 3.87–5.11)
RDW: 14 % (ref 11.5–15.5)
WBC: 6.5 10*3/uL (ref 4.0–10.5)

## 2016-11-29 LAB — COMPREHENSIVE METABOLIC PANEL
ALT: 10 U/L — ABNORMAL LOW (ref 14–54)
ANION GAP: 4 — AB (ref 5–15)
AST: 20 U/L (ref 15–41)
Albumin: 3.7 g/dL (ref 3.5–5.0)
Alkaline Phosphatase: 43 U/L (ref 38–126)
BUN: 14 mg/dL (ref 6–20)
CALCIUM: 9.3 mg/dL (ref 8.9–10.3)
CHLORIDE: 103 mmol/L (ref 101–111)
CO2: 28 mmol/L (ref 22–32)
Creatinine, Ser: 0.64 mg/dL (ref 0.44–1.00)
GFR calc Af Amer: >60 mL/min (ref >60)
GFR calc non Af Amer: >60 mL/min (ref >60)
Glucose, Bld: 109 mg/dL — ABNORMAL HIGH (ref 65–99)
POTASSIUM: 3.9 mmol/L (ref 3.5–5.1)
SODIUM: 135 mmol/L (ref 135–145)
Total Bilirubin: 0.5 mg/dL (ref 0.3–1.2)
Total Protein: 6.9 g/dL (ref 6.5–8.1)

## 2016-11-29 MED ORDER — SODIUM CHLORIDE 0.9 % IV SOLN
375.0000 mg/m2 | Freq: Once | INTRAVENOUS | Status: AC
  Administered 2016-11-29: 600 mg via INTRAVENOUS
  Filled 2016-11-29: qty 50

## 2016-11-29 MED ORDER — DEXAMETHASONE SODIUM PHOSPHATE 10 MG/ML IJ SOLN
10.0000 mg | Freq: Once | INTRAMUSCULAR | Status: AC
  Administered 2016-11-29: 10 mg via INTRAVENOUS
  Filled 2016-11-29: qty 1

## 2016-11-29 MED ORDER — SODIUM CHLORIDE 0.9 % IV SOLN: 10.0000 mg | Freq: Once | INTRAVENOUS | Status: DC

## 2016-11-29 MED ORDER — DIPHENHYDRAMINE HCL 25 MG PO CAPS: 50.0000 mg | ORAL_CAPSULE | Freq: Once | ORAL | Status: DC

## 2016-11-29 MED ORDER — LORAZEPAM 2 MG/ML IJ SOLN
1.0000 mg | Freq: Once | INTRAMUSCULAR | Status: AC
  Administered 2016-11-29: 1 mg via INTRAVENOUS

## 2016-11-29 MED ORDER — VINCRISTINE SULFATE CHEMO INJECTION 1 MG/ML
2.0000 mg | Freq: Once | INTRAVENOUS | Status: AC
  Administered 2016-11-29: 2 mg via INTRAVENOUS
  Filled 2016-11-29: qty 1

## 2016-11-29 MED ORDER — HEPARIN SOD (PORK) LOCK FLUSH 100 UNIT/ML IV SOLN
500.0000 [IU] | Freq: Once | INTRAVENOUS | Status: AC | PRN
  Administered 2016-11-29: 500 [IU]
  Filled 2016-11-29 (×2): qty 5

## 2016-11-29 MED ORDER — SODIUM CHLORIDE 0.9% FLUSH
10.0000 mL | INTRAVENOUS | Status: DC | PRN
  Administered 2016-11-29: 10 mL
  Filled 2016-11-29: qty 10

## 2016-11-29 MED ORDER — ACETAMINOPHEN 325 MG PO TABS: 650.0000 mg | ORAL_TABLET | Freq: Once | ORAL | Status: DC

## 2016-11-29 MED ORDER — SODIUM CHLORIDE 0.9 % IV SOLN
750.0000 mg/m2 | Freq: Once | INTRAVENOUS | Status: AC
  Administered 2016-11-29: 1180 mg via INTRAVENOUS
  Filled 2016-11-29: qty 25

## 2016-11-29 MED ORDER — LORAZEPAM 2 MG/ML IJ SOLN
INTRAMUSCULAR | Status: AC
  Filled 2016-11-29: qty 1

## 2016-11-29 MED ORDER — PALONOSETRON HCL INJECTION 0.25 MG/5ML
0.2500 mg | Freq: Once | INTRAVENOUS | Status: AC
  Administered 2016-11-29: 0.25 mg via INTRAVENOUS
  Filled 2016-11-29: qty 5

## 2016-11-29 MED ORDER — SODIUM CHLORIDE 0.9 % IV SOLN
Freq: Once | INTRAVENOUS | Status: AC
  Administered 2016-11-29: 09:00:00 via INTRAVENOUS

## 2016-11-29 MED ORDER — DOXORUBICIN HCL CHEMO IV INJECTION 2 MG/ML
50.0000 mg/m2 | Freq: Once | INTRAVENOUS | Status: AC
  Administered 2016-11-29: 78 mg via INTRAVENOUS
  Filled 2016-11-29: qty 39

## 2016-11-29 NOTE — Patient Instructions (Signed)
.  [facility] Discharge Instructions for Patients Receiving Chemotherapy   Beginning January 23rd 2017 lab work for the [facility] will be done in the  Main lab at [facility] on 1st floor. If you have a lab appointment with the [facility] please come in thru the  Main Entrance and check in at the main information desk   Today you received the following chemotherapy agents: Rituxan, Vincristine, Adriamycin, and cytoxan.   If you develop nausea and vomiting, or diarrhea that is not controlled by your medication, call the clinic.  The clinic phone number is [phone]. Office hours are Monday-Friday 8:30am-5:00pm.  BELOW ARE SYMPTOMS THAT SHOULD BE REPORTED IMMEDIATELY:  *FEVER GREATER THAN 101.0 F  *CHILLS WITH OR WITHOUT FEVER  NAUSEA AND VOMITING THAT IS NOT CONTROLLED WITH YOUR NAUSEA MEDICATION  *UNUSUAL SHORTNESS OF BREATH  *UNUSUAL BRUISING OR BLEEDING  TENDERNESS IN MOUTH AND THROAT WITH OR WITHOUT PRESENCE OF ULCERS  *URINARY PROBLEMS  *BOWEL PROBLEMS  UNUSUAL RASH Items with * indicate a potential emergency and should be followed up as soon as possible. If you have an emergency after office hours please contact your primary care physician or go to the nearest emergency department.  Please call the clinic during office hours if you have any questions or concerns.   You may also contact the Patient Navigator at [phone] should you have any questions or need assistance in obtaining follow up care.      Resources For Cancer Patients and their Caregivers ? American Cancer Society: Can assist with transportation, wigs, general needs, runs Look Good Feel Better.        (228)663-4530 ? Cancer Care: Provides financial assistance, online support groups, medication/co-pay assistance.  1-800-813-HOPE (336)403-4608) ? [place] Assists [place] cancer patients and their families through emotional , educational and  financial support.  660 879 2336 ? [place] DSS Where to apply for food stamps, Medicaid and utility assistance. 803-647-5766 ? RCATS: Transportation to medical appointments. (650) 158-1009 ? Social Security Administration: May apply for disability if have a Stage IV cancer. ([phone] 820-836-1179 ? [employer], Disability and Transit Services: Assists with nutrition, care and transit needs. 8387489115

## 2016-11-29 NOTE — Progress Notes (Signed)
Went to check on pt during her first chemotherapy this morning.  Pt seems to be doing better than yesterday during chemotherapy teaching.  She is still anxious but not as bad.  Her husband is at the bedside with her.  It is their anniversary today.

## 2016-11-29 NOTE — Telephone Encounter (Signed)
APPROVED FOR RITUXAN COPAY CARD 11/29/16-11/28/17 PT ID [id number] MAX BEN $ 25,000/YR  NEULASTA MASTER CARD  11/29/16-12/2018 NL [id number]

## 2016-11-29 NOTE — Progress Notes (Signed)
Patient tolerated infusion well.  VSS.  Patient and husband educated that she is unable to get the flu vaccination or the pneumonia vaccination because of poor immune response per Dr.  Muse.  Patient ambulatory and stable upon discharge from the clinic.

## 2016-11-29 NOTE — Progress Notes (Signed)
Patient called and informed that we have to give her a neulasta injection tomorrow at 1530.  She verbalized understanding and will return tomorrow for injection.

## 2016-11-30 ENCOUNTER — Encounter (HOSPITAL_BASED_OUTPATIENT_CLINIC_OR_DEPARTMENT_OTHER): Payer: BLUE CROSS/BLUE SHIELD

## 2016-11-30 ENCOUNTER — Encounter (HOSPITAL_COMMUNITY): Payer: Self-pay

## 2016-11-30 VITALS — BP 118/76 | HR 81 | Temp 98.1°F | Resp 16

## 2016-11-30 DIAGNOSIS — Z5189 Encounter for other specified aftercare: Secondary | ICD-10-CM

## 2016-11-30 DIAGNOSIS — C8338 Diffuse large B-cell lymphoma, lymph nodes of multiple sites: Secondary | ICD-10-CM | POA: Diagnosis not present

## 2016-11-30 MED ORDER — HEPARIN SOD (PORK) LOCK FLUSH 100 UNIT/ML IV SOLN
INTRAVENOUS | Status: AC
  Filled 2016-11-30: qty 5

## 2016-11-30 MED ORDER — PEGFILGRASTIM INJECTION 6 MG/0.6ML ~~LOC~~
PREFILLED_SYRINGE | SUBCUTANEOUS | Status: AC
  Filled 2016-11-30: qty 0.6

## 2016-11-30 MED ORDER — SODIUM CHLORIDE 0.9 % IV SOLN
6.0000 mg | Freq: Once | INTRAVENOUS | Status: AC
  Administered 2016-11-30: 6 mg via INTRAVENOUS
  Filled 2016-11-30: qty 4

## 2016-11-30 MED ORDER — HEPARIN SOD (PORK) LOCK FLUSH 100 UNIT/ML IV SOLN
500.0000 [IU] | Freq: Once | INTRAVENOUS | Status: AC
  Administered 2016-11-30: 500 [IU] via INTRAVENOUS

## 2016-11-30 MED ORDER — PEGFILGRASTIM INJECTION 6 MG/0.6ML ~~LOC~~
6.0000 mg | PREFILLED_SYRINGE | Freq: Once | SUBCUTANEOUS | Status: AC
  Administered 2016-11-30: 6 mg via SUBCUTANEOUS

## 2016-11-30 MED ORDER — SODIUM CHLORIDE 0.9 % IV SOLN
INTRAVENOUS | Status: DC
  Administered 2016-11-30: 16:00:00 via INTRAVENOUS

## 2016-11-30 NOTE — Progress Notes (Signed)
Monica Gray presents today for injection per MD orders. Neulasta 6mg  administered SQ in right Abdomen. Administration without incident. Patient tolerated well.  Elitek given today per orders. Vitals stable and discharged from clinic ambulatory.follow up as scheduled.

## 2016-11-30 NOTE — Patient Instructions (Signed)
[place] at [facility] Discharge Instructions  RECOMMENDATIONS MADE BY THE CONSULTANT AND ANY TEST RESULTS WILL BE SENT TO YOUR REFERRING PHYSICIAN.  Neulasta and elitek given today. Follow up as scheduled.  Thank you for choosing [place] at [facility] to provide your oncology and hematology care.  To afford each patient quality time with our provider, please arrive at least 15 minutes before your scheduled appointment time.    If you have a lab appointment with the [facility] please come in thru the  Main Entrance and check in at the main information desk  You need to re-schedule your appointment should you arrive 10 or more minutes late.  We strive to give you quality time with our providers, and arriving late affects you and other patients whose appointments are after yours.  Also, if you no show three or more times for appointments you may be dismissed from the clinic at the providers discretion.     Again, thank you for choosing [facility].  Our hope is that these requests will decrease the amount of time that you wait before being seen by our physicians.       _____________________________________________________________  Should you have questions after your visit to [facility], please contact our office at [phone] between the hours of 8:30 a.m. and 4:30 p.m.  Voicemails left after 4:30 p.m. will not be returned until the following business day.  For prescription refill requests, have your pharmacy contact our office.       Resources For Cancer Patients and their Caregivers ? American Cancer Society: Can assist with transportation, wigs, general needs, runs Look Good Feel Better.        3088441930 ? Cancer Care: Provides financial assistance, online support groups, medication/co-pay assistance.  1-800-813-HOPE (505)350-2976) ? [place] Assists [place] cancer  patients and their families through emotional , educational and financial support.  415-506-7499 ? [place] DSS Where to apply for food stamps, Medicaid and utility assistance. (575)094-2285 ? RCATS: Transportation to medical appointments. 6283074851 ? Social Security Administration: May apply for disability if have a Stage IV cancer. 6628295226 630-525-1716 ? [employer], Disability and Transit Services: Assists with nutrition, care and transit needs. [place] Support Programs: @10RELATIVEDAYS @ > Cancer Support Group  2nd Tuesday of the month 1pm-2pm, Journey Room  > Creative Journey  3rd Tuesday of the month 1130am-1pm, Journey Room  > Look Good Feel Better  1st Wednesday of the month 10am-12 noon, Journey Room (Call [place] to register [phone])

## 2016-12-06 ENCOUNTER — Ambulatory Visit (HOSPITAL_COMMUNITY): Payer: BLUE CROSS/BLUE SHIELD | Admitting: Hematology & Oncology

## 2016-12-06 ENCOUNTER — Encounter (HOSPITAL_BASED_OUTPATIENT_CLINIC_OR_DEPARTMENT_OTHER): Payer: BLUE CROSS/BLUE SHIELD | Admitting: Hematology & Oncology

## 2016-12-06 ENCOUNTER — Other Ambulatory Visit (HOSPITAL_COMMUNITY): Payer: BLUE CROSS/BLUE SHIELD

## 2016-12-06 ENCOUNTER — Encounter (HOSPITAL_COMMUNITY): Payer: Self-pay | Admitting: Hematology & Oncology

## 2016-12-06 ENCOUNTER — Encounter (HOSPITAL_COMMUNITY): Payer: BLUE CROSS/BLUE SHIELD

## 2016-12-06 VITALS — BP 126/83 | HR 97 | Temp 97.6°F | Resp 16 | Wt 117.0 lb

## 2016-12-06 DIAGNOSIS — C8338 Diffuse large B-cell lymphoma, lymph nodes of multiple sites: Secondary | ICD-10-CM | POA: Diagnosis not present

## 2016-12-06 DIAGNOSIS — J90 Pleural effusion, not elsewhere classified: Secondary | ICD-10-CM

## 2016-12-06 DIAGNOSIS — Z8619 Personal history of other infectious and parasitic diseases: Secondary | ICD-10-CM

## 2016-12-06 DIAGNOSIS — F418 Other specified anxiety disorders: Secondary | ICD-10-CM

## 2016-12-06 DIAGNOSIS — N6310 Unspecified lump in the right breast, unspecified quadrant: Secondary | ICD-10-CM | POA: Diagnosis not present

## 2016-12-06 DIAGNOSIS — F411 Generalized anxiety disorder: Secondary | ICD-10-CM | POA: Diagnosis not present

## 2016-12-06 DIAGNOSIS — T451X5A Adverse effect of antineoplastic and immunosuppressive drugs, initial encounter: Secondary | ICD-10-CM

## 2016-12-06 DIAGNOSIS — R4589 Other symptoms and signs involving emotional state: Secondary | ICD-10-CM

## 2016-12-06 DIAGNOSIS — C8330 Diffuse large B-cell lymphoma, unspecified site: Secondary | ICD-10-CM | POA: Diagnosis not present

## 2016-12-06 DIAGNOSIS — D701 Agranulocytosis secondary to cancer chemotherapy: Secondary | ICD-10-CM

## 2016-12-06 LAB — CBC WITH DIFFERENTIAL/PLATELET
Basophils Absolute: 0 10*3/uL (ref 0.0–0.1)
Basophils Relative: 1 %
EOS ABS: 0.2 10*3/uL (ref 0.0–0.7)
Eosinophils Relative: 22 %
HCT: 38.2 % (ref 36.0–46.0)
Hemoglobin: 12.8 g/dL (ref 12.0–15.0)
LYMPHS ABS: 0.5 10*3/uL — AB (ref 0.7–4.0)
Lymphocytes Relative: 55 %
MCH: 27.9 pg (ref 26.0–34.0)
MCHC: 33.5 g/dL (ref 30.0–36.0)
MCV: 83.4 fL (ref 78.0–100.0)
MONO ABS: 0.1 10*3/uL (ref 0.1–1.0)
MONOS PCT: 9 %
Neutro Abs: 0.1 10*3/uL — ABNORMAL LOW (ref 1.7–7.7)
Neutrophils Relative %: 13 %
PLATELETS: 127 10*3/uL — AB (ref 150–400)
RBC: 4.58 MIL/uL (ref 3.87–5.11)
RDW: 13.7 % (ref 11.5–15.5)
WBC: 0.9 10*3/uL — AB (ref 4.0–10.5)

## 2016-12-06 LAB — COMPREHENSIVE METABOLIC PANEL
ALBUMIN: 4.3 g/dL (ref 3.5–5.0)
ALT: 15 U/L (ref 14–54)
AST: 19 U/L (ref 15–41)
Alkaline Phosphatase: 55 U/L (ref 38–126)
Anion gap: 10 (ref 5–15)
BUN: 16 mg/dL (ref 6–20)
CHLORIDE: 99 mmol/L — AB (ref 101–111)
CO2: 26 mmol/L (ref 22–32)
CREATININE: 0.48 mg/dL (ref 0.44–1.00)
Calcium: 9.8 mg/dL (ref 8.9–10.3)
GFR calc Af Amer: >60 mL/min (ref >60)
GLUCOSE: 101 mg/dL — AB (ref 65–99)
POTASSIUM: 3.5 mmol/L (ref 3.5–5.1)
SODIUM: 135 mmol/L (ref 135–145)
Total Bilirubin: 0.6 mg/dL (ref 0.3–1.2)
Total Protein: 7.8 g/dL (ref 6.5–8.1)

## 2016-12-06 LAB — PHOSPHORUS: Phosphorus: 3.9 mg/dL (ref 2.5–4.6)

## 2016-12-06 LAB — URIC ACID: URIC ACID, SERUM: 1.8 mg/dL — AB (ref 2.3–6.6)

## 2016-12-06 MED ORDER — OXYCODONE HCL 5 MG PO TABS: 5.0000 mg | ORAL_TABLET | ORAL | 0 refills | Status: DC | PRN

## 2016-12-06 MED ORDER — HYDROCODONE-ACETAMINOPHEN 2.5-325 MG PO TABS: ORAL_TABLET | ORAL | 0 refills | Status: DC

## 2016-12-06 MED ORDER — ESCITALOPRAM OXALATE 20 MG PO TABS: ORAL_TABLET | ORAL | 3 refills | Status: DC

## 2016-12-06 NOTE — Progress Notes (Signed)
CRITICAL VALUE ALERT Critical value received:  WBC 0.9  Date of notification:  12/06/16 Time of notification: [id number] Critical value read back:  Yes.   Nurse who received alert:  T.Emmelyn Schmale,RN MD notified (1st page):  [phone]

## 2016-12-06 NOTE — Patient Instructions (Signed)
[place] at [facility] Discharge Instructions  RECOMMENDATIONS MADE BY THE CONSULTANT AND ANY TEST RESULTS WILL BE SENT TO YOUR REFERRING PHYSICIAN.  You were seen today by Dr. Whitney Muse. Rx for lexapro and pain medication given today. Return with next chemo for labs and follow up.   Thank you for choosing [place] at [facility] to provide your oncology and hematology care.  To afford each patient quality time with our provider, please arrive at least 15 minutes before your scheduled appointment time.    If you have a lab appointment with the [facility] [facility] please come in thru the  Main Entrance and check in at the main information desk  You need to re-schedule your appointment should you arrive 10 or more minutes late.  We strive to give you quality time with our providers, and arriving late affects you and other patients whose appointments are after yours.  Also, if you no show three or more times for appointments you may be dismissed from the clinic at the providers discretion.     Again, thank you for choosing [facility].  Our hope is that these requests will decrease the amount of time that you wait before being seen by our physicians.       _____________________________________________________________  Should you have questions after your visit to [facility], please contact our office at [phone] between the hours of 8:30 a.m. and 4:30 p.m.  Voicemails left after 4:30 p.m. will not be returned until the following business day.  For prescription refill requests, have your pharmacy contact our office.       Resources For Cancer Patients and their Caregivers ? American Cancer Society: Can assist with transportation, wigs, general needs, runs Look Good Feel Better.        (850) 549-8572 ? Cancer Care: Provides financial assistance, online support groups, medication/co-pay assistance.  1-800-813-HOPE  2722159185) ? [place] Assists [place] cancer patients and their families through emotional , educational and financial support.  5200365451 ? [place] DSS Where to apply for food stamps, Medicaid and utility assistance. 343-802-0345 ? [employer]ATS: Transportation to medical appointments. (214) 408-5995 ? Social Security Administration: May apply for disability if have a Stage IV cancer. 510 164 2155 [phone] ? [employer], Disability and Transit Services: Assists with nutrition, care and transit needs. [place] Support Programs: @10RELATIVEDAYS @ > Cancer Support Group  2nd Tuesday of the month 1pm-2pm, Journey Room  > Creative Journey  3rd Tuesday of the month 1130am-1pm, Journey Room  > Look Good Feel Better  1st Wednesday of the month 10am-12 noon, Journey Room (Call [place] to register [phone])

## 2016-12-07 ENCOUNTER — Telehealth (HOSPITAL_COMMUNITY): Payer: Self-pay | Admitting: Emergency Medicine

## 2016-12-07 MED ORDER — FIRST-DUKES MOUTHWASH MT SUSP: OROMUCOSAL | 2 refills | Status: DC

## 2016-12-07 NOTE — Telephone Encounter (Signed)
Pt called and stated that she had white sores in her mouth.  Dr Whitney Muse notified and dukes mouth wash called in.

## 2016-12-11 ENCOUNTER — Encounter (HOSPITAL_COMMUNITY): Payer: Self-pay

## 2016-12-11 ENCOUNTER — Telehealth (HOSPITAL_COMMUNITY): Payer: Self-pay | Admitting: Emergency Medicine

## 2016-12-11 NOTE — Telephone Encounter (Signed)
Pt wanted to know the rest of her chemotherapy schedule.

## 2016-12-19 ENCOUNTER — Encounter (HOSPITAL_COMMUNITY): Payer: Self-pay | Admitting: Hematology & Oncology

## 2016-12-19 NOTE — Progress Notes (Signed)
Monica Gray NOTE  Patient Care Team: Monica Blitz, MD as PCP - General (Internal Medicine)  CHIEF COMPLAINTS/PURPOSE OF CONSULTATION:     Diffuse large B cell lymphoma (Monica Gray)   09/25/2016 Imaging    CT neck- 1. Large right level 5A a nodal mass measuring up to 2.9 cm. This is suggestive of metastatic disease from an unknown primary. Histologic sampling is recommended. 2. Pleural masses of the medial posterior right chest and anterior lateral left chest. The larger mass, adjacent to the right aspect of the T3 and T4 vertebral bodies, extends into the T2-T3 and T3-T4 neural foramina without definite extension into the spinal canal. These masses are also favored to be metastases. A primary pleural malignancy such as mesothelioma is also a consideration.      10/03/2016 Procedure    Needle core biopsy of right cervical lymph node by IR.      10/04/2016 Pathology Results    Interpretation Tissue-Flow Cytometry - INSUFFICIENT CELLS FOR ANALYSIS.      10/08/2016 Pathology Results    Diagnosis Lymph node, needle/core biopsy, right cervical - ATYPICAL LYMPHOID PROLIFERATION, SUSPICIOUS FOR NON-HODGKIN B-CELL LYMPHOMA.      10/31/2016 Procedure    Excisional lymph node biopsy by Dr. Dalbert Gray      11/02/2016 Pathology Results    Interpretation Tissue-Flow Cytometry - B-CELL POPULATION WITH KAPPA LIGHT CHAIN EXCESS.      11/02/2016 Pathology Results    Lymph node, biopsy, Deep Cervical - FOLLICULAR AND DIFFUSE LARGE B-CELL LYMPHOMA.      11/09/2016 PET scan    1. Active lymphoma within the neck, chest, abdomen, and pelvis, as detailed above. 2. Medial right breast hypermetabolic nodule is suspicious for a synchronous breast primary. Differential considerations include breast lymphoma or a hypermetabolic benign lesion. Consider diagnostic right-sided mammogram and ultrasound. If the patient is diagnosed with right breast cancer, hypermetabolic  small right axillary node would be indeterminate for lymphoma versus metastasis. 3. Right larger than left pleural effusions. 4.  Aortic atherosclerosis. 5. Hypermetabolic liver lesion is favored to represent lymphoma.      11/09/2016 Imaging    CT CAP- 1. Extensive adenopathy in the chest, abdomen and pelvis, bilateral pleural nodularity/masses, omental nodularity and renal lesions, most consistent with lymphoma. 2. Bilateral pleural effusions, large on the right and moderate on the left. 3. Lucent lesion in the L1 vertebral body, indeterminate but well-circumscribed and possibly benign. Attention on followup exams is warranted. 4. Aortic atherosclerosis (ICD10-170.0). Coronary artery calcification. 5. Right adrenal adenoma      11/14/2016 Procedure    Bone marrow aspiration and biopsy by IR      11/16/2016 Pathology Results    Bone Marrow, Aspirate,Biopsy, and Clot - SLIGHTLY HYPERCELLULAR BONE MARROW FOR AGE WITH TRILINEAGE HEMATOPOIESIS. - SEVERAL LYMPHOID AGGREGATES PRESENT. - SEE COMMENT PERIPHERAL BLOOD: - NO SIGNIFICANT MORPHOLOGIC ABNORMALITIES. Comment: Lower grade lymphoproliferative process cannot be entirely excluded especially given the paratrabecular location of some of the lymphoid aggregates, the overall findings are very limited and not considered disgnostic. There is no marrow involvement by large cell lymphoma.      11/16/2016 Procedure    BREAST BIOPSY      11/16/2016 Pathology Results    Breast, right, needle core biopsy, 2:30 o'clock, 3 cm from nipple - NON HODGKIN'S B CELL LYMPHOMA. - SEE ONCOLOGY TABLE. 2. Lymph node, needle/core biopsy, right axilla - NON HODGKIN'S B CELL LYMPHOMA. - SEE ONCOLOGY TABLE. Microscopic Comment 1. LYMPHOMA  11/20/2016 Echocardiogram    Left ventricle: The cavity size was normal. Wall thickness was normal. Systolic function was normal. The estimated ejection fraction was in the range of 60% to 65%.  Doppler parameters are consistent with abnormal left ventricular relaxation (grade 1 diastolic dysfunction). Doppler parameters are consistent with high ventricular filling pressure.      11/20/2016 Procedure    Successful ultrasound guided right thoracentesis yielding 800 mL of pleural fluid.      11/20/2016 Pathology Results    PLEURAL FLUID, RIGHT - REACTIVE MESOTHELIAL CELLS AND SMALL LYMPHOID CELLS PRESENT. - SEE COMMENT. Monica Greenhouse MD Pathologist, Electronic Signature (Case signed 11/22/2016)       HISTORY OF PRESENTING ILLNESS:  Monica Gray 66 y.o. female is here for ongoing follow-up of DLBCL arising from follicular lymphoma. She has been treated with her first cycle of RCHOP. She has done suprisingly well. She initially had a lot of anxiety. She is doing much better.  She notes some fatigue. Minimal nausea. No vomiting. No diarrhea or constipation. Breathing is ok. She felt significant relief after her thoracentesis. She wishes to review her BMBX, thoracentesis pathology.  She is sleeping fairly well.  Weight is down 5 pounds and she does admit that eating is a challenge. Her husband tries to encourage her to eat often. Food does not taste good.  MEDICAL HISTORY:  Past Medical History:  Diagnosis Date  . Allergic to cats   . Anxiety   . Arthritis    hands  . Diffuse large B cell lymphoma ([place]) 10/31/2016  . Dyspnea    with exertion - intermittent, per pt.; no O2  . History of asthma    as a child  . History of hepatitis C    states was cured in 2016 after Harvoni  . Immature cataract   . Sensitive skin   . Wears dentures    upper; lower denture attaches to 4 dental implants, per pt.    SURGICAL HISTORY: Past Surgical History:  Procedure Laterality Date  . BONE MARROW ASPIRATION Right 11/14/2016   iliac  . BREAST BIOPSY Right   . CHEST TUBE INSERTION     1987, 1988  . LAPAROSCOPIC UNILATERAL SALPINGO OOPHERECTOMY     ectopic pregnancy  .  LYMPH NODE BIOPSY Right 10/31/2016   Procedure: EXCISION DEEP RIGHT CERVICAL LYMPH NODES;  Surgeon: Monica Skates, MD;  Location: [facility];  Service: General;  Laterality: Right;  . PORTACATH PLACEMENT Left 11/21/2016   Procedure: INSERTION PORT-A-CATH WITH [place];  Surgeon: Monica Skates, MD;  Location: [facility];  Service: General;  Laterality: Left;    SOCIAL HISTORY: Social History   Social History  . Marital status: Married    Spouse name: N/A  . Number of children: N/A  . Years of education: N/A   Occupational History  . Not on file.   Social History Main Topics  . Smoking status: Former Smoker    Quit date: 1984  . Smokeless tobacco: Never Used  . Alcohol use No  . Drug use: No  . Sexual activity: Not on file   Other Topics Concern  . Not on file   Social History Narrative  . No narrative on file   Married for 34 years 0 children Ex smoker, quit 33 years ago. Her lungs collapsed after she stopped smoking. ETOH, heavy drinker while living in [place]. She hasn't drank in 11 years. She was bit by a copperhead snake in 2005 and she was  found to have liver issues including Hepatitis C. She was treated for Hep C by Dr. Britta Mccreedy in [place]. She and her husband used to enjoy fishing and hiking Not currently working. 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  She was born in [place]: Family History  Problem Relation Age of Onset  . Leukemia Mother   . Emphysema Father    Mother died of leukemia, not sure what kind it was. Took chemotherapy. Father died of emphysema Brother, Francee Piccolo, died in his 71s Sister, still born Sister Brother, Shonna Chock, died of lung cancer in 03/03/1999 at 64 yo Nephew died at 60 yo of brain cancer  ALLERGIES:  is allergic to acetaminophen; codeine; dye fdc red [red dye]; shellfish allergy; sulfa antibiotics; zanaflex [tizanidine hcl]; and adhesive [tape].  MEDICATIONS:  Current  Outpatient Prescriptions  Medication Sig Dispense Refill  . allopurinol (ZYLOPRIM) 300 MG tablet Take 1 tablet (300 mg total) by mouth daily. 30 tablet 3  . ALPRAZolam (XANAX) 0.25 MG tablet Take 2 tablets as needed three times a day 60 tablet 0  . cyclophosphamide (CYTOXAN) 2 g chemo injection Inject into the vein once.    . Diphenhyd-Hydrocort-Nystatin (FIRST-DUKES MOUTHWASH) SUSP Diphenhydramine 12.5 mg/5 mL 1 part, Viscous Lidicaine 2% 1 part, Maalox 1 part, Swish and swallow 5 mL no more than every 4 hours 240 mL 2  . DOXOrubicin HCl (ADRIAMYCIN IV) Inject into the vein.    Marland Kitchen escitalopram (LEXAPRO) 20 MG tablet Take 1/2 tablet by mouth for 7 days then increase to one tablet daily thereafter 30 tablet 3  . Homeopathic Products (SIMILASAN DRY EYE RELIEF OP) Apply 1 drop to eye daily.    . Hydrocodone-Acetaminophen 2.5-325 MG TABS Take 1/2 to one tablet by mouth every 4 hours for pain 60 tablet 0  . ibuprofen (ADVIL,MOTRIN) 800 MG tablet Take 800 mg by mouth every 8 (eight) hours as needed for pain.  0  . lidocaine-prilocaine (EMLA) cream Apply to affected area once 30 g 3  . magnesium gluconate (MAGONATE) 500 MG tablet Take 500 mg by mouth every evening.     . Multiple Vitamins-Minerals (MULTIVITAMIN WITH MINERALS) tablet Take 1 tablet by mouth daily.    . NON FORMULARY daily. HOLY BASIL    . ondansetron (ZOFRAN) 8 MG tablet Take 1 tablet (8 mg total) by mouth every 8 (eight) hours as needed for refractory nausea / vomiting. 30 tablet 1  . oxyCODONE (OXY IR/ROXICODONE) 5 MG immediate release tablet Take 1 tablet (5 mg total) by mouth every 4 (four) hours as needed for severe pain. 60 tablet 0  . predniSONE (DELTASONE) 20 MG tablet Take 3 tablets (60 mg) on days 1-5 of chemotherapy 30 tablet 3  . Probiotic CAPS Take 1 capsule by mouth every evening.    . prochlorperazine (COMPAZINE) 10 MG tablet Take 1 tablet (10 mg total) by mouth every 6 (six) hours as needed (Nausea or vomiting). 30 tablet 6    . RiTUXimab (RITUXAN IV) Inject into the vein.    Marland Kitchen senna (SENOKOT) 8.6 MG TABS tablet Take 2 tablets by mouth daily.    . traMADol (ULTRAM) 50 MG tablet Take 1 tablet (50 mg total) by mouth 2 (two) times daily as needed. 30 tablet 0  . vinCRIStine 2 mg in sodium chloride 0.9 % 50 mL Inject 2 mg into the vein once.    . vitamin C (ASCORBIC ACID) 500 MG  tablet Take 500 mg by mouth daily.     No current facility-administered medications for this visit.     Review of Systems  Constitutional: Positive for diaphoresis (for the last month) and weight loss (about 12 lbs over the last month).  HENT: Negative.   Eyes: Negative.   Respiratory: Positive for shortness of breath.   Cardiovascular: Negative.   Gastrointestinal: Negative.  Negative for abdominal pain.  Genitourinary: Negative.   Musculoskeletal: Negative.        Right side / axillary pain  Skin: Negative.        Tenderness at right groin knot  Neurological: Negative.  Negative for headaches.  Endo/Heme/Allergies: Negative.   Psychiatric/Behavioral: The patient is nervous/anxious.   All other systems reviewed and are negative.  14 point ROS was done and is otherwise as detailed above or in HPI   PHYSICAL EXAMINATION: ECOG PERFORMANCE STATUS: 1 - Symptomatic but completely ambulatory  Vitals:   12/06/16 1254  BP: 126/83  Pulse: 97  Resp: 16  Temp: 97.6 F (36.4 C)   Filed Weights   12/06/16 1254  Weight: 117 lb (53.1 kg)    Physical Exam  Constitutional: She is oriented to person, place, and time and well-developed, well-nourished, and in no distress.  HENT:  Head: Normocephalic and atraumatic.  Mouth/Throat: Oropharynx is clear and moist. No oropharyngeal exudate.  Eyes: Conjunctivae and EOM are normal. Pupils are equal, round, and reactive to light. No scleral icterus.  Neck: Normal range of motion. Neck supple. No thyromegaly present.  Posterior R neck surgical incision site well healing  Cardiovascular:  Normal rate, regular rhythm and normal heart sounds.   Pulmonary/Chest: Effort normal and breath sounds normal.  Abdominal: Soft. Bowel sounds are normal. She exhibits no distension and no mass. There is no tenderness. There is no rebound and no guarding.  Musculoskeletal: Normal range of motion. She exhibits no edema.  Lymphadenopathy:    She has axillary adenopathy.       Right: Inguinal adenopathy present.  Neurological: She is alert and oriented to person, place, and time. No cranial nerve deficit. Gait normal.  Skin: Skin is warm and dry. No erythema.  Psychiatric: Affect and judgment normal.  Nursing note and vitals reviewed.   LABORATORY DATA:  I have reviewed the data as listed Lab Results  Component Value Date   WBC 0.9 (LL) 12/06/2016   HGB 12.8 12/06/2016   HCT 38.2 12/06/2016   MCV 83.4 12/06/2016   PLT 127 (L) 12/06/2016   CMP     Component Value Date/Time   NA 135 12/06/2016 1220   K 3.5 12/06/2016 1220   CL 99 (L) 12/06/2016 1220   CO2 26 12/06/2016 1220   GLUCOSE 101 (H) 12/06/2016 1220   BUN 16 12/06/2016 1220   CREATININE 0.48 12/06/2016 1220   CALCIUM 9.8 12/06/2016 1220   PROT 7.8 12/06/2016 1220   ALBUMIN 4.3 12/06/2016 1220   AST 19 12/06/2016 1220   ALT 15 12/06/2016 1220   ALKPHOS 55 12/06/2016 1220   BILITOT 0.6 12/06/2016 1220   GFRNONAA >60 12/06/2016 1220   GFRAA >60 12/06/2016 1220   PATHOLOGY         RADIOGRAPHIC STUDIES: I have personally reviewed the radiological images as listed and agreed with the findings in the report. No results found. CLINICAL DATA:  Initial treatment strategy for large B-cell lymphoma. Staging.  EXAM: NUCLEAR MEDICINE PET SKULL BASE TO THIGH  TECHNIQUE: 12.7 MCi F-18 FDG was injected intravenously.  Full-ring PET imaging was performed from the skull base to thigh after the radiotracer. CT data was obtained and used for attenuation correction and anatomic localization.  FASTING BLOOD GLUCOSE:   Value: 91 mg/dl  COMPARISON:  Chest and abdomen CTs of 10/30/2016.  FINDINGS: NECK  Left nasopharyngeal hypermetabolism and mild soft tissue fullness measures a S.U.V. max of 9.6 on image 11/series 4.  Bilateral posterior triangle hypermetabolic nodes. Index right-sided node measures 1.4 cm and a S.U.V. max of 15.4 on image 20/series 4. Bilateral low jugular/ supraclavicular nodes, including a left-sided node which measures 1.4 cm and a S.U.V. max of 9.0.  CHEST  Bilateral hypermetabolic pleural-based lesions. Example posteromedial cephalad right-sided lesion which measures 2.6 cm and a S.U.V. max of 10.6 on image 57/series 4.  Mediastinal hypermetabolic adenopathy with an index node measuring 2.6 cm and a S.U.V. max of 9.8 on image 72/series 4.  A medial right breast nodule measures 10 mm and a S.U.V. max of 4.6 on image 87/series 4. Right axillary node measures 8 mm and a S.U.V. max of 5.3 on image 67/series 4.  Right larger than left pleural effusions are similar. Centrilobular emphysema. Right lower lobe compressive atelectasis.  ABDOMEN/PELVIS  Subtle hypermetabolism within the lateral segment left liver lobe corresponding to hypoattenuation on CT. This measures 12 mm and a S.U.V. max of 5.0 on image 141/series 4. Extensive abdominopelvic hypermetabolic adenopathy. Example in the gastrohepatic ligament measuring 2.4 cm and a S.U.V. max of 8.2 on image 122/series 4. Retroperitoneal nodal conglomerate measures a S.U.V. max of 9.8, including on image 135/series 4.  Pelvic sidewall hypermetabolic adenopathy. Index right inguinal node measures 2.5 cm and a S.U.V. max of 10.8.  An omental hypermetabolic nodule measures a S.U.V. max of 7.3 on image 173/series 4. Other abdominal findings deferred to recent diagnostic CT. left renal lesion measures 11 mm and 61 HU on image 132/series 4. This is most consistent with a complex cyst, when compared to the prior  contrast-enhanced exam. Aortic atherosclerosis. No bowel obstruction or other acute complication. Hysterectomy. Appendicoliths.  SKELETON  No abnormal marrow activity. L1 lucent lesion is favored to represent a hemangioma and is eccentric left.  IMPRESSION: 1. Active lymphoma within the neck, chest, abdomen, and pelvis, as detailed above. 2. Medial right breast hypermetabolic nodule is suspicious for a synchronous breast primary. Differential considerations include breast lymphoma or a hypermetabolic benign lesion. Consider diagnostic right-sided mammogram and ultrasound. If the patient is diagnosed with right breast cancer, hypermetabolic small right axillary node would be indeterminate for lymphoma versus metastasis. 3. Right larger than left pleural effusions. 4.  Aortic atherosclerosis. 5. Hypermetabolic liver lesion is favored to represent lymphoma.   Electronically Signed   By: Abigail Miyamoto M.D.   On: 11/09/2016 14:46     ASSESSMENT & PLAN:  Diffuse large B-cell lymphoma, stage III, IPI score 3 (arising from follicular lymphoma) Bilateral Pleural Effusions, negative cytology Negative BMBX by histology and flow cytometry R breast mass, positive for lymphoma Anxiety about health Hx of spontaneous pneumothorax History of Hepatitis C, treated with harvoni   She has done well with cycle #1 of therapy. Labs were reviewed today. She is neutropenic. Neutropenic precautions were reviewed. Results are noted above.   She continues to have difficulties with eating, I am going to refer her to nutrition. She is encouraged to eat small frequent meals.  BMBX was reviewed with the patient. Final pathology from thoracentesis, BMBX was reviewed. Staging was reviewed.  Questions were answered. RTC  with next cycle.  All questions were answered. The patient knows to call the clinic with any problems, questions or concerns.  This document serves as a record of services  personally performed by Ancil Linsey, MD. It was created on her behalf by Arlyce Harman, a trained medical scribe. The creation of this record is based on the scribe's personal observations and the provider's statements to them. This document has been checked and approved by the attending provider.  I have reviewed the above documentation for accuracy and completeness and I agree with the above.  This note was electronically signed.   Molli Hazard, MD  12/19/2016 8:39 PM

## 2016-12-20 ENCOUNTER — Encounter (HOSPITAL_COMMUNITY): Payer: Self-pay | Admitting: Emergency Medicine

## 2016-12-20 ENCOUNTER — Encounter (HOSPITAL_COMMUNITY): Payer: BLUE CROSS/BLUE SHIELD | Attending: Hematology & Oncology

## 2016-12-20 ENCOUNTER — Encounter (HOSPITAL_BASED_OUTPATIENT_CLINIC_OR_DEPARTMENT_OTHER): Payer: BLUE CROSS/BLUE SHIELD | Admitting: Oncology

## 2016-12-20 ENCOUNTER — Encounter (HOSPITAL_COMMUNITY): Payer: Self-pay | Admitting: Oncology

## 2016-12-20 VITALS — BP 104/51 | HR 74 | Temp 98.0°F | Resp 16 | Wt 117.4 lb

## 2016-12-20 DIAGNOSIS — J90 Pleural effusion, not elsewhere classified: Secondary | ICD-10-CM | POA: Insufficient documentation

## 2016-12-20 DIAGNOSIS — Z5112 Encounter for antineoplastic immunotherapy: Secondary | ICD-10-CM

## 2016-12-20 DIAGNOSIS — F419 Anxiety disorder, unspecified: Secondary | ICD-10-CM | POA: Diagnosis not present

## 2016-12-20 DIAGNOSIS — K769 Liver disease, unspecified: Secondary | ICD-10-CM

## 2016-12-20 DIAGNOSIS — C8330 Diffuse large B-cell lymphoma, unspecified site: Secondary | ICD-10-CM | POA: Insufficient documentation

## 2016-12-20 DIAGNOSIS — Z87891 Personal history of nicotine dependence: Secondary | ICD-10-CM | POA: Insufficient documentation

## 2016-12-20 DIAGNOSIS — C8338 Diffuse large B-cell lymphoma, lymph nodes of multiple sites: Secondary | ICD-10-CM

## 2016-12-20 DIAGNOSIS — Z5111 Encounter for antineoplastic chemotherapy: Secondary | ICD-10-CM

## 2016-12-20 DIAGNOSIS — J45909 Unspecified asthma, uncomplicated: Secondary | ICD-10-CM | POA: Insufficient documentation

## 2016-12-20 DIAGNOSIS — B1920 Unspecified viral hepatitis C without hepatic coma: Secondary | ICD-10-CM | POA: Insufficient documentation

## 2016-12-20 LAB — COMPREHENSIVE METABOLIC PANEL
ALBUMIN: 4.2 g/dL (ref 3.5–5.0)
ALT: 14 U/L (ref 14–54)
AST: 21 U/L (ref 15–41)
Alkaline Phosphatase: 49 U/L (ref 38–126)
Anion gap: 5 (ref 5–15)
BILIRUBIN TOTAL: 0.4 mg/dL (ref 0.3–1.2)
BUN: 18 mg/dL (ref 6–20)
CHLORIDE: 102 mmol/L (ref 101–111)
CO2: 27 mmol/L (ref 22–32)
Calcium: 9.3 mg/dL (ref 8.9–10.3)
Creatinine, Ser: 0.56 mg/dL (ref 0.44–1.00)
GFR calc Af Amer: >60 mL/min (ref >60)
GFR calc non Af Amer: >60 mL/min (ref >60)
GLUCOSE: 119 mg/dL — AB (ref 65–99)
POTASSIUM: 3.8 mmol/L (ref 3.5–5.1)
Sodium: 134 mmol/L — ABNORMAL LOW (ref 135–145)
Total Protein: 7.3 g/dL (ref 6.5–8.1)

## 2016-12-20 LAB — URIC ACID: Uric Acid, Serum: 1.8 mg/dL — ABNORMAL LOW (ref 2.3–6.6)

## 2016-12-20 LAB — CBC WITH DIFFERENTIAL/PLATELET
BASOS ABS: 0.1 10*3/uL (ref 0.0–0.1)
BASOS PCT: 1 %
Eosinophils Absolute: 0 10*3/uL (ref 0.0–0.7)
Eosinophils Relative: 0 %
HEMATOCRIT: 33.5 % — AB (ref 36.0–46.0)
Hemoglobin: 11.7 g/dL — ABNORMAL LOW (ref 12.0–15.0)
Lymphocytes Relative: 6 %
Lymphs Abs: 0.4 10*3/uL — ABNORMAL LOW (ref 0.7–4.0)
MCH: 29.8 pg (ref 26.0–34.0)
MCHC: 34.9 g/dL (ref 30.0–36.0)
MCV: 85.5 fL (ref 78.0–100.0)
MONO ABS: 0.2 10*3/uL (ref 0.1–1.0)
Monocytes Relative: 2 %
NEUTROS ABS: 7.3 10*3/uL (ref 1.7–7.7)
NEUTROS PCT: 91 %
PLATELETS: 365 10*3/uL (ref 150–400)
RBC: 3.92 MIL/uL (ref 3.87–5.11)
RDW: 16.3 % — AB (ref 11.5–15.5)
WBC: 8 10*3/uL (ref 4.0–10.5)

## 2016-12-20 LAB — PHOSPHORUS: PHOSPHORUS: 3.8 mg/dL (ref 2.5–4.6)

## 2016-12-20 MED ORDER — PALONOSETRON HCL INJECTION 0.25 MG/5ML
0.2500 mg | Freq: Once | INTRAVENOUS | Status: AC
  Administered 2016-12-20: 0.25 mg via INTRAVENOUS

## 2016-12-20 MED ORDER — HEPARIN SOD (PORK) LOCK FLUSH 100 UNIT/ML IV SOLN
500.0000 [IU] | Freq: Once | INTRAVENOUS | Status: AC | PRN
  Administered 2016-12-20: 500 [IU]

## 2016-12-20 MED ORDER — PEGFILGRASTIM 6 MG/0.6ML ~~LOC~~ PSKT
6.0000 mg | PREFILLED_SYRINGE | Freq: Once | SUBCUTANEOUS | Status: AC
  Administered 2016-12-20: 6 mg via SUBCUTANEOUS

## 2016-12-20 MED ORDER — ACETAMINOPHEN 325 MG PO TABS: 650.0000 mg | ORAL_TABLET | Freq: Once | ORAL | Status: DC

## 2016-12-20 MED ORDER — DIPHENHYDRAMINE HCL 25 MG PO CAPS
ORAL_CAPSULE | ORAL | Status: AC
  Filled 2016-12-20: qty 2

## 2016-12-20 MED ORDER — DIPHENHYDRAMINE HCL 25 MG PO CAPS
50.0000 mg | ORAL_CAPSULE | Freq: Once | ORAL | Status: AC
Start: 2016-12-20 — End: 2016-12-20
  Administered 2016-12-20: 50 mg via ORAL

## 2016-12-20 MED ORDER — RITUXIMAB CHEMO INJECTION 500 MG/50ML
375.0000 mg/m2 | Freq: Once | INTRAVENOUS | Status: AC
  Administered 2016-12-20: 600 mg via INTRAVENOUS
  Filled 2016-12-20: qty 50

## 2016-12-20 MED ORDER — SODIUM CHLORIDE 0.9% FLUSH: 10.0000 mL | INTRAVENOUS | Status: DC | PRN

## 2016-12-20 MED ORDER — PEGFILGRASTIM 6 MG/0.6ML ~~LOC~~ PSKT
PREFILLED_SYRINGE | SUBCUTANEOUS | Status: AC
  Filled 2016-12-20: qty 0.6

## 2016-12-20 MED ORDER — SODIUM CHLORIDE 0.9 % IV SOLN
Freq: Once | INTRAVENOUS | Status: AC
  Administered 2016-12-20: 10:00:00 via INTRAVENOUS

## 2016-12-20 MED ORDER — PALONOSETRON HCL INJECTION 0.25 MG/5ML
INTRAVENOUS | Status: AC
  Filled 2016-12-20: qty 5

## 2016-12-20 MED ORDER — SODIUM CHLORIDE 0.9 % IV SOLN
750.0000 mg/m2 | Freq: Once | INTRAVENOUS | Status: AC
  Administered 2016-12-20: 1180 mg via INTRAVENOUS
  Filled 2016-12-20: qty 25

## 2016-12-20 MED ORDER — IBUPROFEN 200 MG PO TABS
400.0000 mg | ORAL_TABLET | Freq: Once | ORAL | Status: AC
  Administered 2016-12-20: 400 mg via ORAL
  Filled 2016-12-20: qty 1

## 2016-12-20 MED ORDER — DEXAMETHASONE SODIUM PHOSPHATE 10 MG/ML IJ SOLN
INTRAMUSCULAR | Status: AC
  Filled 2016-12-20: qty 1

## 2016-12-20 MED ORDER — SODIUM CHLORIDE 0.9 % IV SOLN: 10.0000 mg | Freq: Once | INTRAVENOUS | Status: DC

## 2016-12-20 MED ORDER — DOXORUBICIN HCL CHEMO IV INJECTION 2 MG/ML
50.0000 mg/m2 | Freq: Once | INTRAVENOUS | Status: AC
  Administered 2016-12-20: 78 mg via INTRAVENOUS
  Filled 2016-12-20: qty 39

## 2016-12-20 MED ORDER — DEXAMETHASONE SODIUM PHOSPHATE 10 MG/ML IJ SOLN
10.0000 mg | Freq: Once | INTRAMUSCULAR | Status: AC
  Administered 2016-12-20: 10 mg via INTRAVENOUS

## 2016-12-20 MED ORDER — IBUPROFEN 200 MG PO TABS
ORAL_TABLET | ORAL | Status: AC
  Filled 2016-12-20: qty 2

## 2016-12-20 MED ORDER — VINCRISTINE SULFATE CHEMO INJECTION 1 MG/ML
2.0000 mg | Freq: Once | INTRAVENOUS | Status: AC
  Administered 2016-12-20: 2 mg via INTRAVENOUS
  Filled 2016-12-20: qty 2

## 2016-12-20 NOTE — Patient Instructions (Signed)
Monica Gray at [facility] Discharge Instructions  RECOMMENDATIONS MADE BY THE CONSULTANT AND ANY TEST RESULTS WILL BE SENT TO YOUR REFERRING PHYSICIAN.  You were seen today by Kirby Crigler PA-C. PET scans after cycle #3. Return in 3 weeks for follow up.   Thank you for choosing [facility] at [facility] to provide your oncology and hematology care.  To afford each patient quality time with our provider, please arrive at least 15 minutes before your scheduled appointment time.    If you have a lab appointment with the [facility] please come in thru the  Main Entrance and check in at the main information desk  You need to re-schedule your appointment should you arrive 10 or more minutes late.  We strive to give you quality time with our providers, and arriving late affects you and other patients whose appointments are after yours.  Also, if you no show three or more times for appointments you may be dismissed from the clinic at the providers discretion.     Again, thank you for choosing [facility].  Our hope is that these requests will decrease the amount of time that you wait before being seen by our physicians.       _____________________________________________________________  Should you have questions after your visit to [facility], please contact our office at [phone] between the hours of 8:30 a.m. and 4:30 p.m.  Voicemails left after 4:30 p.m. will not be returned until the following business day.  For prescription refill requests, have your pharmacy contact our office.       Resources For Cancer Patients and their Caregivers ? American Cancer Society: Can assist with transportation, wigs, general needs, runs Look Good Feel Better.        ([phone] [phone] ? Cancer Care: Provides financial assistance, online support groups, medication/co-pay assistance.  1-800-813-HOPE (906)042-9865) ? [facility] Assists [place] cancer patients and their families through emotional , educational and financial support.  540-019-6006 ? [place] DSS Where to apply for food stamps, Medicaid and utility assistance. 620-294-1861 ? [facility]: Transportation to medical appointments. 4384025856 ? Social Security Administration: May apply for disability if have a Stage IV cancer. 267-852-8613 (726)392-5491 ? [employer], Disability and Transit Services: Assists with nutrition, care and transit needs. [facility] Support Programs: @10RELATIVEDAYS @ > Cancer Support Group  2nd Tuesday of the month 1pm-2pm, Journey Room  > Creative Journey  3rd Tuesday of the month 1130am-1pm, Journey Room  > Look Good Feel Better  1st Wednesday of the month 10am-12 noon, Journey Room (Call [facility] to register [phone])

## 2016-12-20 NOTE — Progress Notes (Signed)
Took pt to the journey room so she could pick out a wig.

## 2016-12-20 NOTE — Assessment & Plan Note (Addendum)
Stage IV DLBCL arising from follicular lymphoma with breast lesion biopsy proven to be DLBCL, beginning R-CHOP with curative intent on 11/29/2016.  Oncology history is updated.   Staging in [facility] problem list is completed.  She has presumed Stage IV disease with hypermetabolic hepatic lesion (not biopsy proven).  Pre-treatment labs today: CBC diff, CMET, uric acid, phosphorus.  I personally reviewed and went over laboratory results with the patient.  The results are noted within this dictation.  Labs satisfy treatment parameters.  Order is placed for restaging PET scan following cycle #3 to evaluate response to treatment.  Nasophayngeal soft tissue hypermetabolic activity is noted on initial staging PET scan.  If improved on restaging PET, we will need to consider prophylactic CNS treatment with MTX.  I have discussed the case with Dr. Abigail Miyamoto regarding this left nasopharyngeal hypermetabolic area.  He is pretty convinced that this is malignancy (lymphoma versus primary head and neck).  He reports that an MRI will not provide much additional information.  Also, due to hypermetabolic liver lesion, if this improves on restaging PET, then is favors that she has Stage IV disease at time of diagnosis.  Assuming she will need intrathecal chemotherapy in the future, I discussed this with her today.  She wants to wait until after her PET scan to embark on this aspect of her treatment, since I cannot prove to her that this is lymphomatous involvement, although very suspicious radiographically.  Again, if left nasopharyngeal activity improves/resolves, then we must assume that this was lymphoma and she will need prophylactic intrathecal chemotherapy. If it persists, then ENT referral would be most appropriate.   Patient is provided education regarding intrathecal chemotherapy.  She is advised of the procedure and side effects.  I kept this conversation very elementary as to not confuse her or increase her  anxiety.  Return in 3 weeks for follow-up and to embark on cycle #3 of treatment.  She seems confident in her decision moving forward.  More than 50% of the time spent with the patient was utilized for counseling and coordination of care.

## 2016-12-20 NOTE — Progress Notes (Signed)
Patient tolerated infusion well.  VSS.  Patient and husband educated on the Neulasta OnPro when applied as well as given troubleshooting card.  They both verbalized understanding of how it worked and when it would work as well as when and how to remove it.  Applied to left upper arm and flashing green.  Patient ambulatory and stable upon discharge from the clinic.

## 2016-12-20 NOTE — Patient Instructions (Addendum)
[facility] Discharge Instructions for Patients Receiving Chemotherapy   Beginning January 23rd 2017 lab work for the [facility] will be done in the  Main lab at [facility] on 1st floor. If you have a lab appointment with the [facility] please come in thru the  Main Entrance and check in at the main information desk   Today you received the following chemotherapy agents: Rituxan, Vincristine, Adriamycin, and cytoxan.   Neulasta applied as well that will infuse tomorrow.     If you develop nausea and vomiting, or diarrhea that is not controlled by your medication, call the clinic.  The clinic phone number is [phone]. Office hours are Monday-Friday 8:30am-5:00pm.  BELOW ARE SYMPTOMS THAT SHOULD BE REPORTED IMMEDIATELY:  *FEVER GREATER THAN 101.0 F  *CHILLS WITH OR WITHOUT FEVER  NAUSEA AND VOMITING THAT IS NOT CONTROLLED WITH YOUR NAUSEA MEDICATION  *UNUSUAL SHORTNESS OF BREATH  *UNUSUAL BRUISING OR BLEEDING  TENDERNESS IN MOUTH AND THROAT WITH OR WITHOUT PRESENCE OF ULCERS  *URINARY PROBLEMS  *BOWEL PROBLEMS  UNUSUAL RASH Items with * indicate a potential emergency and should be followed up as soon as possible. If you have an emergency after office hours please contact your primary care physician or go to the nearest emergency department.  Please call the clinic during office hours if you have any questions or concerns.   You may also contact the Patient Navigator at [phone] should you have any questions or need assistance in obtaining follow up care.      Resources For Cancer Patients and their Caregivers ? American Cancer Society: Can assist with transportation, wigs, general needs, runs Look Good Feel Better.        (775) 680-8723 ? Cancer Care: Provides financial assistance, online support groups, medication/co-pay assistance.  1-800-813-HOPE 9016227388) ? [facility] Assists [place] cancer patients and  their families through emotional , educational and financial support.  563 431 8726 ? [place] DSS Where to apply for food stamps, Medicaid and utility assistance. 623-241-0197 ? [facility]: Transportation to medical appointments. 843-250-9873 ? Social Security Administration: May apply for disability if have a Stage IV cancer. 808-515-0969 616-190-2194 ? [employer], Disability and Transit Services: Assists with nutrition, care and transit needs. 260-788-1586

## 2016-12-20 NOTE — Progress Notes (Signed)
[facility], MD [address] [place] [address]  Diffuse large B-cell lymphoma of lymph nodes of multiple regions [facility]) - Plan: NM PET Image Restag (PS) Skull Base To Thigh, CBC with Differential, Comprehensive metabolic panel, Uric acid, Phosphorus  CURRENT THERAPY: R-CHOP beginning on 11/29/2016.  INTERVAL HISTORY: Monica Gray 66 y.o. 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  Of note, breast lesion biopsy proven to be DLBCL.  Beginning R-CHOP with curative intent on 11/29/2016.  NEGATIVE bone marrow aspiration and biopsy for involvement of DLBCL.    Diffuse large B cell lymphoma ([place])   09/25/2016 Imaging    CT neck- 1. Large right level 5A a nodal mass measuring up to 2.9 cm. This is suggestive of metastatic disease from an unknown primary. Histologic sampling is recommended. 2. Pleural masses of the medial posterior right chest and anterior lateral left chest. The larger mass, adjacent to the right aspect of the T3 and T4 vertebral bodies, extends into the T2-T3 and T3-T4 neural foramina without definite extension into the spinal canal. These masses are also favored to be metastases. A primary pleural malignancy such as mesothelioma is also a consideration.      10/03/2016 Procedure    Needle core biopsy of right cervical lymph node by IR.      10/04/2016 Pathology Results    Interpretation Tissue-Flow Cytometry - INSUFFICIENT CELLS FOR ANALYSIS.      10/08/2016 Pathology Results    Diagnosis Lymph node, needle/core biopsy, right cervical - ATYPICAL LYMPHOID PROLIFERATION, SUSPICIOUS FOR NON-HODGKIN B-CELL LYMPHOMA.      10/31/2016 Procedure    Excisional lymph node biopsy by Dr. Dalbert Batman      11/02/2016 Pathology Results    Interpretation Tissue-Flow Cytometry - B-CELL POPULATION WITH KAPPA LIGHT CHAIN EXCESS.      11/02/2016 Pathology  Results    Lymph node, biopsy, Deep Cervical - FOLLICULAR AND DIFFUSE LARGE B-CELL LYMPHOMA.      11/09/2016 PET scan    1. Active lymphoma within the neck, chest, abdomen, and pelvis, as detailed above. 2. Medial right breast hypermetabolic nodule is suspicious for a synchronous breast primary. Differential considerations include breast lymphoma or a hypermetabolic benign lesion. Consider diagnostic right-sided mammogram and ultrasound. If the patient is diagnosed with right breast cancer, hypermetabolic small right axillary node would be indeterminate for lymphoma versus metastasis. 3. Right larger than left pleural effusions. 4.  Aortic atherosclerosis. 5. Hypermetabolic liver lesion is favored to represent lymphoma.      11/09/2016 Imaging    CT CAP- 1. Extensive adenopathy in the chest, abdomen and pelvis, bilateral pleural nodularity/masses, omental nodularity and renal lesions, most consistent with lymphoma. 2. Bilateral pleural effusions, large on the right and moderate on the left. 3. Lucent lesion in the L1 vertebral body, indeterminate but well-circumscribed and possibly benign. Attention on followup exams is warranted. 4. Aortic atherosclerosis (ICD10-170.0). Coronary artery calcification. 5. Right adrenal adenoma      11/14/2016 Procedure    Bone marrow aspiration and biopsy by IR      11/16/2016 Pathology Results    Bone Marrow, Aspirate,Biopsy, and Clot - SLIGHTLY HYPERCELLULAR BONE MARROW FOR AGE WITH TRILINEAGE HEMATOPOIESIS. - SEVERAL LYMPHOID AGGREGATES PRESENT. - SEE COMMENT PERIPHERAL BLOOD: - NO SIGNIFICANT MORPHOLOGIC ABNORMALITIES. Comment: Lower grade lymphoproliferative process cannot be entirely excluded especially given the paratrabecular location of some of the lymphoid aggregates, the overall findings  are very limited and not considered disgnostic. There is no marrow involvement by large cell lymphoma.      11/16/2016 Procedure    BREAST  BIOPSY      11/16/2016 Pathology Results    Breast, right, needle core biopsy, 2:30 o'clock, 3 cm from nipple - NON HODGKIN'S B CELL LYMPHOMA. - SEE ONCOLOGY TABLE. 2. Lymph node, needle/core biopsy, right axilla - NON HODGKIN'S B CELL LYMPHOMA. - SEE ONCOLOGY TABLE. Microscopic Comment 1. LYMPHOMA      11/20/2016 Echocardiogram    Left ventricle: The cavity size was normal. Wall thickness was normal. Systolic function was normal. The estimated ejection fraction was in the range of 60% to 65%. Doppler parameters are consistent with abnormal left ventricular relaxation (grade 1 diastolic dysfunction). Doppler parameters are consistent with high ventricular filling pressure.      11/20/2016 Procedure    Successful ultrasound guided right thoracentesis yielding 800 mL of pleural fluid.      11/20/2016 Pathology Results    PLEURAL FLUID, RIGHT - REACTIVE MESOTHELIAL CELLS AND SMALL LYMPHOID CELLS PRESENT. - SEE COMMENT. [name] MD Pathologist, Electronic Signature (Case signed 11/22/2016)      11/29/2016 -  Chemotherapy    The patient had DOXOrubicin (ADRIAMYCIN) chemo injection 78 mg, 50 mg/m2 = 78 mg, Intravenous,  Once, 1 of 6 cycles  palonosetron (ALOXI) injection 0.25 mg, 0.25 mg, Intravenous,  Once, 1 of 6 cycles  pegfilgrastim (NEULASTA) injection 6 mg, 6 mg, Subcutaneous,  Once, 1 of 1 cycle  pegfilgrastim (NEULASTA ONPRO KIT) injection 6 mg, 6 mg, Subcutaneous, Once, 0 of 5 cycles  vinCRIStine (ONCOVIN) 2 mg in sodium chloride 0.9 % 50 mL chemo infusion, 2 mg, Intravenous,  Once, 1 of 6 cycles  riTUXimab (RITUXAN) 600 mg in sodium chloride 0.9 % 250 mL (1.9355 mg/mL) chemo infusion, 375 mg/m2 = 600 mg, Intravenous,  Once, 1 of 6 cycles  cyclophosphamide (CYTOXAN) 1,180 mg in sodium chloride 0.9 % 250 mL chemo infusion, 750 mg/m2 = 1,180 mg, Intravenous,  Once, 1 of 6 cycles  for chemotherapy treatment.         She tolerated her first treatment  well.  She notes a short history of mouth sores lasting for approximately 72 hours. She notes that dukes mouthwash was very helpful with this issue. She is provided education regarding dukes mouthwash.  I reviewed her PET scan with her. She Raynaud's results but there are a few things that I wanted to touch base with her about to make sure she was aware of the possibility of intrathecal chemotherapy in the near future.  After long discussion, she decided on a game plan moving forward regarding the role of intrathecal chemotherapy.  She denies a nausea or vomiting. She admits to anxiety, but notes that even this is improved now that she's got her first cycle of chemotherapy completed. She denies any issues with her bowels.  Clinically, the patient reports an improvement in her neck and axillary lymph nodes.  Review of Systems  Constitutional: Negative.  Negative for chills, fever, malaise/fatigue and weight loss.  HENT: Negative.   Eyes: Negative.   Respiratory: Negative.  Negative for cough and shortness of breath.   Cardiovascular: Negative.  Negative for chest pain and leg swelling.  Gastrointestinal: Negative.  Negative for constipation, diarrhea, nausea and vomiting.  Genitourinary: Negative.   Musculoskeletal: Negative.   Skin: Negative.  Negative for rash.  Neurological: Negative.  Negative for weakness.  Endo/Heme/Allergies: Negative.   Psychiatric/Behavioral: Negative.  Past Medical History:  Diagnosis Date  . Allergic to cats   . Anxiety   . Arthritis    hands  . Diffuse large B cell lymphoma ([place]) 10/31/2016  . Dyspnea    with exertion - intermittent, per pt.; no O2  . History of asthma    as a child  . History of hepatitis C    states was cured in 2016 after [facility]  . Immature cataract   . Sensitive skin   . Wears dentures    upper; lower denture attaches to 4 dental implants, per pt.    Past Surgical History:  Procedure Laterality Date  . BONE MARROW  ASPIRATION Right 11/14/2016   iliac  . BREAST BIOPSY Right   . CHEST TUBE INSERTION     1987, 1988  . LAPAROSCOPIC UNILATERAL SALPINGO OOPHERECTOMY     ectopic pregnancy  . LYMPH NODE BIOPSY Right 10/31/2016   Procedure: EXCISION DEEP RIGHT CERVICAL LYMPH NODES;  Surgeon: Fanny Skates, MD;  Location: [facility];  Service: General;  Laterality: Right;  . PORTACATH PLACEMENT Left 11/21/2016   Procedure: INSERTION PORT-A-CATH WITH [place];  Surgeon: Fanny Skates, MD;  Location: [facility];  Service: General;  Laterality: Left;    Family History  Problem Relation Age of Onset  . Leukemia Mother   . Emphysema Father     Social History   Social History  . Marital status: Married    Spouse name: N/A  . Number of children: N/A  . Years of education: N/A   Social History Main Topics  . Smoking status: Former Smoker    Quit date: 1984  . Smokeless tobacco: Never Used  . Alcohol use No  . Drug use: No  . Sexual activity: Not Asked   Other Topics Concern  . None   Social History Narrative  . None     PHYSICAL EXAMINATION  ECOG PERFORMANCE STATUS: 1 - Symptomatic but completely ambulatory  There were no vitals filed for this visit.  Vitals - 1 value per visit 07/25/7892  SYSTOLIC 810  DIASTOLIC 51  Pulse 74  Temperature 98  Respirations 16  Weight (lb) 117.4    GENERAL:alert, no distress, well nourished, well developed, anxious, comfortable, cooperative, smiling and accompanied by her husband, in chemo-bed SKIN: skin color, texture, turgor are normal, no rashes or significant lesions HEAD: Normocephalic, No masses, lesions, tenderness or abnormalities EYES: normal, EOMI, Conjunctiva are pink and non-injected EARS: External ears normal OROPHARYNX:lips, buccal mucosa, and tongue normal and mucous membranes are moist  NECK: supple, trachea midline LYMPH:  no palpable lymphadenopathy BREAST:not examined LUNGS: clear to auscultation  HEART: regular rate &  rhythm ABDOMEN:abdomen soft and normal bowel sounds BACK: Back symmetric, no curvature. EXTREMITIES:less then 2 second capillary refill, no joint deformities, effusion, or inflammation, no edema, no skin discoloration, no cyanosis  NEURO: alert & oriented x 3 with fluent speech, no focal motor/sensory deficits, gait normal   LABORATORY DATA: CBC    Component Value Date/Time   WBC 8.0 12/20/2016 0842   RBC 3.92 12/20/2016 0842   HGB 11.7 (L) 12/20/2016 0842   HCT 33.5 (L) 12/20/2016 0842   PLT 365 12/20/2016 0842   MCV 85.5 12/20/2016 0842   MCH 29.8 12/20/2016 0842   MCHC 34.9 12/20/2016 0842   RDW 16.3 (H) 12/20/2016 0842   LYMPHSABS 0.4 (L) 12/20/2016 0842   MONOABS 0.2 12/20/2016 0842   EOSABS 0.0 12/20/2016 0842   BASOSABS 0.1 12/20/2016 1751  Chemistry      Component Value Date/Time   NA 134 (L) 12/20/2016 0842   K 3.8 12/20/2016 0842   CL 102 12/20/2016 0842   CO2 27 12/20/2016 0842   BUN 18 12/20/2016 0842   CREATININE 0.56 12/20/2016 0842      Component Value Date/Time   CALCIUM 9.3 12/20/2016 0842   ALKPHOS 49 12/20/2016 0842   AST 21 12/20/2016 0842   ALT 14 12/20/2016 0842   BILITOT 0.4 12/20/2016 0842        PENDING LABS:   RADIOGRAPHIC STUDIES:  Dg Chest Port 1 View  Result Date: 11/21/2016 CLINICAL DATA:  66 year old female status post porta cath placement. Lymphoma. Recent right side thoracentesis. Initial encounter. EXAM: PORTABLE CHEST 1 VIEW COMPARISON:  [facility] 11/20/2016 chest radiograph and earlier FINDINGS: Portable AP semi upright view at 1352 hours. Left chest subclavian approach porta cath placed. Catheter tip projects at the cavoatrial junction. The port Has a CT designation indicating a power port. No pneumothorax. Moderate left pleural effusion persists. Residual small right pleural effusion is less apparent. Stable cardiac size and mediastinal contours. Interval increased pulmonary vascularity. IMPRESSION: 1. Left chest  subclavian approach power port placed with no adverse features. 2. Stable pleural effusions, now larger on the left. Increased pulmonary vascularity. Electronically Signed   By: Genevie Ann M.D.   On: 11/21/2016 14:01   Dg Fluoro Guide Cv Line-no Report  Result Date: 11/21/2016 There is no Radiologist interpretation  for this exam.    PATHOLOGY:    ASSESSMENT AND PLAN:  Diffuse large B cell lymphoma ([place]) Stage IV DLBCL arising from follicular lymphoma with breast lesion biopsy proven to be DLBCL, beginning R-CHOP with curative intent on 11/29/2016.  Oncology history is updated.   Staging in [facility] problem list is completed.  She has presumed Stage IV disease with hypermetabolic hepatic lesion (not biopsy proven).  Pre-treatment labs today: CBC diff, CMET, uric acid, phosphorus.  I personally reviewed and went over laboratory results with the patient.  The results are noted within this dictation.  Labs satisfy treatment parameters.  Order is placed for restaging PET scan following cycle #3 to evaluate response to treatment.  Nasophayngeal soft tissue hypermetabolic activity is noted on initial staging PET scan.  If improved on restaging PET, we will need to consider prophylactic CNS treatment with MTX.  I have discussed the case with Dr. Abigail Miyamoto regarding this left nasopharyngeal hypermetabolic area.  He is pretty convinced that this is malignancy (lymphoma versus primary head and neck).  He reports that an MRI will not provide much additional information.  Also, due to hypermetabolic liver lesion, if this improves on restaging PET, then is favors that she has Stage IV disease at time of diagnosis.  Assuming she will need intrathecal chemotherapy in the future, I discussed this with her today.  She wants to wait until after her PET scan to embark on this aspect of her treatment, since I cannot prove to her that this is lymphomatous involvement, although very suspicious radiographically.  Again, if  left nasopharyngeal activity improves/resolves, then we must assume that this was lymphoma and she will need prophylactic intrathecal chemotherapy. If it persists, then ENT referral would be most appropriate.   Patient is provided education regarding intrathecal chemotherapy.  She is advised of the procedure and side effects.  I kept this conversation very elementary as to not confuse her or increase her anxiety.  Return in 3 weeks for follow-up and to  embark on cycle #3 of treatment.  She seems confident in her decision moving forward.  More than 50% of the time spent with the patient was utilized for counseling and coordination of care.   ORDERS PLACED FOR THIS ENCOUNTER: Orders Placed This Encounter  Procedures  . NM PET Image Restag (PS) Skull Base To Thigh    MEDICATIONS PRESCRIBED THIS ENCOUNTER: Meds ordered this encounter  Medications  . BANOPHEN 12.5 MG/5ML liquid    Refill:  1    THERAPY PLAN:  Continue with chemotherapy as planned with curative intent.  Will restage with PET imaging following cycle #3 of treatment.  All questions were answered. The patient knows to call the clinic with any problems, questions or concerns. We can certainly see the patient much sooner if necessary.  Patient and plan discussed with Dr. Twana First and she is in agreement with the aforementioned.   This note is electronically signed by: Doy Mince 12/20/2016 7:12 PM

## 2016-12-21 LAB — CHROMOSOME ANALYSIS, BONE MARROW

## 2017-01-10 ENCOUNTER — Encounter (HOSPITAL_COMMUNITY): Payer: Self-pay | Admitting: Oncology

## 2017-01-10 ENCOUNTER — Ambulatory Visit (HOSPITAL_COMMUNITY): Payer: BLUE CROSS/BLUE SHIELD | Admitting: Hematology & Oncology

## 2017-01-10 ENCOUNTER — Encounter (HOSPITAL_BASED_OUTPATIENT_CLINIC_OR_DEPARTMENT_OTHER): Payer: BLUE CROSS/BLUE SHIELD

## 2017-01-10 ENCOUNTER — Encounter (HOSPITAL_BASED_OUTPATIENT_CLINIC_OR_DEPARTMENT_OTHER): Payer: BLUE CROSS/BLUE SHIELD | Admitting: Oncology

## 2017-01-10 VITALS — BP 96/55 | HR 72 | Temp 98.2°F | Resp 16

## 2017-01-10 VITALS — BP 115/75 | HR 83 | Temp 98.5°F | Resp 16 | Ht 64.0 in | Wt 118.0 lb

## 2017-01-10 DIAGNOSIS — C8338 Diffuse large B-cell lymphoma, lymph nodes of multiple sites: Secondary | ICD-10-CM

## 2017-01-10 DIAGNOSIS — Z5112 Encounter for antineoplastic immunotherapy: Secondary | ICD-10-CM | POA: Diagnosis not present

## 2017-01-10 DIAGNOSIS — Z5111 Encounter for antineoplastic chemotherapy: Secondary | ICD-10-CM

## 2017-01-10 DIAGNOSIS — C8330 Diffuse large B-cell lymphoma, unspecified site: Secondary | ICD-10-CM | POA: Diagnosis not present

## 2017-01-10 LAB — CBC WITH DIFFERENTIAL/PLATELET
BASOS ABS: 0.1 10*3/uL (ref 0.0–0.1)
BASOS PCT: 1 %
EOS PCT: 1 %
Eosinophils Absolute: 0 10*3/uL (ref 0.0–0.7)
HEMATOCRIT: 32 % — AB (ref 36.0–46.0)
Hemoglobin: 11.1 g/dL — ABNORMAL LOW (ref 12.0–15.0)
Lymphocytes Relative: 4 %
Lymphs Abs: 0.3 10*3/uL — ABNORMAL LOW (ref 0.7–4.0)
MCH: 30.5 pg (ref 26.0–34.0)
MCHC: 34.7 g/dL (ref 30.0–36.0)
MCV: 87.9 fL (ref 78.0–100.0)
Monocytes Absolute: 0.4 10*3/uL (ref 0.1–1.0)
Monocytes Relative: 6 %
NEUTROS ABS: 5.9 10*3/uL (ref 1.7–7.7)
Neutrophils Relative %: 88 %
PLATELETS: 335 10*3/uL (ref 150–400)
RBC: 3.64 MIL/uL — ABNORMAL LOW (ref 3.87–5.11)
RDW: 19.5 % — ABNORMAL HIGH (ref 11.5–15.5)
WBC: 6.6 10*3/uL (ref 4.0–10.5)

## 2017-01-10 LAB — PHOSPHORUS: Phosphorus: 3.4 mg/dL (ref 2.5–4.6)

## 2017-01-10 LAB — COMPREHENSIVE METABOLIC PANEL
ALBUMIN: 4.2 g/dL (ref 3.5–5.0)
ALT: 13 U/L — ABNORMAL LOW (ref 14–54)
AST: 23 U/L (ref 15–41)
Alkaline Phosphatase: 41 U/L (ref 38–126)
Anion gap: 8 (ref 5–15)
BUN: 17 mg/dL (ref 6–20)
CHLORIDE: 99 mmol/L — AB (ref 101–111)
CO2: 27 mmol/L (ref 22–32)
Calcium: 9.4 mg/dL (ref 8.9–10.3)
Creatinine, Ser: 0.53 mg/dL (ref 0.44–1.00)
GFR calc Af Amer: >60 mL/min (ref >60)
GFR calc non Af Amer: >60 mL/min (ref >60)
GLUCOSE: 113 mg/dL — AB (ref 65–99)
POTASSIUM: 4.1 mmol/L (ref 3.5–5.1)
Sodium: 134 mmol/L — ABNORMAL LOW (ref 135–145)
Total Bilirubin: 0.5 mg/dL (ref 0.3–1.2)
Total Protein: 7.1 g/dL (ref 6.5–8.1)

## 2017-01-10 LAB — URIC ACID: Uric Acid, Serum: 1.9 mg/dL — ABNORMAL LOW (ref 2.3–6.6)

## 2017-01-10 MED ORDER — SODIUM CHLORIDE 0.9 % IV SOLN
750.0000 mg/m2 | Freq: Once | INTRAVENOUS | Status: AC
  Administered 2017-01-10: 1180 mg via INTRAVENOUS
  Filled 2017-01-10: qty 50

## 2017-01-10 MED ORDER — SODIUM CHLORIDE 0.9 % IV SOLN
Freq: Once | INTRAVENOUS | Status: AC
  Administered 2017-01-10: 10:00:00 via INTRAVENOUS

## 2017-01-10 MED ORDER — SODIUM CHLORIDE 0.9 % IV SOLN
375.0000 mg/m2 | Freq: Once | INTRAVENOUS | Status: AC
  Administered 2017-01-10: 600 mg via INTRAVENOUS
  Filled 2017-01-10: qty 10

## 2017-01-10 MED ORDER — PEGFILGRASTIM 6 MG/0.6ML ~~LOC~~ PSKT
6.0000 mg | PREFILLED_SYRINGE | Freq: Once | SUBCUTANEOUS | Status: AC
  Administered 2017-01-10: 6 mg via SUBCUTANEOUS
  Filled 2017-01-10: qty 0.6

## 2017-01-10 MED ORDER — DOXORUBICIN HCL CHEMO IV INJECTION 2 MG/ML
50.0000 mg/m2 | Freq: Once | INTRAVENOUS | Status: AC
  Administered 2017-01-10: 78 mg via INTRAVENOUS
  Filled 2017-01-10: qty 39

## 2017-01-10 MED ORDER — DEXAMETHASONE SODIUM PHOSPHATE 100 MG/10ML IJ SOLN: 10.0000 mg | Freq: Once | INTRAMUSCULAR | Status: DC

## 2017-01-10 MED ORDER — HEPARIN SOD (PORK) LOCK FLUSH 100 UNIT/ML IV SOLN
500.0000 [IU] | Freq: Once | INTRAVENOUS | Status: AC | PRN
  Administered 2017-01-10: 500 [IU]
  Filled 2017-01-10: qty 5

## 2017-01-10 MED ORDER — PALONOSETRON HCL INJECTION 0.25 MG/5ML
0.2500 mg | Freq: Once | INTRAVENOUS | Status: AC
  Administered 2017-01-10: 0.25 mg via INTRAVENOUS
  Filled 2017-01-10: qty 5

## 2017-01-10 MED ORDER — DEXAMETHASONE SODIUM PHOSPHATE 10 MG/ML IJ SOLN
10.0000 mg | Freq: Once | INTRAMUSCULAR | Status: AC
  Administered 2017-01-10: 10 mg via INTRAVENOUS
  Filled 2017-01-10: qty 1

## 2017-01-10 MED ORDER — SODIUM CHLORIDE 0.9% FLUSH
10.0000 mL | INTRAVENOUS | Status: DC | PRN
Start: 2017-01-10 — End: 2017-01-10

## 2017-01-10 MED ORDER — VINCRISTINE SULFATE CHEMO INJECTION 1 MG/ML
2.0000 mg | Freq: Once | INTRAVENOUS | Status: AC
  Administered 2017-01-10: 2 mg via INTRAVENOUS
  Filled 2017-01-10: qty 2

## 2017-01-10 MED ORDER — ACETAMINOPHEN 325 MG PO TABS
650.0000 mg | ORAL_TABLET | Freq: Once | ORAL | Status: DC
  Filled 2017-01-10: qty 2

## 2017-01-10 MED ORDER — DIPHENHYDRAMINE HCL 25 MG PO CAPS
50.0000 mg | ORAL_CAPSULE | Freq: Once | ORAL | Status: DC
  Filled 2017-01-10: qty 2

## 2017-01-10 NOTE — Progress Notes (Signed)
[facility], MD [address] [place] [address]  Diffuse large B-cell lymphoma of lymph nodes of multiple regions [facility]) - Plan: Uric acid, Phosphorus  CURRENT THERAPY: R-CHOP beginning on 11/29/2016.  INTERVAL HISTORY: Monica Gray 66 y.o. 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  Of note, breast lesion biopsy proven to be DLBCL.  Beginning R-CHOP with curative intent on 11/29/2016.  NEGATIVE bone marrow aspiration and biopsy for involvement of DLBCL.    Diffuse large B cell lymphoma (Monica Gray)   09/25/2016 Imaging    CT neck- 1. Large right level 5A a nodal mass measuring up to 2.9 cm. This is suggestive of metastatic disease from an unknown primary. Histologic sampling is recommended. 2. Pleural masses of the medial posterior right chest and anterior lateral left chest. The larger mass, adjacent to the right aspect of the T3 and T4 vertebral bodies, extends into the T2-T3 and T3-T4 neural foramina without definite extension into the spinal canal. These masses are also favored to be metastases. A primary pleural malignancy such as mesothelioma is also a consideration.      10/03/2016 Procedure    Needle core biopsy of right cervical lymph node by IR.      10/04/2016 Pathology Results    Interpretation Tissue-Flow Cytometry - INSUFFICIENT CELLS FOR ANALYSIS.      10/08/2016 Pathology Results    Diagnosis Lymph node, needle/core biopsy, right cervical - ATYPICAL LYMPHOID PROLIFERATION, SUSPICIOUS FOR NON-HODGKIN B-CELL LYMPHOMA.      10/31/2016 Procedure    Excisional lymph node biopsy by Dr. Dalbert Gray      11/02/2016 Pathology Results    Interpretation Tissue-Flow Cytometry - B-CELL POPULATION WITH KAPPA LIGHT CHAIN EXCESS.      11/02/2016 Pathology Results    Lymph node, biopsy, Deep Cervical - FOLLICULAR AND DIFFUSE LARGE B-CELL LYMPHOMA.      11/09/2016 PET scan    1. Active lymphoma within the neck, chest, abdomen, and pelvis, as detailed above. 2. Medial right breast hypermetabolic nodule is suspicious for a synchronous breast primary. Differential considerations include breast lymphoma or a hypermetabolic benign lesion. Consider diagnostic right-sided mammogram and ultrasound. If the patient is diagnosed with right breast cancer, hypermetabolic small right axillary node would be indeterminate for lymphoma versus metastasis. 3. Right larger than left pleural effusions. 4.  Aortic atherosclerosis. 5. Hypermetabolic liver lesion is favored to represent lymphoma.      11/09/2016 Imaging    CT CAP- 1. Extensive adenopathy in the chest, abdomen and pelvis, bilateral pleural nodularity/masses, omental nodularity and renal lesions, most consistent with lymphoma. 2. Bilateral pleural effusions, large on the right and moderate on the left. 3. Lucent lesion in the L1 vertebral body, indeterminate but well-circumscribed and possibly benign. Attention on followup exams is warranted. 4. Aortic atherosclerosis (ICD10-170.0). Coronary artery calcification. 5. Right adrenal adenoma      11/14/2016 Procedure    Bone marrow aspiration and biopsy by IR      11/16/2016 Pathology Results    Bone Marrow, Aspirate,Biopsy, and Clot - SLIGHTLY HYPERCELLULAR BONE MARROW FOR AGE WITH TRILINEAGE HEMATOPOIESIS. - SEVERAL LYMPHOID AGGREGATES PRESENT. - SEE COMMENT PERIPHERAL BLOOD: - NO SIGNIFICANT MORPHOLOGIC ABNORMALITIES. Comment: Lower grade lymphoproliferative process cannot be entirely excluded especially given the paratrabecular location of some of the lymphoid aggregates, the overall findings are very limited and not considered disgnostic. There is no marrow involvement by large cell lymphoma.  11/16/2016 Procedure    BREAST BIOPSY      11/16/2016 Pathology Results    Breast, right, needle core biopsy, 2:30 o'clock, 3 cm  from nipple - NON HODGKIN'S B CELL LYMPHOMA. - SEE ONCOLOGY TABLE. 2. Lymph node, needle/core biopsy, right axilla - NON HODGKIN'S B CELL LYMPHOMA. - SEE ONCOLOGY TABLE. Microscopic Comment 1. LYMPHOMA      11/20/2016 Echocardiogram    Left ventricle: The cavity size was normal. Wall thickness was normal. Systolic function was normal. The estimated ejection fraction was in the range of 60% to 65%. Doppler parameters are consistent with abnormal left ventricular relaxation (grade 1 diastolic dysfunction). Doppler parameters are consistent with high ventricular filling pressure.      11/20/2016 Procedure    Successful ultrasound guided right thoracentesis yielding 800 mL of pleural fluid.      11/20/2016 Pathology Results    PLEURAL FLUID, RIGHT - REACTIVE MESOTHELIAL CELLS AND SMALL LYMPHOID CELLS PRESENT. - SEE COMMENT. Monica Greenhouse MD Pathologist, Electronic Signature (Case signed 11/22/2016)      11/29/2016 -  Chemotherapy    The patient had DOXOrubicin (ADRIAMYCIN) chemo injection 78 mg, 50 mg/m2 = 78 mg, Intravenous,  Once, 1 of 6 cycles  palonosetron (ALOXI) injection 0.25 mg, 0.25 mg, Intravenous,  Once, 1 of 6 cycles  pegfilgrastim (NEULASTA) injection 6 mg, 6 mg, Subcutaneous,  Once, 1 of 1 cycle  pegfilgrastim (NEULASTA ONPRO KIT) injection 6 mg, 6 mg, Subcutaneous, Once, 0 of 5 cycles  vinCRIStine (ONCOVIN) 2 mg in sodium chloride 0.9 % 50 mL chemo infusion, 2 mg, Intravenous,  Once, 1 of 6 cycles  riTUXimab (RITUXAN) 600 mg in sodium chloride 0.9 % 250 mL (1.9355 mg/mL) chemo infusion, 375 mg/m2 = 600 mg, Intravenous,  Once, 1 of 6 cycles  cyclophosphamide (CYTOXAN) 1,180 mg in sodium chloride 0.9 % 250 mL chemo infusion, 750 mg/m2 = 1,180 mg, Intravenous,  Once, 1 of 6 cycles  for chemotherapy treatment.          She continues to tolerate therapy well.  Cycle 2 went very well for her.  She notes that she consumed yogurt on a daily basis and this  helped prevent any mouth sores.  She denies any nausea or vomiting.  Appetite is fair and weight is stable.  Her last encounter, I broached the subject of intrathecal chemotherapy given the presumed left nasopharyngeal hypermetabolic activity has been lymphoma involvement.  This puts her at high risk for CNS disease.  She reports today that after thinking about this over the last 3 weeks, she is not interested in pursuing this prophylactic treatment option.  We have very long discussion regarding this and the risks associated with this decision.  After our discussion, she is reconsidering and we will make final medical oncology recommendations following her upcoming PET scan.  She is willing to at least try this intervention in the future if we deem it necessary.  She notes some hard stools.  She will be given a constipation sheet today.  She denies any blood in her stools.  She is concerned about the financial toxicity of treatment particularly the addition of intrathecal chemotherapy.  She notes that she has not received a bill for her first chemotherapy treatment at this point time and she is very concerned about how much this is augmented cost out of pocket.  She is insured.  Review of Systems  Constitutional: Negative.  Negative for chills, fever, malaise/fatigue and weight loss.  HENT: Negative.  Eyes: Negative.   Respiratory: Negative.  Negative for cough and shortness of breath.   Cardiovascular: Negative.  Negative for chest pain and leg swelling.  Gastrointestinal: Negative.  Negative for constipation, diarrhea, nausea and vomiting.  Genitourinary: Negative.   Musculoskeletal: Negative.   Skin: Negative.  Negative for rash.  Neurological: Negative.  Negative for weakness.  Endo/Heme/Allergies: Negative.   Psychiatric/Behavioral: Negative.     Past Medical History:  Diagnosis Date  . Allergic to cats   . Anxiety   . Arthritis    hands  . Diffuse large B cell lymphoma ([place])  10/31/2016  . Dyspnea    with exertion - intermittent, per pt.; no O2  . History of asthma    as a child  . History of hepatitis C    states was cured in 2016 after [facility]  . Immature cataract   . Sensitive skin   . Wears dentures    upper; lower denture attaches to 4 dental implants, per pt.    Past Surgical History:  Procedure Laterality Date  . BONE MARROW ASPIRATION Right 11/14/2016   iliac  . BREAST BIOPSY Right   . CHEST TUBE INSERTION     1987, 1988  . LAPAROSCOPIC UNILATERAL SALPINGO OOPHERECTOMY     ectopic pregnancy  . LYMPH NODE BIOPSY Right 10/31/2016   Procedure: EXCISION DEEP RIGHT CERVICAL LYMPH NODES;  Surgeon: Fanny Skates, MD;  Location: [place];  Service: General;  Laterality: Right;  . PORTACATH PLACEMENT Left 11/21/2016   Procedure: INSERTION PORT-A-CATH WITH [place];  Surgeon: Fanny Skates, MD;  Location: [place] [facility];  Service: General;  Laterality: Left;    Family History  Problem Relation Age of Onset  . Leukemia Mother   . Emphysema Father     Social History   Social History  . Marital status: Married    Spouse name: N/A  . Number of children: N/A  . Years of education: N/A   Social History Main Topics  . Smoking status: Former Smoker    Quit date: 1984  . Smokeless tobacco: Never Used  . Alcohol use No  . Drug use: No  . Sexual activity: Not Asked   Other Topics Concern  . None   Social History Narrative  . None     PHYSICAL EXAMINATION  ECOG PERFORMANCE STATUS: 1 - Symptomatic but completely ambulatory  Vitals:   01/10/17 0857  BP: 115/75  Pulse: 83  Resp: 16  Temp: 98.5 F (36.9 C)     GENERAL:alert, no distress, well nourished, well developed, anxious, comfortable, cooperative, smiling and accompanied by her husband, in exam room. SKIN: skin color, texture, turgor are normal, no rashes or significant lesions HEAD: Normocephalic, No masses, lesions, tenderness or abnormalities EYES: normal, EOMI,  Conjunctiva are pink and non-injected EARS: External ears normal OROPHARYNX:lips, buccal mucosa, and tongue normal and mucous membranes are moist  NECK: supple, trachea midline LYMPH:  no palpable lymphadenopathy.  Cervical adenopathy is not palpable. BREAST:not examined LUNGS: clear to auscultation without wheezes, rales, rhonchi. HEART: regular rate & rhythm without murmur, rub, gallop. ABDOMEN:abdomen soft and normal bowel sounds BACK: Back symmetric, no curvature. EXTREMITIES:less then 2 second capillary refill, no joint deformities, effusion, or inflammation, no edema, no skin discoloration, no cyanosis  NEURO: alert & oriented x 3 with fluent speech, no focal motor/sensory deficits, gait normal   LABORATORY DATA: CBC    Component Value Date/Time   WBC 6.6 01/10/2017 0838   RBC 3.64 (L) 01/10/2017 0601  HGB 11.1 (L) 01/10/2017 0838   HCT 32.0 (L) 01/10/2017 0838   PLT 335 01/10/2017 0838   MCV 87.9 01/10/2017 0838   MCH 30.5 01/10/2017 0838   MCHC 34.7 01/10/2017 0838   RDW 19.5 (H) 01/10/2017 0838   LYMPHSABS 0.3 (L) 01/10/2017 0838   MONOABS 0.4 01/10/2017 0838   EOSABS 0.0 01/10/2017 0838   BASOSABS 0.1 01/10/2017 0838      Chemistry      Component Value Date/Time   NA 134 (L) 01/10/2017 0838   K 4.1 01/10/2017 0838   CL 99 (L) 01/10/2017 0838   CO2 27 01/10/2017 0838   BUN 17 01/10/2017 0838   CREATININE 0.53 01/10/2017 0838      Component Value Date/Time   CALCIUM 9.4 01/10/2017 0838   ALKPHOS 41 01/10/2017 0838   AST 23 01/10/2017 0838   ALT 13 (L) 01/10/2017 0838   BILITOT 0.5 01/10/2017 6295        PENDING LABS:   RADIOGRAPHIC STUDIES:  No results found.   PATHOLOGY:    ASSESSMENT AND PLAN:  Diffuse large B cell lymphoma (HCC) Stage IV DLBCL arising from follicular lymphoma with breast lesion biopsy proven to be DLBCL, beginning R-CHOP with curative intent on 11/29/2016.  She has presumed Stage IV disease with hypermetabolic hepatic  lesion (not biopsy proven).  Oncology history is updated.   Pre-treatment labs today: CBC diff, CMET, uric acid, phosphorus.  I personally reviewed and went over laboratory results with the patient.  The results are noted within this dictation.    She reports hard stools and therefore I will provide her constipation sheet.  She is using MiraLAX.  PET scan for restaging/response evaluation is scheduled for 01/28/2017.  Nasophayngeal soft tissue hypermetabolic activity is noted on initial staging PET scan.  If improved on restaging PET, we will need to consider prophylactic CNS treatment with MTX.  I have discussed the case with Dr. Abigail Miyamoto (radiologist) regarding this left nasopharyngeal hypermetabolic area.  He is pretty convinced that this is malignancy (lymphoma versus primary head and neck).  He reports that an MRI will not provide much additional information.  Also, due to hypermetabolic liver lesion, if this improves on restaging PET, then is favors that she has Stage IV disease at time of diagnosis.  On our last encounter, we discussed the role of intrathecal MTX due to the hypermetabolic left nasopharyngeal soft tissue mass.  She wanted to wait until after her PET scan to embark on this aspect of her treatment, since I cannot prove to her that this is lymphomatous involvement, although very suspicious radiographically.  If left nasopharyngeal activity improves/resolves, then we must assume that this was lymphoma and she will need prophylactic intrathecal chemotherapy. If it persists, then ENT referral would be most appropriate for biopsy.   Patient is provided education regarding intrathecal chemotherapy.  She is advised of the procedure and side effects.  I kept this conversation very elementary as to not confuse her or increase her anxiety.  At the beginning of our conversation today, she was resistant to this intervention.  After long discussion, she is more open to trying this treatment  modality.  We will wait and see what her upcoming PET scan demonstrates prior to providing definitive medical oncology recommendations regarding intrathecal chemotherapy.  Return in 3 weeks for follow-up, review of PET imaging results, to discuss IT MTX if indicated based upon PET imaging results, and to embark on cycle #4 of treatment.  ORDERS PLACED FOR THIS ENCOUNTER: Orders Placed This Encounter  Procedures  . Uric acid  . Phosphorus    MEDICATIONS PRESCRIBED THIS ENCOUNTER: No orders of the defined types were placed in this encounter.   THERAPY PLAN:  Continue with chemotherapy as planned with curative intent.  Will restage with PET imaging following cycle #3 of treatment.  All questions were answered. The patient knows to call the clinic with any problems, questions or concerns. We can certainly see the patient much sooner if necessary.  Patient and plan discussed with Dr. Twana First and she is in agreement with the aforementioned.   This note is electronically signed by: Doy Mince 01/10/2017 9:23 AM

## 2017-01-10 NOTE — Patient Instructions (Addendum)
Monica [facility] at [facility] Discharge Instructions  RECOMMENDATIONS MADE BY THE CONSULTANT AND ANY TEST RESULTS WILL BE SENT TO YOUR REFERRING PHYSICIAN.  You were seen today by Kirby Crigler PA-C. PET scan 01/28/17. Constipation sheet given. Return in 3 weeks for follow up, review PET scan results, medical oncology recommendations and treatment as scheduled.   Thank you for choosing [place] at [facility] to provide your oncology and hematology care.  To afford each patient quality time with our provider, please arrive at least 15 minutes before your scheduled appointment time.    If you have a lab appointment with the [facility] please come in thru the  Main Entrance and check in at the main information desk  You need to re-schedule your appointment should you arrive 10 or more minutes late.  We strive to give you quality time with our providers, and arriving late affects you and other patients whose appointments are after yours.  Also, if you no show three or more times for appointments you may be dismissed from the clinic at the providers discretion.     Again, thank you for choosing [facility].  Our hope is that these requests will decrease the amount of time that you wait before being seen by our physicians.       _____________________________________________________________  Should you have questions after your visit to [facility], please contact our office at [phone] between the hours of 8:30 a.m. and 4:30 p.m.  Voicemails left after 4:30 p.m. will not be returned until the following business day.  For prescription refill requests, have your pharmacy contact our office.       Resources For Cancer Patients and their Caregivers ? American Cancer Society: Can assist with transportation, wigs, general needs, runs Look Good Feel Better.        ([phone] 421-2510 ? Cancer Care: Provides financial  assistance, online support groups, medication/co-pay assistance.  1-800-813-HOPE (307)660-1549) ? [place] Assists [place] cancer patients and their families through emotional , educational and financial support.  8730139745 ? [place] DSS Where to apply for food stamps, Medicaid and utility assistance. 432-761-7736 ? RCATS: Transportation to medical appointments. 402-696-5756 ? Social Security Administration: May apply for disability if have a Stage IV cancer. 747-399-5316 973-622-6202 ? [employer], Disability and Transit Services: Assists with nutrition, care and transit needs. [place] Support Programs: @10RELATIVEDAYS @ > Cancer Support Group  2nd Tuesday of the month 1pm-2pm, Journey Room  > Creative Journey  3rd Tuesday of the month 1130am-1pm, Journey Room  > Look Good Feel Better  1st Wednesday of the month 10am-12 noon, Journey Room (Call [place] to register [phone])

## 2017-01-10 NOTE — Assessment & Plan Note (Addendum)
Stage IV DLBCL arising from follicular lymphoma with breast lesion biopsy proven to be DLBCL, beginning R-CHOP with curative intent on 11/29/2016.  She has presumed Stage IV disease with hypermetabolic hepatic lesion (not biopsy proven).  Oncology history is updated.   Pre-treatment labs today: CBC diff, CMET, uric acid, phosphorus.  I personally reviewed and went over laboratory results with the patient.  The results are noted within this dictation.    She reports hard stools and therefore I will provide her constipation sheet.  She is using MiraLAX.  PET scan for restaging/response evaluation is scheduled for 01/28/2017.  Nasophayngeal soft tissue hypermetabolic activity is noted on initial staging PET scan.  If improved on restaging PET, we will need to consider prophylactic CNS treatment with MTX.  I have discussed the case with Dr. Abigail Miyamoto (radiologist) regarding this left nasopharyngeal hypermetabolic area.  He is pretty convinced that this is malignancy (lymphoma versus primary head and neck).  He reports that an MRI will not provide much additional information.  Also, due to hypermetabolic liver lesion, if this improves on restaging PET, then is favors that she has Stage IV disease at time of diagnosis.  On our last encounter, we discussed the role of intrathecal MTX due to the hypermetabolic left nasopharyngeal soft tissue mass.  She wanted to wait until after her PET scan to embark on this aspect of her treatment, since I cannot prove to her that this is lymphomatous involvement, although very suspicious radiographically.  If left nasopharyngeal activity improves/resolves, then we must assume that this was lymphoma and she will need prophylactic intrathecal chemotherapy. If it persists, then ENT referral would be most appropriate for biopsy.   Patient is provided education regarding intrathecal chemotherapy.  She is advised of the procedure and side effects.  I kept this conversation very  elementary as to not confuse her or increase her anxiety.  At the beginning of our conversation today, she was resistant to this intervention.  After long discussion, she is more open to trying this treatment modality.  We will wait and see what her upcoming PET scan demonstrates prior to providing definitive medical oncology recommendations regarding intrathecal chemotherapy.  Return in 3 weeks for follow-up, review of PET imaging results, to discuss IT MTX if indicated based upon PET imaging results, and to embark on cycle #4 of treatment.

## 2017-01-10 NOTE — Progress Notes (Signed)
Tolerated infusions w/o adverse reaction.  Alert, in no distress.  VSS.  Discharged ambulatory in c/o spouse.  

## 2017-01-10 NOTE — Patient Instructions (Signed)
[facility] Discharge Instructions for Patients Receiving Chemotherapy   Beginning January 23rd 2017 lab work for the [facility] will be done in the  Main lab at [facility] on 1st floor. If you have a lab appointment with the [facility] please come in thru the  Main Entrance and check in at the main information desk   Today you received the following chemotherapy agents:  Adriamycin, Vincristine, Cytoxan, and Rituxan.  If you develop nausea and vomiting, or diarrhea that is not controlled by your medication, call the clinic.  The clinic phone number is [phone]. Office hours are Monday-Friday 8:30am-5:00pm.  BELOW ARE SYMPTOMS THAT SHOULD BE REPORTED IMMEDIATELY:  *FEVER GREATER THAN 101.0 F  *CHILLS WITH OR WITHOUT FEVER  NAUSEA AND VOMITING THAT IS NOT CONTROLLED WITH YOUR NAUSEA MEDICATION  *UNUSUAL SHORTNESS OF BREATH  *UNUSUAL BRUISING OR BLEEDING  TENDERNESS IN MOUTH AND THROAT WITH OR WITHOUT PRESENCE OF ULCERS  *URINARY PROBLEMS  *BOWEL PROBLEMS  UNUSUAL RASH Items with * indicate a potential emergency and should be followed up as soon as possible. If you have an emergency after office hours please contact your primary care physician or go to the nearest emergency department.  Please call the clinic during office hours if you have any questions or concerns.   You may also contact the Patient Navigator at [phone] should you have any questions or need assistance in obtaining follow up care.      Resources For Cancer Patients and their Caregivers ? American Cancer Society: Can assist with transportation, wigs, general needs, runs Look Good Feel Better.        629-176-4707 ? Cancer Care: Provides financial assistance, online support groups, medication/co-pay assistance.  1-800-813-HOPE 2706686367) ? [place] Assists [place] cancer patients and their families through emotional , educational and  financial support.  312-214-4365 ? [place] DSS Where to apply for food stamps, Medicaid and utility assistance. 417-423-3111 ? RCATS: Transportation to medical appointments. (802)850-0502 ? Social Security Administration: May apply for disability if have a Stage IV cancer. 9474827093 517-595-1780 ? [employer], Disability and Transit Services: Assists with nutrition, care and transit needs. [phone]

## 2017-01-24 ENCOUNTER — Other Ambulatory Visit (HOSPITAL_COMMUNITY): Payer: Self-pay | Admitting: Emergency Medicine

## 2017-01-24 MED ORDER — MECLIZINE HCL 25 MG PO TABS: 50.0000 mg | ORAL_TABLET | Freq: Two times a day (BID) | ORAL | 0 refills | Status: DC | PRN

## 2017-01-24 NOTE — Progress Notes (Signed)
Pt called and stated that she was having some dizziness and lightheadedness.  Pt was nauseated yesterday but took her medication and now it is better.  Today she is still having vertigo.  She states that it comes and goes.  I spoke with Dr Talbert Cage and called in meclizine 50 mg BID PRN for her and told her to let me know if this did not help.  She verbalized understanding.

## 2017-01-28 ENCOUNTER — Encounter (HOSPITAL_COMMUNITY)
Admission: RE | Admit: 2017-01-28 | Discharge: 2017-01-28 | Disposition: A | Discharge status: Home / Self Care | Payer: BLUE CROSS/BLUE SHIELD | Source: Ambulatory Visit | Attending: Oncology | Admitting: Oncology

## 2017-01-28 DIAGNOSIS — C8338 Diffuse large B-cell lymphoma, lymph nodes of multiple sites: Secondary | ICD-10-CM | POA: Diagnosis not present

## 2017-01-28 LAB — GLUCOSE, CAPILLARY: Glucose-Capillary: 89 mg/dL (ref 65–99)

## 2017-01-28 MED ORDER — FLUDEOXYGLUCOSE F - 18 (FDG) INJECTION
5.8500 | Freq: Once | INTRAVENOUS | Status: AC | PRN
  Administered 2017-01-28: 5.85 via INTRAVENOUS

## 2017-01-31 ENCOUNTER — Encounter (HOSPITAL_BASED_OUTPATIENT_CLINIC_OR_DEPARTMENT_OTHER): Payer: BLUE CROSS/BLUE SHIELD

## 2017-01-31 ENCOUNTER — Encounter (HOSPITAL_COMMUNITY): Payer: BLUE CROSS/BLUE SHIELD | Attending: Oncology | Admitting: Oncology

## 2017-01-31 ENCOUNTER — Encounter (HOSPITAL_COMMUNITY): Payer: Self-pay

## 2017-01-31 VITALS — BP 90/52 | HR 77 | Temp 98.0°F | Resp 16 | Wt 118.4 lb

## 2017-01-31 DIAGNOSIS — Z5111 Encounter for antineoplastic chemotherapy: Secondary | ICD-10-CM | POA: Diagnosis not present

## 2017-01-31 DIAGNOSIS — C8330 Diffuse large B-cell lymphoma, unspecified site: Secondary | ICD-10-CM

## 2017-01-31 DIAGNOSIS — Z5189 Encounter for other specified aftercare: Secondary | ICD-10-CM | POA: Diagnosis not present

## 2017-01-31 DIAGNOSIS — C8338 Diffuse large B-cell lymphoma, lymph nodes of multiple sites: Secondary | ICD-10-CM | POA: Diagnosis present

## 2017-01-31 DIAGNOSIS — Z5112 Encounter for antineoplastic immunotherapy: Secondary | ICD-10-CM | POA: Diagnosis not present

## 2017-01-31 LAB — CBC WITH DIFFERENTIAL/PLATELET
Basophils Absolute: 0.1 10*3/uL (ref 0.0–0.1)
Basophils Relative: 1 %
Eosinophils Absolute: 0.1 10*3/uL (ref 0.0–0.7)
Eosinophils Relative: 1 %
HEMATOCRIT: 30.3 % — AB (ref 36.0–46.0)
HEMOGLOBIN: 10.6 g/dL — AB (ref 12.0–15.0)
LYMPHS ABS: 0.3 10*3/uL — AB (ref 0.7–4.0)
LYMPHS PCT: 4 %
MCH: 32.6 pg (ref 26.0–34.0)
MCHC: 35 g/dL (ref 30.0–36.0)
MCV: 93.2 fL (ref 78.0–100.0)
MONOS PCT: 7 %
Monocytes Absolute: 0.5 10*3/uL (ref 0.1–1.0)
NEUTROS PCT: 88 %
Neutro Abs: 6.8 10*3/uL (ref 1.7–7.7)
Platelets: 297 10*3/uL (ref 150–400)
RBC: 3.25 MIL/uL — AB (ref 3.87–5.11)
RDW: 21.2 % — ABNORMAL HIGH (ref 11.5–15.5)
WBC: 7.7 10*3/uL (ref 4.0–10.5)

## 2017-01-31 LAB — COMPREHENSIVE METABOLIC PANEL
ALT: 15 U/L (ref 14–54)
ANION GAP: 6 (ref 5–15)
AST: 21 U/L (ref 15–41)
Albumin: 4 g/dL (ref 3.5–5.0)
Alkaline Phosphatase: 40 U/L (ref 38–126)
BUN: 15 mg/dL (ref 6–20)
CHLORIDE: 103 mmol/L (ref 101–111)
CO2: 26 mmol/L (ref 22–32)
CREATININE: 0.53 mg/dL (ref 0.44–1.00)
Calcium: 9 mg/dL (ref 8.9–10.3)
Glucose, Bld: 118 mg/dL — ABNORMAL HIGH (ref 65–99)
Potassium: 3.9 mmol/L (ref 3.5–5.1)
SODIUM: 135 mmol/L (ref 135–145)
Total Bilirubin: 0.5 mg/dL (ref 0.3–1.2)
Total Protein: 6.8 g/dL (ref 6.5–8.1)

## 2017-01-31 MED ORDER — PALONOSETRON HCL INJECTION 0.25 MG/5ML
0.2500 mg | Freq: Once | INTRAVENOUS | Status: AC
  Administered 2017-01-31: 0.25 mg via INTRAVENOUS

## 2017-01-31 MED ORDER — DOXORUBICIN HCL CHEMO IV INJECTION 2 MG/ML
50.0000 mg/m2 | Freq: Once | INTRAVENOUS | Status: AC
  Administered 2017-01-31: 78 mg via INTRAVENOUS
  Filled 2017-01-31: qty 50

## 2017-01-31 MED ORDER — RITUXIMAB CHEMO INJECTION 500 MG/50ML
375.0000 mg/m2 | Freq: Once | INTRAVENOUS | Status: AC
  Administered 2017-01-31: 600 mg via INTRAVENOUS
  Filled 2017-01-31: qty 50

## 2017-01-31 MED ORDER — SODIUM CHLORIDE 0.9 % IV SOLN: 10.0000 mg | Freq: Once | INTRAVENOUS | Status: DC

## 2017-01-31 MED ORDER — HEPARIN SOD (PORK) LOCK FLUSH 100 UNIT/ML IV SOLN
500.0000 [IU] | Freq: Once | INTRAVENOUS | Status: AC | PRN
  Administered 2017-01-31: 500 [IU]
  Filled 2017-01-31: qty 5

## 2017-01-31 MED ORDER — SODIUM CHLORIDE 0.9 % IV SOLN
Freq: Once | INTRAVENOUS | Status: AC
  Administered 2017-01-31: 10:00:00 via INTRAVENOUS

## 2017-01-31 MED ORDER — ACETAMINOPHEN 325 MG PO TABS: 650.0000 mg | ORAL_TABLET | Freq: Once | ORAL | Status: DC

## 2017-01-31 MED ORDER — DEXAMETHASONE SODIUM PHOSPHATE 10 MG/ML IJ SOLN
INTRAMUSCULAR | Status: AC
  Filled 2017-01-31: qty 1

## 2017-01-31 MED ORDER — SODIUM CHLORIDE 0.9 % IV SOLN
750.0000 mg/m2 | Freq: Once | INTRAVENOUS | Status: AC
  Administered 2017-01-31: 1180 mg via INTRAVENOUS
  Filled 2017-01-31: qty 12

## 2017-01-31 MED ORDER — VINCRISTINE SULFATE CHEMO INJECTION 1 MG/ML
2.0000 mg | Freq: Once | INTRAVENOUS | Status: AC
  Administered 2017-01-31: 2 mg via INTRAVENOUS
  Filled 2017-01-31: qty 2

## 2017-01-31 MED ORDER — PEGFILGRASTIM 6 MG/0.6ML ~~LOC~~ PSKT
6.0000 mg | PREFILLED_SYRINGE | Freq: Once | SUBCUTANEOUS | Status: AC
  Administered 2017-01-31: 6 mg via SUBCUTANEOUS

## 2017-01-31 MED ORDER — DIPHENHYDRAMINE HCL 25 MG PO CAPS: 50.0000 mg | ORAL_CAPSULE | Freq: Once | ORAL | Status: DC

## 2017-01-31 MED ORDER — PALONOSETRON HCL INJECTION 0.25 MG/5ML
INTRAVENOUS | Status: AC
  Filled 2017-01-31: qty 5

## 2017-01-31 MED ORDER — SODIUM CHLORIDE 0.9% FLUSH
10.0000 mL | INTRAVENOUS | Status: DC | PRN
  Administered 2017-01-31: 10 mL
  Filled 2017-01-31: qty 10

## 2017-01-31 MED ORDER — DEXAMETHASONE SODIUM PHOSPHATE 10 MG/ML IJ SOLN
10.0000 mg | Freq: Once | INTRAMUSCULAR | Status: AC
  Administered 2017-01-31: 10 mg via INTRAVENOUS

## 2017-01-31 NOTE — Patient Instructions (Signed)
[facility] Discharge Instructions  RECOMMENDATIONS MADE BY THE CONSULTANT AND ANY TEST RESULTS WILL BE SENT TO YOUR REFERRING PHYSICIAN.  You were seen today by Dr. Louise Zhou Follow up in 3 weeks See Amy up front for appointments '  Thank you for choosing [facility] to provide your oncology and hematology care.  To afford each patient quality time with our provider, please arrive at least 15 minutes before your scheduled appointment time.    If you have a lab appointment with the Cancer Center please come in thru the  Main Entrance and check in at the main information desk  You need to re-schedule your appointment should you arrive 10 or more minutes late.  We strive to give you quality time with our providers, and arriving late affects you and other patients whose appointments are after yours.  Also, if you no show three or more times for appointments you may be dismissed from the clinic at the providers discretion.     Again, thank you for choosing [facility].  Our hope is that these requests will decrease the amount of time that you wait before being seen by our physicians.       _____________________________________________________________  Should you have questions after your visit to [facility], please contact our office at [phone] between the hours of 8:30 a.m. and 4:30 p.m.  Voicemails left after 4:30 p.m. will not be returned until the following business day.  For prescription refill requests, have your pharmacy contact our office.       Resources For Cancer Patients and their Caregivers ? American Cancer Society: Can assist with transportation, wigs, general needs, runs Look Good Feel Better.        1-888-227-6333 ? Cancer Care: Provides financial assistance, online support groups, medication/co-pay assistance.  1-800-813-HOPE (4673) ? [facility] Assists [place] cancer patients and their families through emotional , educational and financial support.  336-427-4357 ? [place] DSS Where to apply for food stamps, Medicaid and utility assistance. 336-342-1394 ? RCATS: Transportation to medical appointments. 336-347-2287 ? Social Security Administration: May apply for disability if have a Stage IV cancer. 336-342-7796 1-800-772-1213 ? [place] Aging, Disability and Transit Services: Assists with nutrition, care and transit needs. 336-349-2343  Cancer Center Support Programs: [email or web address] > Cancer Support Group  2nd Tuesday of the month 1pm-2pm, Journey Room  > Creative Journey  3rd Tuesday of the month 1130am-1pm, Journey Room  > Look Good Feel Better  1st Wednesday of the month 10am-12 noon, Journey Room (Call American Cancer Society to register 1-800-395-5775)    

## 2017-01-31 NOTE — Progress Notes (Signed)
Chemotherapy given today per orders. Patient tolerated it well. Vitals stable and discharged home from clinic ambulatory. Follow up as scheduled. 

## 2017-01-31 NOTE — Progress Notes (Signed)
[facility], MD [place] [address]  DLBCL  PROGRESS NOTE  CURRENT THERAPY: R-CHOP beginning on 11/29/2016.  INTERVAL HISTORY: Monica Gray 66 y.o. 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  Of note, breast lesion biopsy proven to be DLBCL.  Beginning R-CHOP with curative intent on 11/29/2016.  NEGATIVE bone marrow aspiration and biopsy for involvement of DLBCL.    Diffuse large B cell lymphoma ([place])   09/25/2016 Imaging    CT neck- 1. Large right level 5A a nodal mass measuring up to 2.9 cm. This is suggestive of metastatic disease from an unknown primary. Histologic sampling is recommended. 2. Pleural masses of the medial posterior right chest and anterior lateral left chest. The larger mass, adjacent to the right aspect of the T3 and T4 vertebral bodies, extends into the T2-T3 and T3-T4 neural foramina without definite extension into the spinal canal. These masses are also favored to be metastases. A primary pleural malignancy such as mesothelioma is also a consideration.      10/03/2016 Procedure    Needle core biopsy of right cervical lymph node by IR.      10/04/2016 Pathology Results    Interpretation Tissue-Flow Cytometry - INSUFFICIENT CELLS FOR ANALYSIS.      10/08/2016 Pathology Results    Diagnosis Lymph node, needle/core biopsy, right cervical - ATYPICAL LYMPHOID PROLIFERATION, SUSPICIOUS FOR NON-HODGKIN B-CELL LYMPHOMA.      10/31/2016 Procedure    Excisional lymph node biopsy by Dr. Dalbert Batman      11/02/2016 Pathology Results    Interpretation Tissue-Flow Cytometry - B-CELL POPULATION WITH KAPPA LIGHT CHAIN EXCESS.      11/02/2016 Pathology Results    Lymph node, biopsy, Deep Cervical - FOLLICULAR AND DIFFUSE LARGE B-CELL LYMPHOMA.      11/09/2016 PET scan    1. Active lymphoma within the neck, chest, abdomen, and  pelvis, as detailed above. 2. Medial right breast hypermetabolic nodule is suspicious for a synchronous breast primary. Differential considerations include breast lymphoma or a hypermetabolic benign lesion. Consider diagnostic right-sided mammogram and ultrasound. If the patient is diagnosed with right breast cancer, hypermetabolic small right axillary node would be indeterminate for lymphoma versus metastasis. 3. Right larger than left pleural effusions. 4.  Aortic atherosclerosis. 5. Hypermetabolic liver lesion is favored to represent lymphoma.      11/09/2016 Imaging    CT CAP- 1. Extensive adenopathy in the chest, abdomen and pelvis, bilateral pleural nodularity/masses, omental nodularity and renal lesions, most consistent with lymphoma. 2. Bilateral pleural effusions, large on the right and moderate on the left. 3. Lucent lesion in the L1 vertebral body, indeterminate but well-circumscribed and possibly benign. Attention on followup exams is warranted. 4. Aortic atherosclerosis (ICD10-170.0). Coronary artery calcification. 5. Right adrenal adenoma      11/14/2016 Procedure    Bone marrow aspiration and biopsy by IR      11/16/2016 Pathology Results    Bone Marrow, Aspirate,Biopsy, and Clot - SLIGHTLY HYPERCELLULAR BONE MARROW FOR AGE WITH TRILINEAGE HEMATOPOIESIS. - SEVERAL LYMPHOID AGGREGATES PRESENT. - SEE COMMENT PERIPHERAL BLOOD: - NO SIGNIFICANT MORPHOLOGIC ABNORMALITIES. Comment: Lower grade lymphoproliferative process cannot be entirely excluded especially given the paratrabecular location of some of the lymphoid aggregates, the overall findings are very limited and not considered disgnostic. There is no marrow involvement by large cell lymphoma.      11/16/2016 Procedure    BREAST  BIOPSY      11/16/2016 Pathology Results    Breast, right, needle core biopsy, 2:30 o'clock, 3 cm from nipple - NON HODGKIN'S B CELL LYMPHOMA. - SEE ONCOLOGY TABLE. 2. Lymph  node, needle/core biopsy, right axilla - NON HODGKIN'S B CELL LYMPHOMA. - SEE ONCOLOGY TABLE. Microscopic Comment 1. LYMPHOMA      11/20/2016 Echocardiogram    Left ventricle: The cavity size was normal. Wall thickness was normal. Systolic function was normal. The estimated ejection fraction was in the range of 60% to 65%. Doppler parameters are consistent with abnormal left ventricular relaxation (grade 1 diastolic dysfunction). Doppler parameters are consistent with high ventricular filling pressure.      11/20/2016 Procedure    Successful ultrasound guided right thoracentesis yielding 800 mL of pleural fluid.      11/20/2016 Pathology Results    PLEURAL FLUID, RIGHT - REACTIVE MESOTHELIAL CELLS AND SMALL LYMPHOID CELLS PRESENT. - SEE COMMENT. [name] MD Pathologist, Electronic Signature (Case signed 11/22/2016)      11/29/2016 -  Chemotherapy    The patient had DOXOrubicin (ADRIAMYCIN) chemo injection 78 mg, 50 mg/m2 = 78 mg, Intravenous,  Once, 3 of 6 cycles  palonosetron (ALOXI) injection 0.25 mg, 0.25 mg, Intravenous,  Once, 3 of 6 cycles  pegfilgrastim (NEULASTA) injection 6 mg, 6 mg, Subcutaneous,  Once, 1 of 1 cycle  pegfilgrastim (NEULASTA ONPRO KIT) injection 6 mg, 6 mg, Subcutaneous, Once, 2 of 5 cycles  vinCRIStine (ONCOVIN) 2 mg in sodium chloride 0.9 % 50 mL chemo infusion, 2 mg, Intravenous,  Once, 3 of 6 cycles  riTUXimab (RITUXAN) 600 mg in sodium chloride 0.9 % 250 mL (1.9355 mg/mL) chemo infusion, 375 mg/m2 = 600 mg, Intravenous,  Once, 3 of 6 cycles  cyclophosphamide (CYTOXAN) 1,180 mg in sodium chloride 0.9 % 250 mL chemo infusion, 750 mg/m2 = 1,180 mg, Intravenous,  Once, 3 of 6 cycles  for chemotherapy treatment.        01/28/2017 PET scan    1. Marked improvement with essential resolution of the hypermetabolic activity in the adenopathy of the neck, chest, abdomen, and pelvis, and marked reduced size of associated lymph nodes. A right  inguinal lymph node is currently Deauville 2. Most of the prior lymph nodes are Deauville 1. 2. The left posterior nasopharyngeal activity has also resolved. 3. Moderately hypermetabolic small right thyroid nodule with maximum SUV 11.9. A significant minority of such hypermetabolic thyroid nodules can be malignant, and thyroid ultrasound should be considered for further characterization. 4. Other imaging findings of potential clinical significance: Trace residual right pleural effusion. Old granulomatous disease. Aortic arch and abdominal aortoiliac atherosclerotic vascular disease.       Monica Gray presents to the clinic today accompanied by her husband for day 1 cycle 4 R-CHOP q21d. She presents in a treatment bed.  She has been responding well to treatment. She states she has been tolerating treatment well.   She is very concerned and scared about having any procedure done on her spinal fluid for intrathecal methotrexate. She notes her sister had a procedure done in her spine and it never healed.    She states she had an episode of vertigo the night before March 7. She called our clinic and received meclizine for it. She notes she took them for 2 or 3 days. She notes it "got me so high" so she discontinued taking them. This morning she states she turned her head to look at the time and felt "a little dizzy",  but it subsided shortly after.   She had nausea after treatment more so than compared to last cycle. She notes she has been eating more and gained some weight. She notes she ate yogurt every day and it helped prevent mouth sores.  She states she felt very tired compared to last time after treatment.She felt super energetic for 4 days after treatment and felt like she couldn't calm down. She also notes that she crashes after those 4 days and feels fatigued.   She states she has severe rhinorrhea which she attributes to the medicine she is talking.   She was very concerned about the  financial costs of the following treatments as she notes she is very deep in debt. She notes that she hasn't received the bill for her first chemotherapy treatment yet.    She notes that she has started to file for disability. She asked if she could work if she was on disability.    Review of Systems  Constitutional: Positive for malaise/fatigue.  HENT: Negative.        Severe rhinorrhea.   Eyes: Negative.   Respiratory: Negative.   Cardiovascular: Negative.   Gastrointestinal: Positive for nausea (post treatment worse compared to last cycle).  Genitourinary: Negative.   Musculoskeletal: Negative.   Skin: Negative.   Neurological: Positive for dizziness (episode of vertigo).  Endo/Heme/Allergies: Negative.   Psychiatric/Behavioral: Negative.   All other systems reviewed and are negative.   Past Medical History:  Diagnosis Date  . Allergic to cats   . Anxiety   . Arthritis    hands  . Diffuse large B cell lymphoma ([place]) 10/31/2016  . Dyspnea    with exertion - intermittent, per pt.; no O2  . History of asthma    as a child  . History of hepatitis C    states was cured in 2016 after [facility]  . Immature cataract   . Sensitive skin   . Wears dentures    upper; lower denture attaches to 4 dental implants, per pt.    Past Surgical History:  Procedure Laterality Date  . BONE MARROW ASPIRATION Right 11/14/2016   iliac  . BREAST BIOPSY Right   . CHEST TUBE INSERTION     1987, 1988  . LAPAROSCOPIC UNILATERAL SALPINGO OOPHERECTOMY     ectopic pregnancy  . LYMPH NODE BIOPSY Right 10/31/2016   Procedure: EXCISION DEEP RIGHT CERVICAL LYMPH NODES;  Surgeon: Fanny Skates, MD;  Location: [facility];  Service: General;  Laterality: Right;  . PORTACATH PLACEMENT Left 11/21/2016   Procedure: INSERTION PORT-A-CATH WITH [place];  Surgeon: Fanny Skates, MD;  Location: [facility];  Service: General;  Laterality: Left;    Family History  Problem Relation Age of Onset  .  Leukemia Mother   . Emphysema Father     Social History   Social History  . Marital status: Married    Spouse name: N/A  . Number of children: N/A  . Years of education: N/A   Social History Main Topics  . Smoking status: Former Smoker    Quit date: 1984  . Smokeless tobacco: Never Used  . Alcohol use No  . Drug use: No  . Sexual activity: Not Asked   Other Topics Concern  . None   Social History Narrative  . None     PHYSICAL EXAMINATION  ECOG PERFORMANCE STATUS: 1 - Symptomatic but completely ambulatory    Physical Exam  Constitutional: She is oriented to person, place, and time  and well-developed, well-nourished, and in no distress.  Physical exam was performed in treatment bed.  HENT:  Head: Normocephalic and atraumatic.  Eyes: Conjunctivae and EOM are normal. Pupils are equal, round, and reactive to light.  Neck: Normal range of motion. Neck supple.  Cardiovascular: Normal rate, regular rhythm and normal heart sounds.   Pulmonary/Chest: Effort normal and breath sounds normal.  Abdominal: Soft. Bowel sounds are normal.  Musculoskeletal: Normal range of motion.  Neurological: She is alert and oriented to person, place, and time. Gait normal.  Skin: Skin is warm and dry.  Nursing note and vitals reviewed.   LABORATORY DATA: CBC    Component Value Date/Time   WBC 7.7 01/31/2017 0844   RBC 3.25 (L) 01/31/2017 0844   HGB 10.6 (L) 01/31/2017 0844   HCT 30.3 (L) 01/31/2017 0844   PLT 297 01/31/2017 0844   MCV 93.2 01/31/2017 0844   MCH 32.6 01/31/2017 0844   MCHC 35.0 01/31/2017 0844   RDW 21.2 (H) 01/31/2017 0844   LYMPHSABS 0.3 (L) 01/31/2017 0844   MONOABS 0.5 01/31/2017 0844   EOSABS 0.1 01/31/2017 0844   BASOSABS 0.1 01/31/2017 0844      Chemistry      Component Value Date/Time   NA 135 01/31/2017 0844   K 3.9 01/31/2017 0844   CL 103 01/31/2017 0844   CO2 26 01/31/2017 0844   BUN 15 01/31/2017 0844   CREATININE 0.53 01/31/2017 0844        Component Value Date/Time   CALCIUM 9.0 01/31/2017 0844   ALKPHOS 40 01/31/2017 0844   AST 21 01/31/2017 0844   ALT 15 01/31/2017 0844   BILITOT 0.5 01/31/2017 0844        PENDING LABS:   RADIOGRAPHIC STUDIES: I have personally reviewed the radiological images as listed and agreed with the findings in the report.  NUCLEAR MEDICINE PET SKULL BASE TO THIGH 01/28/2017  IMPRESSION: 1. Marked improvement with essential resolution of the hypermetabolic activity in the adenopathy of the neck, chest, abdomen, and pelvis, and marked reduced size of associated lymph nodes. A right inguinal lymph node is currently Deauville 2. Most of the prior lymph nodes are Deauville 1. 2. The left posterior nasopharyngeal activity has also resolved. 3. Moderately hypermetabolic small right thyroid nodule with maximum SUV 11.9. A significant minority of such hypermetabolic thyroid nodules can be malignant, and thyroid ultrasound should be considered for further characterization. 4. Other imaging findings of potential clinical significance: Trace residual right pleural effusion. Old granulomatous disease. Aortic arch and abdominal aortoiliac atherosclerotic vascular disease.   PATHOLOGY:    ASSESSMENT AND PLAN:  Stage IV DLBCL arising from follicular lymphoma with PET suspicious for hypermetabolic liver lesion (not biopsy proven).  Of note, breast lesion biopsy proven to be DLBCL.  Beginning R-CHOP with curative intent on 11/29/2016.  NEGATIVE bone marrow aspiration and biopsy for involvement of DLBCL.  PLAN: Discussed results of restaging PET scan in detail with the patient today. She's had a great response. Proceed with planned chemotherapy with 6 cycles of R-CHOP. She will be getting cycle 4 today. She is tolerating chemotherapy well. Plan to proceed with intrathecal methotrexate once she completes her R-CHOP.  Restaging PET scan to be ordered 8 weeks after she completes systemic  chemotherapy. RTC in 3 weeks for follow up.   THERAPY PLAN:  Cycle 4 of R-CHOP today.  All questions were answered. The patient knows to call the clinic with any problems, questions or concerns. We can certainly see  the patient much sooner if necessary.  This document serves as a record of services personally performed by Twana First, MD. It was created on her behalf by Shirlean Mylar, a trained medical scribe. The creation of this record is based on the scribe's personal observations and the provider's statements to them. This document has been checked and approved by the attending provider.  I have reviewed the above documentation for accuracy and completeness and I agree with the above.  This note is electronically signed by: Mikey College 01/31/2017 9:25 AM

## 2017-01-31 NOTE — Patient Instructions (Signed)
[facility] Discharge Instructions for Patients Receiving Chemotherapy   Beginning January 23rd 2017 lab work for the Cancer Center will be done in the  Main lab at [facility] on 1st floor. If you have a lab appointment with the Cancer Center please come in thru the  Main Entrance and check in at the main information desk   Today you received the following chemotherapy agents   To help prevent nausea and vomiting after your treatment, we encourage you to take your nausea medication     If you develop nausea and vomiting, or diarrhea that is not controlled by your medication, call the clinic.  The clinic phone number is [phone]. Office hours are Monday-Friday 8:30am-5:00pm.  BELOW ARE SYMPTOMS THAT SHOULD BE REPORTED IMMEDIATELY:  *FEVER GREATER THAN 101.0 F  *CHILLS WITH OR WITHOUT FEVER  NAUSEA AND VOMITING THAT IS NOT CONTROLLED WITH YOUR NAUSEA MEDICATION  *UNUSUAL SHORTNESS OF BREATH  *UNUSUAL BRUISING OR BLEEDING  TENDERNESS IN MOUTH AND THROAT WITH OR WITHOUT PRESENCE OF ULCERS  *URINARY PROBLEMS  *BOWEL PROBLEMS  UNUSUAL RASH Items with * indicate a potential emergency and should be followed up as soon as possible. If you have an emergency after office hours please contact your primary care physician or go to the nearest emergency department.  Please call the clinic during office hours if you have any questions or concerns.   You may also contact the Patient Navigator at [phone] should you have any questions or need assistance in obtaining follow up care.      Resources For Cancer Patients and their Caregivers ? American Cancer Society: Can assist with transportation, wigs, general needs, runs Look Good Feel Better.        1-888-227-6333 ? Cancer Care: Provides financial assistance, online support groups, medication/co-pay assistance.  1-800-813-HOPE (4673) ? [facility] Assists [place] cancer  patients and their families through emotional , educational and financial support.  336-427-4357 ? [place] DSS Where to apply for food stamps, Medicaid and utility assistance. 336-342-1394 ? RCATS: Transportation to medical appointments. 336-347-2287 ? Social Security Administration: May apply for disability if have a Stage IV cancer. 336-342-7796 1-800-772-1213 ? [place] Aging, Disability and Transit Services: Assists with nutrition, care and transit needs. 336-349-2343         

## 2017-02-20 NOTE — Progress Notes (Signed)
Monica Gray, [place] [address]   CLINIC:  Medical Oncology/Hematology  PCP:  Monica Blitz, MD [place] [address] [phone]   REASON FOR VISIT:  Follow-up for Stage IV diffuse large B-cell lymphoma (DLBCL)   CURRENT THERAPY: R-CHOP chemotherapy regimen beginning on 11/29/16   BRIEF ONCOLOGIC HISTORY:    Diffuse large B cell lymphoma ([place])   09/25/2016 Imaging    CT neck- 1. Large right level 5A a nodal mass measuring up to 2.9 cm. This is suggestive of metastatic disease from an unknown primary. Histologic sampling is recommended. 2. Pleural masses of the medial posterior right chest and anterior lateral left chest. The larger mass, adjacent to the right aspect of the T3 and T4 vertebral bodies, extends into the T2-T3 and T3-T4 neural foramina without definite extension into the spinal canal. These masses are also favored to be metastases. A primary pleural malignancy such as mesothelioma is also a consideration.      10/03/2016 Procedure    Needle core biopsy of right cervical lymph node by IR.      10/04/2016 Pathology Results    Interpretation Tissue-Flow Cytometry - INSUFFICIENT CELLS FOR ANALYSIS.      10/08/2016 Pathology Results    Diagnosis Lymph node, needle/core biopsy, right cervical - ATYPICAL LYMPHOID PROLIFERATION, SUSPICIOUS FOR NON-HODGKIN B-CELL LYMPHOMA.      10/31/2016 Procedure    Excisional lymph node biopsy by Dr. Dalbert Batman      11/02/2016 Pathology Results    Interpretation Tissue-Flow Cytometry - B-CELL POPULATION WITH KAPPA LIGHT CHAIN EXCESS.      11/02/2016 Pathology Results    Lymph node, biopsy, Deep Cervical - FOLLICULAR AND DIFFUSE LARGE B-CELL LYMPHOMA.      11/09/2016 PET scan    1. Active lymphoma within the neck, chest, abdomen, and pelvis, as detailed above. 2. Medial right breast hypermetabolic nodule is suspicious for a synchronous breast primary. Differential  considerations include breast lymphoma or a hypermetabolic benign lesion. Consider diagnostic right-sided mammogram and ultrasound. If the patient is diagnosed with right breast cancer, hypermetabolic small right axillary node would be indeterminate for lymphoma versus metastasis. 3. Right larger than left pleural effusions. 4.  Aortic atherosclerosis. 5. Hypermetabolic liver lesion is favored to represent lymphoma.      11/09/2016 Imaging    CT CAP- 1. Extensive adenopathy in the chest, abdomen and pelvis, bilateral pleural nodularity/masses, omental nodularity and renal lesions, most consistent with lymphoma. 2. Bilateral pleural effusions, large on the right and moderate on the left. 3. Lucent lesion in the L1 vertebral body, indeterminate but well-circumscribed and possibly benign. Attention on followup exams is warranted. 4. Aortic atherosclerosis (ICD10-170.0). Coronary artery calcification. 5. Right adrenal adenoma      11/14/2016 Procedure    Bone marrow aspiration and biopsy by IR      11/16/2016 Pathology Results    Bone Marrow, Aspirate,Biopsy, and Clot - SLIGHTLY HYPERCELLULAR BONE MARROW FOR AGE WITH TRILINEAGE HEMATOPOIESIS. - SEVERAL LYMPHOID AGGREGATES PRESENT. - SEE COMMENT PERIPHERAL BLOOD: - NO SIGNIFICANT MORPHOLOGIC ABNORMALITIES. Comment: Lower grade lymphoproliferative process cannot be entirely excluded especially given the paratrabecular location of some of the lymphoid aggregates, the overall findings are very limited and not considered disgnostic. There is no marrow involvement by large cell lymphoma.      11/16/2016 Procedure    BREAST BIOPSY      11/16/2016 Pathology Results    Breast, right, needle core biopsy, 2:30 o'clock, 3 cm from nipple -  NON HODGKIN'S B CELL LYMPHOMA. - SEE ONCOLOGY TABLE. 2. Lymph node, needle/core biopsy, right axilla - NON HODGKIN'S B CELL LYMPHOMA. - SEE ONCOLOGY TABLE. Microscopic Comment 1. LYMPHOMA       11/20/2016 Echocardiogram    Left ventricle: The cavity size was normal. Wall thickness was normal. Systolic function was normal. The estimated ejection fraction was in the range of 60% to 65%. Doppler parameters are consistent with abnormal left ventricular relaxation (grade 1 diastolic dysfunction). Doppler parameters are consistent with high ventricular filling pressure.      11/20/2016 Procedure    Successful ultrasound guided right thoracentesis yielding 800 mL of pleural fluid.      11/20/2016 Pathology Results    PLEURAL FLUID, RIGHT - REACTIVE MESOTHELIAL CELLS AND SMALL LYMPHOID CELLS PRESENT. - SEE COMMENT. Monica Greenhouse MD Pathologist, Electronic Signature (Case signed 11/22/2016)      11/29/2016 -  Chemotherapy    The patient had DOXOrubicin (ADRIAMYCIN) chemo injection 78 mg, 50 mg/m2 = 78 mg, Intravenous,  Once, 3 of 6 cycles  palonosetron (ALOXI) injection 0.25 mg, 0.25 mg, Intravenous,  Once, 3 of 6 cycles  pegfilgrastim (NEULASTA) injection 6 mg, 6 mg, Subcutaneous,  Once, 1 of 1 cycle  pegfilgrastim (NEULASTA ONPRO KIT) injection 6 mg, 6 mg, Subcutaneous, Once, 2 of 5 cycles  vinCRIStine (ONCOVIN) 2 mg in sodium chloride 0.9 % 50 mL chemo infusion, 2 mg, Intravenous,  Once, 3 of 6 cycles  riTUXimab (RITUXAN) 600 mg in sodium chloride 0.9 % 250 mL (1.9355 mg/mL) chemo infusion, 375 mg/m2 = 600 mg, Intravenous,  Once, 3 of 6 cycles  cyclophosphamide (CYTOXAN) 1,180 mg in sodium chloride 0.9 % 250 mL chemo infusion, 750 mg/m2 = 1,180 mg, Intravenous,  Once, 3 of 6 cycles  for chemotherapy treatment.        01/28/2017 PET scan    1. Marked improvement with essential resolution of the hypermetabolic activity in the adenopathy of the neck, chest, abdomen, and pelvis, and marked reduced size of associated lymph nodes. A right inguinal lymph node is currently Deauville 2. Most of the prior lymph nodes are Deauville 1. 2. The left posterior nasopharyngeal  activity has also resolved. 3. Moderately hypermetabolic small right thyroid nodule with maximum SUV 11.9. A significant minority of such hypermetabolic thyroid nodules can be malignant, and thyroid ultrasound should be considered for further characterization. 4. Other imaging findings of potential clinical significance: Trace residual right pleural effusion. Old granulomatous disease. Aortic arch and abdominal aortoiliac atherosclerotic vascular disease.        HISTORY OF PRESENT ILLNESS:  (From Dr. Laverle Patter last note on 01/31/17)     INTERVAL HISTORY:  Mr. Abee 66 y.o. female returns for follow-up and consideration for next cycle of chemotherapy.   She is due for cycle #5 R-CHOP today. Overall, she feels pretty well. She reports feeling more tired; her energy levels are about about 75% of her baseline.  Her appetite remains excellent at 100%.    She reports having occasional nausea and intermittent diarrhea. They are generally managed well with her PRN medications.  One of her bigger concerns today is hiccups; states they occurred about 1-2 days after last cycle of chemo and persisted for a few days. Her husband states that they were "violent and sounded like she was miserable."  Patient wants to know if there is a medication she can take to help.    She states that the lump in her right groin is still there; she feels like  it is getting smaller. She is worried about the possibility of radiation or intrathecal chemotherapy.    She denies any pain. She continues to have dizziness at times, which has been a chronic issue for her.  Otherwise, she is largely without complaints today.     REVIEW OF SYSTEMS:  Review of Systems  Constitutional: Positive for fatigue. Negative for appetite change, chills and fever.  HENT:  Negative.   Eyes: Negative.   Respiratory: Negative.   Cardiovascular: Negative.  Negative for leg swelling.  Gastrointestinal: Positive for diarrhea and nausea.  Negative for blood in stool, constipation and vomiting.  Endocrine: Negative.   Genitourinary: Negative.  Negative for dysuria and hematuria.   Musculoskeletal: Negative.   Skin: Negative.  Negative for rash.  Neurological: Positive for dizziness. Negative for headaches.  Hematological: Positive for adenopathy.  Psychiatric/Behavioral: The patient is nervous/anxious.      PAST MEDICAL/SURGICAL HISTORY:  Past Medical History:  Diagnosis Date  . Allergic to cats   . Anxiety   . Arthritis    hands  . Diffuse large B cell lymphoma ([place]) 10/31/2016  . Dyspnea    with exertion - intermittent, per pt.; no O2  . History of asthma    as a child  . History of hepatitis C    states was cured in 2016 after [facility]  . Immature cataract   . Sensitive skin   . Wears dentures    upper; lower denture attaches to 4 dental implants, per pt.   Past Surgical History:  Procedure Laterality Date  . BONE MARROW ASPIRATION Right 11/14/2016   iliac  . BREAST BIOPSY Right   . CHEST TUBE INSERTION     1987, 1988  . LAPAROSCOPIC UNILATERAL SALPINGO OOPHERECTOMY     ectopic pregnancy  . LYMPH NODE BIOPSY Right 10/31/2016   Procedure: EXCISION DEEP RIGHT CERVICAL LYMPH NODES;  Surgeon: Fanny Skates, MD;  Location: [facility];  Service: General;  Laterality: Right;  . PORTACATH PLACEMENT Left 11/21/2016   Procedure: INSERTION PORT-A-CATH WITH [place];  Surgeon: Fanny Skates, MD;  Location: [facility];  Service: General;  Laterality: Left;     SOCIAL HISTORY:  Social History   Social History  . Marital status: Married    Spouse name: N/A  . Number of children: N/A  . Years of education: N/A   Occupational History  . Not on file.   Social History Main Topics  . Smoking status: Former Smoker    Quit date: 1984  . Smokeless tobacco: Never Used  . Alcohol use No  . Drug use: No  . Sexual activity: Not on file   Other Topics Concern  . Not on file   Social History Narrative  .  No narrative on file    FAMILY HISTORY:  Family History  Problem Relation Age of Onset  . Leukemia Mother   . Emphysema Father     CURRENT MEDICATIONS:  Outpatient Encounter Prescriptions as of 02/21/2017  Medication Sig  . allopurinol (ZYLOPRIM) 300 MG tablet Take 1 tablet (300 mg total) by mouth daily.  Marland Kitchen ALPRAZolam (XANAX) 0.25 MG tablet Take 2 tablets as needed three times a day  . BANOPHEN 12.5 MG/5ML liquid   . cyclophosphamide (CYTOXAN) 2 g chemo injection Inject into the vein once.  . Diphenhyd-Hydrocort-Nystatin (FIRST-DUKES MOUTHWASH) SUSP Diphenhydramine 12.5 mg/5 mL 1 part, Viscous Lidicaine 2% 1 part, Maalox 1 part, Swish and swallow 5 mL no more than every 4 hours  . DOXOrubicin  HCl (ADRIAMYCIN IV) Inject into the vein.  Marland Kitchen escitalopram (LEXAPRO) 20 MG tablet Take 1/2 tablet by mouth for 7 days then increase to one tablet daily thereafter  . Homeopathic Products (SIMILASAN DRY EYE RELIEF OP) Apply 1 drop to eye daily.  Marland Kitchen ibuprofen (ADVIL,MOTRIN) 800 MG tablet Take 800 mg by mouth every 8 (eight) hours as needed for pain.  Marland Kitchen lidocaine-prilocaine (EMLA) cream Apply to affected area once  . magnesium gluconate (MAGONATE) 500 MG tablet Take 500 mg by mouth every evening.   . meclizine (ANTIVERT) 25 MG tablet Take 2 tablets (50 mg total) by mouth 2 (two) times daily as needed for dizziness.  . Multiple Vitamins-Minerals (MULTIVITAMIN WITH MINERALS) tablet Take 1 tablet by mouth daily.  . NON FORMULARY daily. HOLY BASIL  . ondansetron (ZOFRAN) 8 MG tablet Take 1 tablet (8 mg total) by mouth every 8 (eight) hours as needed for refractory nausea / vomiting.  Marland Kitchen oxyCODONE (OXY IR/ROXICODONE) 5 MG immediate release tablet Take 1 tablet (5 mg total) by mouth every 4 (four) hours as needed for severe pain.  . predniSONE (DELTASONE) 20 MG tablet Take 3 tablets (60 mg) on days 1-5 of chemotherapy  . Probiotic CAPS Take 1 capsule by mouth every evening.  . prochlorperazine (COMPAZINE) 10 MG  tablet Take 1 tablet (10 mg total) by mouth every 6 (six) hours as needed (Nausea or vomiting).  . RiTUXimab (RITUXAN IV) Inject into the vein.  Marland Kitchen senna (SENOKOT) 8.6 MG TABS tablet Take 2 tablets by mouth daily.  . traMADol (ULTRAM) 50 MG tablet Take 1 tablet (50 mg total) by mouth 2 (two) times daily as needed.  . vinCRIStine 2 mg in sodium chloride 0.9 % 50 mL Inject 2 mg into the vein once.  . vitamin C (ASCORBIC ACID) 500 MG tablet Take 500 mg by mouth daily.   No facility-administered encounter medications on file as of 02/21/2017.     ALLERGIES:  Allergies  Allergen Reactions  . Acetaminophen Other (See Comments)    GI upset  . Codeine Other (See Comments)    GI UPSET  . Dye Fdc Red [Red Dye] Other (See Comments)    HEADACHES  . Shellfish Allergy Hives and Swelling    SWELLING OF THROAT  . Sulfa Antibiotics Other (See Comments)    GI UPSET  . Zanaflex [Tizanidine Hcl] Other (See Comments)    GI UPSET   . Adhesive [Tape] Other (See Comments)    SKIN IRRITATION     PHYSICAL EXAM:  ECOG Performance status: 1 - Symptomatic, but independent.      Physical Exam  Constitutional: She is oriented to person, place, and time and well-developed, well-nourished, and in no distress.  HENT:  Head: Normocephalic.  Eyes: Conjunctivae are normal. No scleral icterus.  Neck: Normal range of motion. Neck supple.  Cardiovascular: Normal rate and regular rhythm.   Pulmonary/Chest: Effort normal and breath sounds normal. She has no wheezes.  Abdominal: Soft. Bowel sounds are normal. There is no tenderness.  Musculoskeletal: Normal range of motion. She exhibits no edema.  Lymphadenopathy:    She has no cervical adenopathy.    She has no axillary adenopathy.       Right: Inguinal (At least 2 palpable (R) inguinal LNs measuring about 1 cm each. ) adenopathy present. No supraclavicular adenopathy present.       Left: No inguinal and no supraclavicular adenopathy present.  Neurological:  She is alert and oriented to person, place, and time. No  cranial nerve deficit.  Skin: Skin is warm and dry. No rash noted.  Psychiatric: Mood, memory, affect and judgment normal.  Nursing note and vitals reviewed.    LABORATORY DATA:  I have reviewed the labs as listed.  CBC    Component Value Date/Time   WBC 7.7 01/31/2017 0844   RBC 3.25 (L) 01/31/2017 0844   HGB 10.6 (L) 01/31/2017 0844   HCT 30.3 (L) 01/31/2017 0844   PLT 297 01/31/2017 0844   MCV 93.2 01/31/2017 0844   MCH 32.6 01/31/2017 0844   MCHC 35.0 01/31/2017 0844   RDW 21.2 (H) 01/31/2017 0844   LYMPHSABS 0.3 (L) 01/31/2017 0844   MONOABS 0.5 01/31/2017 0844   EOSABS 0.1 01/31/2017 0844   BASOSABS 0.1 01/31/2017 0844   CMP Latest Ref Rng & Units 01/31/2017 01/10/2017 12/20/2016  Glucose 65 - 99 mg/dL 118(H) 113(H) 119(H)  BUN 6 - 20 mg/dL '15 17 18  ' Creatinine 0.44 - 1.00 mg/dL 0.53 0.53 0.56  Sodium 135 - 145 mmol/L 135 134(L) 134(L)  Potassium 3.5 - 5.1 mmol/L 3.9 4.1 3.8  Chloride 101 - 111 mmol/L 103 99(L) 102  CO2 22 - 32 mmol/L '26 27 27  ' Calcium 8.9 - 10.3 mg/dL 9.0 9.4 9.3  Total Protein 6.5 - 8.1 g/dL 6.8 7.1 7.3  Total Bilirubin 0.3 - 1.2 mg/dL 0.5 0.5 0.4  Alkaline Phos 38 - 126 U/L 40 41 49  AST 15 - 41 U/L '21 23 21  ' ALT 14 - 54 U/L 15 13(L) 14    PENDING LABS:    DIAGNOSTIC IMAGING:  Most recent PET scan: 01/28/17      PATHOLOGY:  Cervical LN biopsy: 10/31/16    (R) breast biopsy: 11/16/16       ASSESSMENT & PLAN:   Stage IV diffuse large B-cell lymphoma (DLBCL):  -Diagnosed in 09/2016; negative bone marrow biopsy/aspiration.  R-CHOP chemotherapy with curative intent started on 11/29/16.   -Due for cycle #5 R-CHOP today; labs reviewed and are adequate for treatment.  -Return to cancer center in 3 weeks for final cycle (cycle #6) R-CHOP. She will need orders for intrathecal methotrexate after she completes 6th cycle of R-CHOP.   -PET scan due about 8 weeks after 6th cycle of  R-CHOP; will place orders at next visit.  We reviewed these recommendations today. She remains very concerned about the possibility of complications with lumbar puncture. I discussed the details with her in simple terms, so as not to further increase anxiety.  She requested education material about which IT chemo she would be receiving.  She was given handouts on IT methotrexate today from [email or web address].  She will require continued support & education if/when IT methotrexate is recommended.   Hiccups:  -Possibly secondary to steroids and chemotherapy causing diaphragm spasms.  -E-scribed Baclofen 10 mg BIDprn.        Dispo:  -Return to cancer center in 3 weeks for cycle #6 R-CHOP.    All questions were answered to patient's stated satisfaction. Encouraged patient to call with any new concerns or questions before her next visit to the cancer center and we can certain see her sooner, if needed.    Plan of care discussed with Dr. Talbert Cage, who agrees with the above aforementioned.     Orders placed this encounter:  No orders of the defined types were placed in this encounter.     Mike Craze, NP [place] [phone]

## 2017-02-21 ENCOUNTER — Encounter (HOSPITAL_COMMUNITY): Payer: Self-pay | Admitting: Adult Health

## 2017-02-21 ENCOUNTER — Encounter (HOSPITAL_COMMUNITY): Payer: BLUE CROSS/BLUE SHIELD | Attending: Oncology

## 2017-02-21 ENCOUNTER — Encounter (HOSPITAL_COMMUNITY): Payer: BLUE CROSS/BLUE SHIELD | Attending: Hematology & Oncology | Admitting: Adult Health

## 2017-02-21 VITALS — BP 107/58 | HR 69 | Temp 97.7°F | Resp 18 | Wt 117.6 lb

## 2017-02-21 DIAGNOSIS — Z87891 Personal history of nicotine dependence: Secondary | ICD-10-CM | POA: Insufficient documentation

## 2017-02-21 DIAGNOSIS — Z5111 Encounter for antineoplastic chemotherapy: Secondary | ICD-10-CM

## 2017-02-21 DIAGNOSIS — Z5112 Encounter for antineoplastic immunotherapy: Secondary | ICD-10-CM

## 2017-02-21 DIAGNOSIS — R066 Hiccough: Secondary | ICD-10-CM | POA: Diagnosis not present

## 2017-02-21 DIAGNOSIS — B1920 Unspecified viral hepatitis C without hepatic coma: Secondary | ICD-10-CM | POA: Insufficient documentation

## 2017-02-21 DIAGNOSIS — J45909 Unspecified asthma, uncomplicated: Secondary | ICD-10-CM | POA: Insufficient documentation

## 2017-02-21 DIAGNOSIS — C8338 Diffuse large B-cell lymphoma, lymph nodes of multiple sites: Secondary | ICD-10-CM

## 2017-02-21 DIAGNOSIS — F419 Anxiety disorder, unspecified: Secondary | ICD-10-CM | POA: Insufficient documentation

## 2017-02-21 DIAGNOSIS — C8330 Diffuse large B-cell lymphoma, unspecified site: Secondary | ICD-10-CM | POA: Insufficient documentation

## 2017-02-21 DIAGNOSIS — J90 Pleural effusion, not elsewhere classified: Secondary | ICD-10-CM | POA: Insufficient documentation

## 2017-02-21 LAB — CBC WITH DIFFERENTIAL/PLATELET
Basophils Absolute: 0.1 10*3/uL (ref 0.0–0.1)
Basophils Relative: 1 %
EOS ABS: 0 10*3/uL (ref 0.0–0.7)
EOS PCT: 1 %
HCT: 29.9 % — ABNORMAL LOW (ref 36.0–46.0)
Hemoglobin: 10.4 g/dL — ABNORMAL LOW (ref 12.0–15.0)
Lymphocytes Relative: 6 %
Lymphs Abs: 0.3 10*3/uL — ABNORMAL LOW (ref 0.7–4.0)
MCH: 34 pg (ref 26.0–34.0)
MCHC: 34.8 g/dL (ref 30.0–36.0)
MCV: 97.7 fL (ref 78.0–100.0)
MONO ABS: 0.8 10*3/uL (ref 0.1–1.0)
Monocytes Relative: 15 %
Neutro Abs: 4.2 10*3/uL (ref 1.7–7.7)
Neutrophils Relative %: 77 %
PLATELETS: 267 10*3/uL (ref 150–400)
RBC: 3.06 MIL/uL — AB (ref 3.87–5.11)
RDW: 18.2 % — AB (ref 11.5–15.5)
WBC: 5.4 10*3/uL (ref 4.0–10.5)

## 2017-02-21 LAB — COMPREHENSIVE METABOLIC PANEL
ALT: 12 U/L — ABNORMAL LOW (ref 14–54)
ANION GAP: 6 (ref 5–15)
AST: 22 U/L (ref 15–41)
Albumin: 4.2 g/dL (ref 3.5–5.0)
Alkaline Phosphatase: 46 U/L (ref 38–126)
BUN: 13 mg/dL (ref 6–20)
CHLORIDE: 102 mmol/L (ref 101–111)
CO2: 28 mmol/L (ref 22–32)
Calcium: 9.1 mg/dL (ref 8.9–10.3)
Creatinine, Ser: 0.53 mg/dL (ref 0.44–1.00)
GFR calc Af Amer: >60 mL/min (ref >60)
GLUCOSE: 111 mg/dL — AB (ref 65–99)
POTASSIUM: 4 mmol/L (ref 3.5–5.1)
SODIUM: 136 mmol/L (ref 135–145)
Total Bilirubin: 0.6 mg/dL (ref 0.3–1.2)
Total Protein: 6.9 g/dL (ref 6.5–8.1)

## 2017-02-21 MED ORDER — DIPHENHYDRAMINE HCL 25 MG PO CAPS: 50.0000 mg | ORAL_CAPSULE | Freq: Once | ORAL | Status: DC

## 2017-02-21 MED ORDER — ACETAMINOPHEN 325 MG PO TABS: 650.0000 mg | ORAL_TABLET | Freq: Once | ORAL | Status: DC

## 2017-02-21 MED ORDER — SODIUM CHLORIDE 0.9 % IV SOLN
750.0000 mg/m2 | Freq: Once | INTRAVENOUS | Status: AC
  Administered 2017-02-21: 1180 mg via INTRAVENOUS
  Filled 2017-02-21: qty 50

## 2017-02-21 MED ORDER — PEGFILGRASTIM 6 MG/0.6ML ~~LOC~~ PSKT
6.0000 mg | PREFILLED_SYRINGE | Freq: Once | SUBCUTANEOUS | Status: AC
  Administered 2017-02-21: 6 mg via SUBCUTANEOUS
  Filled 2017-02-21: qty 0.6

## 2017-02-21 MED ORDER — SODIUM CHLORIDE 0.9 % IV SOLN
375.0000 mg/m2 | Freq: Once | INTRAVENOUS | Status: AC
  Administered 2017-02-21: 600 mg via INTRAVENOUS
  Filled 2017-02-21: qty 50

## 2017-02-21 MED ORDER — HEPARIN SOD (PORK) LOCK FLUSH 100 UNIT/ML IV SOLN
500.0000 [IU] | Freq: Once | INTRAVENOUS | Status: AC | PRN
  Administered 2017-02-21: 500 [IU]
  Filled 2017-02-21: qty 5

## 2017-02-21 MED ORDER — DOXORUBICIN HCL CHEMO IV INJECTION 2 MG/ML
50.0000 mg/m2 | Freq: Once | INTRAVENOUS | Status: AC
  Administered 2017-02-21: 78 mg via INTRAVENOUS
  Filled 2017-02-21: qty 39

## 2017-02-21 MED ORDER — SODIUM CHLORIDE 0.9% FLUSH
10.0000 mL | INTRAVENOUS | Status: DC | PRN
  Administered 2017-02-21: 10 mL
  Filled 2017-02-21: qty 10

## 2017-02-21 MED ORDER — DEXAMETHASONE SODIUM PHOSPHATE 10 MG/ML IJ SOLN
10.0000 mg | Freq: Once | INTRAMUSCULAR | Status: AC
  Administered 2017-02-21: 10 mg via INTRAVENOUS
  Filled 2017-02-21: qty 1

## 2017-02-21 MED ORDER — BACLOFEN 10 MG PO TABS: ORAL_TABLET | ORAL | 0 refills | Status: DC

## 2017-02-21 MED ORDER — SODIUM CHLORIDE 0.9 % IV SOLN: 10.0000 mg | Freq: Once | INTRAVENOUS | Status: DC

## 2017-02-21 MED ORDER — VINCRISTINE SULFATE CHEMO INJECTION 1 MG/ML
2.0000 mg | Freq: Once | INTRAVENOUS | Status: AC
  Administered 2017-02-21: 2 mg via INTRAVENOUS
  Filled 2017-02-21: qty 2

## 2017-02-21 MED ORDER — PALONOSETRON HCL INJECTION 0.25 MG/5ML
0.2500 mg | Freq: Once | INTRAVENOUS | Status: AC
  Administered 2017-02-21: 0.25 mg via INTRAVENOUS
  Filled 2017-02-21: qty 5

## 2017-02-21 MED ORDER — SODIUM CHLORIDE 0.9 % IV SOLN
Freq: Once | INTRAVENOUS | Status: AC
  Administered 2017-02-21: 10:00:00 via INTRAVENOUS

## 2017-02-21 NOTE — Progress Notes (Signed)
Labs reviewed with MD, proceed with treatment.  Chemotherapy given today per orders. Patient tolerated it well. Monica KitchenWatt Climes Gray arrived today for ONPRO neulasta on body injector. See MAR for administration details. Injector in place and engaged with green light indicator on flashing. Tolerated application with out problems.  Vitals stable and discharged home from clinic ambulatory. Follow up as scheduled.

## 2017-02-21 NOTE — Patient Instructions (Addendum)
[place] at [facility] Discharge Instructions  RECOMMENDATIONS MADE BY THE CONSULTANT AND ANY TEST RESULTS WILL BE SENT TO YOUR REFERRING PHYSICIAN.  You were seen today by Mike Craze NP. [place] BID PRN for hiccups sent to your pharmacy. Return in 3 weeks for follow up and next cycle.    Thank you for choosing [facility] at [facility] to provide your oncology and hematology care.  To afford each patient quality time with our provider, please arrive at least 15 minutes before your scheduled appointment time.    If you have a lab appointment with the [facility] please come in thru the  Main Entrance and check in at the main information desk  You need to re-schedule your appointment should you arrive 10 or more minutes late.  We strive to give you quality time with our providers, and arriving late affects you and other patients whose appointments are after yours.  Also, if you no show three or more times for appointments you may be dismissed from the clinic at the providers discretion.     Again, thank you for choosing [facility].  Our hope is that these requests will decrease the amount of time that you wait before being seen by our physicians.       _____________________________________________________________  Should you have questions after your visit to [facility], please contact our office at [phone] between the hours of 8:30 a.m. and 4:30 p.m.  Voicemails left after 4:30 p.m. will not be returned until the following business day.  For prescription refill requests, have your pharmacy contact our office.       Resources For Cancer Patients and their Caregivers ? American Cancer Society: Can assist with transportation, wigs, general needs, runs Look Good Feel Better.        860-026-7601 ? Cancer Care: Provides financial assistance, online support groups, medication/co-pay assistance.   1-800-813-HOPE 864 454 8143) ? [facility] Assists [place] cancer patients and their families through emotional , educational and financial support.  9075615227 ? [place] DSS Where to apply for food stamps, Medicaid and utility assistance. 864-819-3636 ? RCATS: Transportation to medical appointments. 712-829-2182 ? Social Security Administration: May apply for disability if have a Stage IV cancer. 317-777-3142 (709)111-4110 ? [employer], Disability and Transit Services: Assists with nutrition, care and transit needs. [facility] Support Programs: @10RELATIVEDAYS @ > Cancer Support Group  2nd Tuesday of the month 1pm-2pm, Journey Room  > Creative Journey  3rd Tuesday of the month 1130am-1pm, Journey Room  > Look Good Feel Better  1st Wednesday of the month 10am-12 noon, Journey Room (Call [facility] to register [phone])

## 2017-02-21 NOTE — Patient Instructions (Signed)
[facility] Discharge Instructions for Patients Receiving Chemotherapy   Beginning January 23rd 2017 lab work for the Cancer Center will be done in the  Main lab at [facility] on 1st floor. If you have a lab appointment with the Cancer Center please come in thru the  Main Entrance and check in at the main information desk   Today you received the following chemotherapy agents   To help prevent nausea and vomiting after your treatment, we encourage you to take your nausea medication     If you develop nausea and vomiting, or diarrhea that is not controlled by your medication, call the clinic.  The clinic phone number is [phone]. Office hours are Monday-Friday 8:30am-5:00pm.  BELOW ARE SYMPTOMS THAT SHOULD BE REPORTED IMMEDIATELY:  *FEVER GREATER THAN 101.0 F  *CHILLS WITH OR WITHOUT FEVER  NAUSEA AND VOMITING THAT IS NOT CONTROLLED WITH YOUR NAUSEA MEDICATION  *UNUSUAL SHORTNESS OF BREATH  *UNUSUAL BRUISING OR BLEEDING  TENDERNESS IN MOUTH AND THROAT WITH OR WITHOUT PRESENCE OF ULCERS  *URINARY PROBLEMS  *BOWEL PROBLEMS  UNUSUAL RASH Items with * indicate a potential emergency and should be followed up as soon as possible. If you have an emergency after office hours please contact your primary care physician or go to the nearest emergency department.  Please call the clinic during office hours if you have any questions or concerns.   You may also contact the Patient Navigator at [phone] should you have any questions or need assistance in obtaining follow up care.      Resources For Cancer Patients and their Caregivers ? American Cancer Society: Can assist with transportation, wigs, general needs, runs Look Good Feel Better.        1-888-227-6333 ? Cancer Care: Provides financial assistance, online support groups, medication/co-pay assistance.  1-800-813-HOPE (4673) ? [facility] Assists [place] cancer  patients and their families through emotional , educational and financial support.  336-427-4357 ? [place] DSS Where to apply for food stamps, Medicaid and utility assistance. 336-342-1394 ? RCATS: Transportation to medical appointments. 336-347-2287 ? Social Security Administration: May apply for disability if have a Stage IV cancer. 336-342-7796 1-800-772-1213 ? [place] [facility], Disability and Transit Services: Assists with nutrition, care and transit needs. 336-349-2343         

## 2017-03-14 ENCOUNTER — Encounter (HOSPITAL_BASED_OUTPATIENT_CLINIC_OR_DEPARTMENT_OTHER): Payer: BLUE CROSS/BLUE SHIELD | Admitting: Oncology

## 2017-03-14 ENCOUNTER — Encounter (HOSPITAL_COMMUNITY): Payer: Self-pay

## 2017-03-14 ENCOUNTER — Encounter (HOSPITAL_BASED_OUTPATIENT_CLINIC_OR_DEPARTMENT_OTHER): Payer: BLUE CROSS/BLUE SHIELD

## 2017-03-14 VITALS — BP 121/56 | HR 72 | Temp 98.2°F | Resp 18 | Wt 117.6 lb

## 2017-03-14 DIAGNOSIS — Z5111 Encounter for antineoplastic chemotherapy: Secondary | ICD-10-CM

## 2017-03-14 DIAGNOSIS — C8338 Diffuse large B-cell lymphoma, lymph nodes of multiple sites: Secondary | ICD-10-CM

## 2017-03-14 DIAGNOSIS — Z87891 Personal history of nicotine dependence: Secondary | ICD-10-CM | POA: Diagnosis not present

## 2017-03-14 DIAGNOSIS — C8330 Diffuse large B-cell lymphoma, unspecified site: Secondary | ICD-10-CM

## 2017-03-14 DIAGNOSIS — K769 Liver disease, unspecified: Secondary | ICD-10-CM | POA: Diagnosis not present

## 2017-03-14 DIAGNOSIS — J45909 Unspecified asthma, uncomplicated: Secondary | ICD-10-CM | POA: Diagnosis not present

## 2017-03-14 DIAGNOSIS — Z5112 Encounter for antineoplastic immunotherapy: Secondary | ICD-10-CM

## 2017-03-14 DIAGNOSIS — J90 Pleural effusion, not elsewhere classified: Secondary | ICD-10-CM | POA: Diagnosis not present

## 2017-03-14 DIAGNOSIS — F419 Anxiety disorder, unspecified: Secondary | ICD-10-CM | POA: Diagnosis not present

## 2017-03-14 DIAGNOSIS — B1920 Unspecified viral hepatitis C without hepatic coma: Secondary | ICD-10-CM | POA: Diagnosis not present

## 2017-03-14 LAB — CBC WITH DIFFERENTIAL/PLATELET
BASOS PCT: 1 %
Basophils Absolute: 0.1 10*3/uL (ref 0.0–0.1)
Eosinophils Absolute: 0 10*3/uL (ref 0.0–0.7)
Eosinophils Relative: 0 %
HEMATOCRIT: 25.8 % — AB (ref 36.0–46.0)
Hemoglobin: 8.9 g/dL — ABNORMAL LOW (ref 12.0–15.0)
Lymphocytes Relative: 5 %
Lymphs Abs: 0.3 10*3/uL — ABNORMAL LOW (ref 0.7–4.0)
MCH: 34.5 pg — ABNORMAL HIGH (ref 26.0–34.0)
MCHC: 34.5 g/dL (ref 30.0–36.0)
MCV: 100 fL (ref 78.0–100.0)
MONO ABS: 0.2 10*3/uL (ref 0.1–1.0)
Monocytes Relative: 4 %
NEUTROS ABS: 4.7 10*3/uL (ref 1.7–7.7)
Neutrophils Relative %: 90 %
PLATELETS: 229 10*3/uL (ref 150–400)
RBC: 2.58 MIL/uL — ABNORMAL LOW (ref 3.87–5.11)
RDW: 14.9 % (ref 11.5–15.5)
WBC: 5.2 10*3/uL (ref 4.0–10.5)

## 2017-03-14 LAB — COMPREHENSIVE METABOLIC PANEL
ALBUMIN: 3.6 g/dL (ref 3.5–5.0)
ALT: 12 U/L — ABNORMAL LOW (ref 14–54)
AST: 21 U/L (ref 15–41)
Alkaline Phosphatase: 39 U/L (ref 38–126)
Anion gap: 7 (ref 5–15)
BUN: 12 mg/dL (ref 6–20)
CHLORIDE: 105 mmol/L (ref 101–111)
CO2: 24 mmol/L (ref 22–32)
Calcium: 8.1 mg/dL — ABNORMAL LOW (ref 8.9–10.3)
Creatinine, Ser: 0.49 mg/dL (ref 0.44–1.00)
GFR calc Af Amer: >60 mL/min (ref >60)
GFR calc non Af Amer: >60 mL/min (ref >60)
GLUCOSE: 107 mg/dL — AB (ref 65–99)
POTASSIUM: 3.2 mmol/L — AB (ref 3.5–5.1)
Sodium: 136 mmol/L (ref 135–145)
Total Bilirubin: 0.4 mg/dL (ref 0.3–1.2)
Total Protein: 5.8 g/dL — ABNORMAL LOW (ref 6.5–8.1)

## 2017-03-14 MED ORDER — POTASSIUM CHLORIDE CRYS ER 20 MEQ PO TBCR
EXTENDED_RELEASE_TABLET | ORAL | Status: AC
  Filled 2017-03-14: qty 3

## 2017-03-14 MED ORDER — DEXAMETHASONE SODIUM PHOSPHATE 10 MG/ML IJ SOLN
10.0000 mg | Freq: Once | INTRAMUSCULAR | Status: AC
  Administered 2017-03-14: 10 mg via INTRAVENOUS
  Filled 2017-03-14: qty 1

## 2017-03-14 MED ORDER — DIPHENHYDRAMINE HCL 25 MG PO CAPS
50.0000 mg | ORAL_CAPSULE | Freq: Once | ORAL | Status: DC
  Filled 2017-03-14: qty 2

## 2017-03-14 MED ORDER — PALONOSETRON HCL INJECTION 0.25 MG/5ML
0.2500 mg | Freq: Once | INTRAVENOUS | Status: AC
  Administered 2017-03-14: 0.25 mg via INTRAVENOUS
  Filled 2017-03-14: qty 5

## 2017-03-14 MED ORDER — HEPARIN SOD (PORK) LOCK FLUSH 100 UNIT/ML IV SOLN
500.0000 [IU] | Freq: Once | INTRAVENOUS | Status: AC | PRN
  Administered 2017-03-14: 500 [IU]

## 2017-03-14 MED ORDER — ACETAMINOPHEN 325 MG PO TABS
650.0000 mg | ORAL_TABLET | Freq: Once | ORAL | Status: DC
  Filled 2017-03-14: qty 2

## 2017-03-14 MED ORDER — SODIUM CHLORIDE 0.9 % IV SOLN
750.0000 mg/m2 | Freq: Once | INTRAVENOUS | Status: AC
  Administered 2017-03-14: 1180 mg via INTRAVENOUS
  Filled 2017-03-14: qty 9

## 2017-03-14 MED ORDER — POTASSIUM CHLORIDE CRYS ER 20 MEQ PO TBCR
60.0000 meq | EXTENDED_RELEASE_TABLET | Freq: Two times a day (BID) | ORAL | Status: DC
  Administered 2017-03-14: 60 meq via ORAL

## 2017-03-14 MED ORDER — PEGFILGRASTIM 6 MG/0.6ML ~~LOC~~ PSKT
6.0000 mg | PREFILLED_SYRINGE | Freq: Once | SUBCUTANEOUS | Status: AC
  Administered 2017-03-14: 6 mg via SUBCUTANEOUS
  Filled 2017-03-14: qty 0.6

## 2017-03-14 MED ORDER — SODIUM CHLORIDE 0.9% FLUSH
10.0000 mL | INTRAVENOUS | Status: DC | PRN
  Administered 2017-03-14: 10 mL
  Filled 2017-03-14: qty 10

## 2017-03-14 MED ORDER — SODIUM CHLORIDE 0.9 % IV SOLN
Freq: Once | INTRAVENOUS | Status: AC
  Administered 2017-03-14: 10:00:00 via INTRAVENOUS

## 2017-03-14 MED ORDER — VINCRISTINE SULFATE CHEMO INJECTION 1 MG/ML
2.0000 mg | Freq: Once | INTRAVENOUS | Status: AC
  Administered 2017-03-14: 2 mg via INTRAVENOUS
  Filled 2017-03-14: qty 2

## 2017-03-14 MED ORDER — SODIUM CHLORIDE 0.9 % IV SOLN
375.0000 mg/m2 | Freq: Once | INTRAVENOUS | Status: AC
  Administered 2017-03-14: 600 mg via INTRAVENOUS
  Filled 2017-03-14: qty 10

## 2017-03-14 MED ORDER — SODIUM CHLORIDE 0.9 % IV SOLN: 10.0000 mg | Freq: Once | INTRAVENOUS | Status: DC

## 2017-03-14 MED ORDER — DOXORUBICIN HCL CHEMO IV INJECTION 2 MG/ML
50.0000 mg/m2 | Freq: Once | INTRAVENOUS | Status: AC
  Administered 2017-03-14: 78 mg via INTRAVENOUS
  Filled 2017-03-14: qty 39

## 2017-03-14 NOTE — Progress Notes (Signed)
Chemotherapy given today per orders. Patient tolerated it well. No problems. Marland KitchenWatt Climes Ensminger arrived today for ONPRO neulasta on body injector. See MAR for administration details. Injector in place and engaged with green light indicator on flashing. Tolerated application with out problems. Vitals stable and discharged home from clinic ambulatory. Follow up as scheduled.

## 2017-03-14 NOTE — Patient Instructions (Addendum)
[place] at [facility] Discharge Instructions  RECOMMENDATIONS MADE BY THE CONSULTANT AND ANY TEST RESULTS WILL BE SENT TO YOUR REFERRING PHYSICIAN.  You were seen today by Dr. Twana First We will schedule a PET scan and lab work in about 6 weeks Follow up after to review results   Thank you for choosing [place] at [facility] to provide your oncology and hematology care.  To afford each patient quality time with our provider, please arrive at least 15 minutes before your scheduled appointment time.    If you have a lab appointment with the [place] please come in thru the  Main Entrance and check in at the main information desk  You need to re-schedule your appointment should you arrive 10 or more minutes late.  We strive to give you quality time with our providers, and arriving late affects you and other patients whose appointments are after yours.  Also, if you no show three or more times for appointments you may be dismissed from the clinic at the providers discretion.     Again, thank you for choosing [facility].  Our hope is that these requests will decrease the amount of time that you wait before being seen by our physicians.       _____________________________________________________________  Should you have questions after your visit to [facility], please contact our office at [phone] between the hours of 8:30 a.m. and 4:30 p.m.  Voicemails left after 4:30 p.m. will not be returned until the following business day.  For prescription refill requests, have your pharmacy contact our office.       Resources For Cancer Patients and their Caregivers ? American Cancer Society: Can assist with transportation, wigs, general needs, runs Look Good Feel Better.        ([phone] ? Cancer Care: Provides financial assistance, online support groups, medication/co-pay assistance.  1-800-813-HOPE  346-105-8012) ? [facility] Assists [place] cancer patients and their families through emotional , educational and financial support.  (919) 466-4827 ? [place] DSS Where to apply for food stamps, Medicaid and utility assistance. 857-262-0133 ? RCATS: Transportation to medical appointments. 281-840-6278 ? Social Security Administration: May apply for disability if have a Stage IV cancer. 551-576-3213 339-523-0164 ? [employer], Disability and Transit Services: Assists with nutrition, care and transit needs. [place] Support Programs: @10RELATIVEDAYS @ > Cancer Support Group  2nd Tuesday of the month 1pm-2pm, Journey Room  > Creative Journey  3rd Tuesday of the month 1130am-1pm, Journey Room  > Look Good Feel Better  1st Wednesday of the month 10am-12 noon, Journey Room (Call [place] to register [phone])

## 2017-03-14 NOTE — Progress Notes (Signed)
[facility], [place] [address]  DLBCL  PROGRESS NOTE  CURRENT THERAPY: R-CHOP beginning on 11/29/2016.    Diffuse large B cell lymphoma ([place])   09/25/2016 Imaging    CT neck- 1. Large right level 5A a nodal mass measuring up to 2.9 cm. This is suggestive of metastatic disease from an unknown primary. Histologic sampling is recommended. 2. Pleural masses of the medial posterior right chest and anterior lateral left chest. The larger mass, adjacent to the right aspect of the T3 and T4 vertebral bodies, extends into the T2-T3 and T3-T4 neural foramina without definite extension into the spinal canal. These masses are also favored to be metastases. A primary pleural malignancy such as mesothelioma is also a consideration.      10/03/2016 Procedure    Needle core biopsy of right cervical lymph node by IR.      10/04/2016 Pathology Results    Interpretation Tissue-Flow Cytometry - INSUFFICIENT CELLS FOR ANALYSIS.      10/08/2016 Pathology Results    Diagnosis Lymph node, needle/core biopsy, right cervical - ATYPICAL LYMPHOID PROLIFERATION, SUSPICIOUS FOR NON-HODGKIN B-CELL LYMPHOMA.      10/31/2016 Procedure    Excisional lymph node biopsy by Dr. Dalbert Batman      11/02/2016 Pathology Results    Interpretation Tissue-Flow Cytometry - B-CELL POPULATION WITH KAPPA LIGHT CHAIN EXCESS.      11/02/2016 Pathology Results    Lymph node, biopsy, Deep Cervical - FOLLICULAR AND DIFFUSE LARGE B-CELL LYMPHOMA.      11/09/2016 PET scan    1. Active lymphoma within the neck, chest, abdomen, and pelvis, as detailed above. 2. Medial right breast hypermetabolic nodule is suspicious for a synchronous breast primary. Differential considerations include breast lymphoma or a hypermetabolic benign lesion. Consider diagnostic right-sided mammogram and ultrasound. If the patient is diagnosed with right breast cancer, hypermetabolic small right axillary node would  be indeterminate for lymphoma versus metastasis. 3. Right larger than left pleural effusions. 4.  Aortic atherosclerosis. 5. Hypermetabolic liver lesion is favored to represent lymphoma.      11/09/2016 Imaging    CT CAP- 1. Extensive adenopathy in the chest, abdomen and pelvis, bilateral pleural nodularity/masses, omental nodularity and renal lesions, most consistent with lymphoma. 2. Bilateral pleural effusions, large on the right and moderate on the left. 3. Lucent lesion in the L1 vertebral body, indeterminate but well-circumscribed and possibly benign. Attention on followup exams is warranted. 4. Aortic atherosclerosis (ICD10-170.0). Coronary artery calcification. 5. Right adrenal adenoma      11/14/2016 Procedure    Bone marrow aspiration and biopsy by IR      11/16/2016 Pathology Results    Bone Marrow, Aspirate,Biopsy, and Clot - SLIGHTLY HYPERCELLULAR BONE MARROW FOR AGE WITH TRILINEAGE HEMATOPOIESIS. - SEVERAL LYMPHOID AGGREGATES PRESENT. - SEE COMMENT PERIPHERAL BLOOD: - NO SIGNIFICANT MORPHOLOGIC ABNORMALITIES. Comment: Lower grade lymphoproliferative process cannot be entirely excluded especially given the paratrabecular location of some of the lymphoid aggregates, the overall findings are very limited and not considered disgnostic. There is no marrow involvement by large cell lymphoma.      11/16/2016 Procedure    BREAST BIOPSY      11/16/2016 Pathology Results    Breast, right, needle core biopsy, 2:30 o'clock, 3 cm from nipple - NON HODGKIN'S B CELL LYMPHOMA. - SEE ONCOLOGY TABLE. 2. Lymph node, needle/core biopsy, right axilla - NON HODGKIN'S B CELL LYMPHOMA. - SEE ONCOLOGY TABLE. Microscopic Comment 1. LYMPHOMA  11/20/2016 Echocardiogram    Left ventricle: The cavity size was normal. Wall thickness was normal. Systolic function was normal. The estimated ejection fraction was in the range of 60% to 65%. Doppler parameters are consistent  with abnormal left ventricular relaxation (grade 1 diastolic dysfunction). Doppler parameters are consistent with high ventricular filling pressure.      11/20/2016 Procedure    Successful ultrasound guided right thoracentesis yielding 800 mL of pleural fluid.      11/20/2016 Pathology Results    PLEURAL FLUID, RIGHT - REACTIVE MESOTHELIAL CELLS AND SMALL LYMPHOID CELLS PRESENT. - SEE COMMENT. [name] MD Pathologist, Electronic Signature (Case signed 11/22/2016)      11/29/2016 -  Chemotherapy    The patient had DOXOrubicin (ADRIAMYCIN) chemo injection 78 mg, 50 mg/m2 = 78 mg, Intravenous,  Once, 3 of 6 cycles  palonosetron (ALOXI) injection 0.25 mg, 0.25 mg, Intravenous,  Once, 3 of 6 cycles  pegfilgrastim (NEULASTA) injection 6 mg, 6 mg, Subcutaneous,  Once, 1 of 1 cycle  pegfilgrastim (NEULASTA ONPRO KIT) injection 6 mg, 6 mg, Subcutaneous, Once, 2 of 5 cycles  vinCRIStine (ONCOVIN) 2 mg in sodium chloride 0.9 % 50 mL chemo infusion, 2 mg, Intravenous,  Once, 3 of 6 cycles  riTUXimab (RITUXAN) 600 mg in sodium chloride 0.9 % 250 mL (1.9355 mg/mL) chemo infusion, 375 mg/m2 = 600 mg, Intravenous,  Once, 3 of 6 cycles  cyclophosphamide (CYTOXAN) 1,180 mg in sodium chloride 0.9 % 250 mL chemo infusion, 750 mg/m2 = 1,180 mg, Intravenous,  Once, 3 of 6 cycles  for chemotherapy treatment.        01/28/2017 PET scan    1. Marked improvement with essential resolution of the hypermetabolic activity in the adenopathy of the neck, chest, abdomen, and pelvis, and marked reduced size of associated lymph nodes. A right inguinal lymph node is currently Deauville 2. Most of the prior lymph nodes are Deauville 1. 2. The left posterior nasopharyngeal activity has also resolved. 3. Moderately hypermetabolic small right thyroid nodule with maximum SUV 11.9. A significant minority of such hypermetabolic thyroid nodules can be malignant, and thyroid ultrasound should be considered for  further characterization. 4. Other imaging findings of potential clinical significance: Trace residual right pleural effusion. Old granulomatous disease. Aortic arch and abdominal aortoiliac atherosclerotic vascular disease.      INTERVAL HISTORY: Monica Gray 66 y.o. 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  Of note, breast lesion biopsy proven to be DLBCL.  Beginning R-CHOP with curative intent on 11/29/2016.  NEGATIVE bone marrow aspiration and biopsy for involvement of DLBCL.  She is doing well today. She has her last cycle 6 of R-CHOP today. Most of her hair is gone now. She can still feel a nodule in her right groin but it is small. She is very tired and is still suffering from dry mouth. Her nose has been running most of the time. She does not have allergies. The numbness and tingling in her hands and feet have gotten worse and lasted longer after the last cycle. She can open jars, button her shirts, and walk without tripping. Denies falls, chest pain, SOB, abdominal pain, diarrhea, or any other concerns.   Review of Systems  Constitutional: Positive for malaise/fatigue.       Hair loss  HENT:       Dry mouth Rhinorrhea  Eyes: Negative.   Respiratory: Negative.  Negative for shortness  of breath.   Cardiovascular: Negative.  Negative for chest pain.  Gastrointestinal: Negative for abdominal pain and diarrhea.       Abdominal lump from tumor, small  Genitourinary: Negative.   Musculoskeletal: Negative.  Negative for falls.  Skin: Negative.   Neurological: Positive for tingling (hands and feet).  Endo/Heme/Allergies: Negative.   Psychiatric/Behavioral: Negative.   All other systems reviewed and are negative. 14 point review of systems was performed and is negative except as detailed under history of present illness and above  Past Medical History:  Diagnosis Date  .  Allergic to cats   . Anxiety   . Arthritis    hands  . Diffuse large B cell lymphoma ([place]) 10/31/2016  . Dyspnea    with exertion - intermittent, per pt.; no O2  . History of asthma    as a child  . History of hepatitis C    states was cured in 2016 after [facility]  . Immature cataract   . Sensitive skin   . Wears dentures    upper; lower denture attaches to 4 dental implants, per pt.    Past Surgical History:  Procedure Laterality Date  . BONE MARROW ASPIRATION Right 11/14/2016   iliac  . BREAST BIOPSY Right   . CHEST TUBE INSERTION     1987, 1988  . LAPAROSCOPIC UNILATERAL SALPINGO OOPHERECTOMY     ectopic pregnancy  . LYMPH NODE BIOPSY Right 10/31/2016   Procedure: EXCISION DEEP RIGHT CERVICAL LYMPH NODES;  Surgeon: Fanny Skates, MD;  Location: [facility];  Service: General;  Laterality: Right;  . PORTACATH PLACEMENT Left 11/21/2016   Procedure: INSERTION PORT-A-CATH WITH [place];  Surgeon: Fanny Skates, MD;  Location: [facility];  Service: General;  Laterality: Left;    Family History  Problem Relation Age of Onset  . Leukemia Mother   . Emphysema Father     Social History   Social History  . Marital status: Married    Spouse name: N/A  . Number of children: N/A  . Years of education: N/A   Social History Main Topics  . Smoking status: Former Smoker    Quit date: 1984  . Smokeless tobacco: Never Used  . Alcohol use No  . Drug use: No  . Sexual activity: Not Asked   Other Topics Concern  . None   Social History Narrative  . None     PHYSICAL EXAMINATION ECOG PERFORMANCE STATUS: 1 - Symptomatic but completely ambulatory Physical Exam  Constitutional: She is oriented to person, place, and time and well-developed, well-nourished, and in no distress.  Physical exam was performed in treatment bed.  HENT:  Head: Normocephalic and atraumatic.  Eyes: Conjunctivae and EOM are normal. Pupils are equal, round, and reactive to light.  Neck: Normal  range of motion. Neck supple.  Cardiovascular: Normal rate, regular rhythm and normal heart sounds.   Pulmonary/Chest: Effort normal and breath sounds normal.  Abdominal: Soft. Bowel sounds are normal.  Small 8 mm right inguinal nodule.   Musculoskeletal: Normal range of motion.  Neurological: She is alert and oriented to person, place, and time. Gait normal.  Skin: Skin is warm and dry.  Nursing note and vitals reviewed.  LABORATORY DATA: CBC    Component Value Date/Time   WBC 5.4 02/21/2017 0840   RBC 3.06 (L) 02/21/2017 0840   HGB 10.4 (L) 02/21/2017 0840   HCT 29.9 (L) 02/21/2017 0840   PLT 267 02/21/2017 0840   MCV 97.7 02/21/2017 0840  MCH 34.0 02/21/2017 0840   MCHC 34.8 02/21/2017 0840   RDW 18.2 (H) 02/21/2017 0840   LYMPHSABS 0.3 (L) 02/21/2017 0840   MONOABS 0.8 02/21/2017 0840   EOSABS 0.0 02/21/2017 0840   BASOSABS 0.1 02/21/2017 0840     Chemistry      Component Value Date/Time   NA 136 02/21/2017 0840   K 4.0 02/21/2017 0840   CL 102 02/21/2017 0840   CO2 28 02/21/2017 0840   BUN 13 02/21/2017 0840   CREATININE 0.53 02/21/2017 0840      Component Value Date/Time   CALCIUM 9.1 02/21/2017 0840   ALKPHOS 46 02/21/2017 0840   AST 22 02/21/2017 0840   ALT 12 (L) 02/21/2017 0840   BILITOT 0.6 02/21/2017 0840     RADIOGRAPHIC STUDIES: I have personally reviewed the radiological images as listed and agreed with the findings in the report.  NUCLEAR MEDICINE PET SKULL BASE TO THIGH 01/28/2017 IMPRESSION: 1. Marked improvement with essential resolution of the hypermetabolic activity in the adenopathy of the neck, chest, abdomen, and pelvis, and marked reduced size of associated lymph nodes. A right inguinal lymph node is currently Deauville 2. Most of the prior lymph nodes are Deauville 1. 2. The left posterior nasopharyngeal activity has also resolved. 3. Moderately hypermetabolic small right thyroid nodule with maximum SUV 11.9. A significant minority  of such hypermetabolic thyroid nodules can be malignant, and thyroid ultrasound should be considered for further characterization. 4. Other imaging findings of potential clinical significance: Trace residual right pleural effusion. Old granulomatous disease. Aortic arch and abdominal aortoiliac atherosclerotic vascular disease.  PATHOLOGY:  ASSESSMENT AND PLAN:  Stage IV DLBCL arising from follicular lymphoma with PET suspicious for hypermetabolic liver lesion (not biopsy proven).  Of note, breast lesion biopsy proven to be DLBCL.  Beginning R-CHOP with curative intent on 11/29/2016.  NEGATIVE bone marrow aspiration and biopsy for involvement of DLBCL.  PLAN: Proceed with planned chemotherapy with cycle 6 of R-CHOP today. She is tolerating chemotherapy well.  Repeat restaging PET/CT in 6 weeks.   Plan to initiate intrathecal methotrexate for CNS prophylaxis within 4 weeks, I will make the arrangements with IR.  She will return for follow up in 6 weeks to review her scans.   All questions were answered. The patient knows to call the clinic with any problems, questions or concerns. We can certainly see the patient much sooner if necessary.  This document serves as a record of services personally performed by Twana First, MD. It was created on her behalf by Martinique Casey, a trained medical scribe. The creation of this record is based on the scribe's personal observations and the provider's statements to them. This document has been checked and approved by the attending provider.  I have reviewed the above documentation for accuracy and completeness and I agree with the above.  This note is electronically signed by: Martinique M Casey 03/14/2017 9:14 AM

## 2017-03-14 NOTE — Patient Instructions (Signed)
[facility] Discharge Instructions for Patients Receiving Chemotherapy   Beginning January 23rd 2017 lab work for the Cancer Center will be done in the  Main lab at [facility] on 1st floor. If you have a lab appointment with the Cancer Center please come in thru the  Main Entrance and check in at the main information desk   Today you received the following chemotherapy agents   To help prevent nausea and vomiting after your treatment, we encourage you to take your nausea medication     If you develop nausea and vomiting, or diarrhea that is not controlled by your medication, call the clinic.  The clinic phone number is [phone]. Office hours are Monday-Friday 8:30am-5:00pm.  BELOW ARE SYMPTOMS THAT SHOULD BE REPORTED IMMEDIATELY:  *FEVER GREATER THAN 101.0 F  *CHILLS WITH OR WITHOUT FEVER  NAUSEA AND VOMITING THAT IS NOT CONTROLLED WITH YOUR NAUSEA MEDICATION  *UNUSUAL SHORTNESS OF BREATH  *UNUSUAL BRUISING OR BLEEDING  TENDERNESS IN MOUTH AND THROAT WITH OR WITHOUT PRESENCE OF ULCERS  *URINARY PROBLEMS  *BOWEL PROBLEMS  UNUSUAL RASH Items with * indicate a potential emergency and should be followed up as soon as possible. If you have an emergency after office hours please contact your primary care physician or go to the nearest emergency department.  Please call the clinic during office hours if you have any questions or concerns.   You may also contact the Patient Navigator at [phone] should you have any questions or need assistance in obtaining follow up care.      Resources For Cancer Patients and their Caregivers ? American Cancer Society: Can assist with transportation, wigs, general needs, runs Look Good Feel Better.        1-888-227-6333 ? Cancer Care: Provides financial assistance, online support groups, medication/co-pay assistance.  1-800-813-HOPE (4673) ? [facility] Assists [place] cancer  patients and their families through emotional , educational and financial support.  336-427-4357 ? [place] DSS Where to apply for food stamps, Medicaid and utility assistance. 336-342-1394 ? RCATS: Transportation to medical appointments. 336-347-2287 ? Social Security Administration: May apply for disability if have a Stage IV cancer. [phone] 1-800-772-1213 ? [place] [employer], Disability and Transit Services: Assists with nutrition, care and transit needs. [phone]         

## 2017-03-17 ENCOUNTER — Other Ambulatory Visit (HOSPITAL_COMMUNITY): Payer: Self-pay | Admitting: Hematology & Oncology

## 2017-03-17 DIAGNOSIS — C8338 Diffuse large B-cell lymphoma, lymph nodes of multiple sites: Secondary | ICD-10-CM

## 2017-03-18 ENCOUNTER — Other Ambulatory Visit (HOSPITAL_COMMUNITY): Payer: Self-pay | Admitting: Oncology

## 2017-03-18 DIAGNOSIS — C8330 Diffuse large B-cell lymphoma, unspecified site: Secondary | ICD-10-CM

## 2017-03-18 MED ORDER — SODIUM CHLORIDE 0.9 % IJ SOLN: 12.0000 mg | INTRAMUSCULAR | Status: DC

## 2017-03-19 ENCOUNTER — Other Ambulatory Visit (HOSPITAL_COMMUNITY): Payer: Self-pay | Admitting: Oncology

## 2017-03-19 DIAGNOSIS — C8330 Diffuse large B-cell lymphoma, unspecified site: Secondary | ICD-10-CM

## 2017-03-20 ENCOUNTER — Telehealth (HOSPITAL_COMMUNITY): Payer: Self-pay | Admitting: Emergency Medicine

## 2017-03-20 NOTE — Telephone Encounter (Signed)
Notified pt of first IT methotrexate appt will be on 04/03/2017 and she will arrive at 9:45 am.  Pt verbalized understanding.  Amy will call her with the other appt once they are scheduled.

## 2017-03-21 ENCOUNTER — Other Ambulatory Visit (HOSPITAL_COMMUNITY): Payer: Self-pay | Admitting: Emergency Medicine

## 2017-03-21 MED ORDER — ALPRAZOLAM 0.25 MG PO TABS: ORAL_TABLET | ORAL | 0 refills | Status: DC

## 2017-03-21 NOTE — Progress Notes (Signed)
Pt called and wanted to know if she could get a refill on her Xanax.  She said it helps calm her down when she is really anxious, she is still taking her lexapro like its prescribed.  She is also having lots of anxiety about the upcoming appts for IT methotreaxate and if all the doctors will be in network.  She has tried to call several people about this.

## 2017-03-27 IMAGING — CT CT ABD-PEL WO/W CM
3 of 8 series · 13 of 46 positions shown, 14 images · IV contrast (iopamidol)
Comparison: CT neck 09/24/2016.

ADDENDUM:
9 mm medial right breast nodule, subsequently evaluated on PET.
CLINICAL DATA: Pleural mass.

EXAM:
CT CHEST WITH CONTRAST
CT ABDOMEN AND PELVIS WITH AND WITHOUT CONTRAST
TECHNIQUE: Multidetector CT imaging of the chest was performed during
intravenous contrast administration. Multidetector CT imaging of the
abdomen and pelvis was performed following the standard protocol
before and during bolus administration of intravenous contrast.
CONTRAST:  75mL PZ1O5O-1CC IOPAMIDOL (PZ1O5O-1CC) INJECTION 61%

[Series 4: cap with · axial · 0.62mm/px · z∈[-473,-38]mm · 8 of 113 slices shown, 9 images]
[im 13/113  soft-tissue]
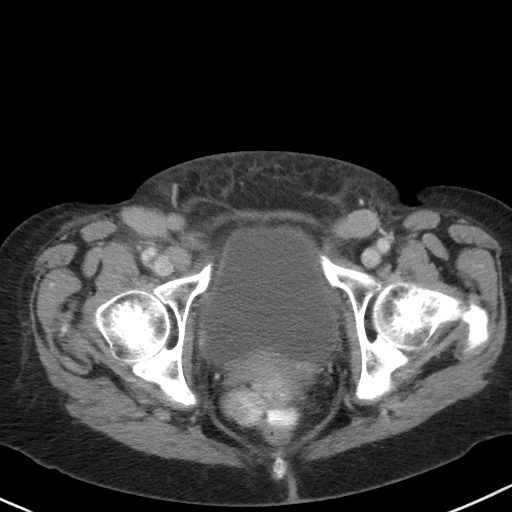
[im 13/113  bone]
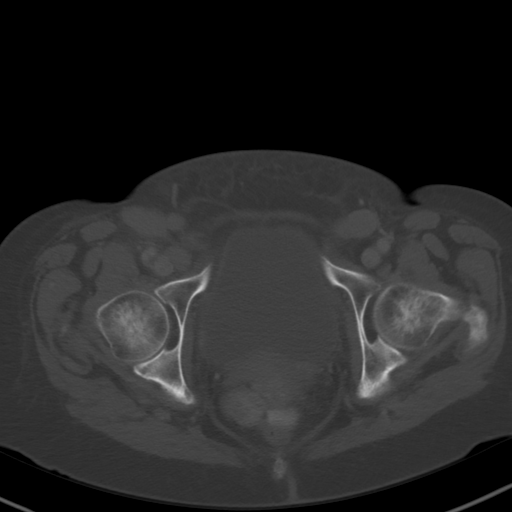
[im 25/113  soft-tissue]
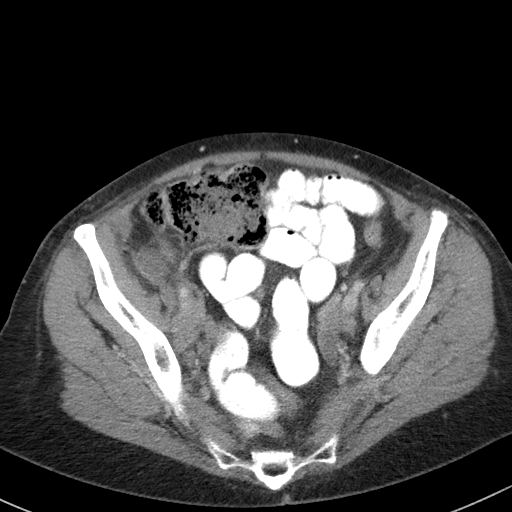
[im 38/113  soft-tissue]
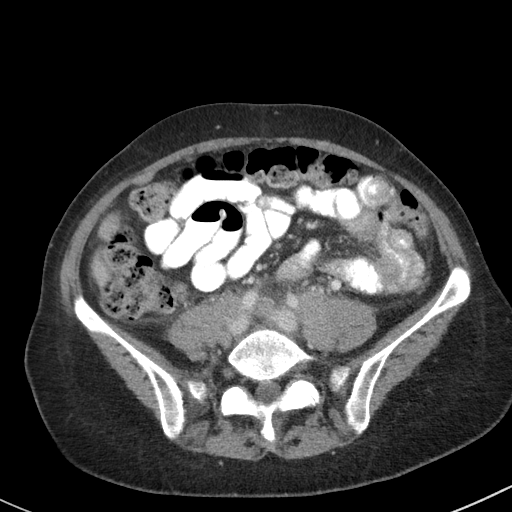
[im 50/113  soft-tissue]
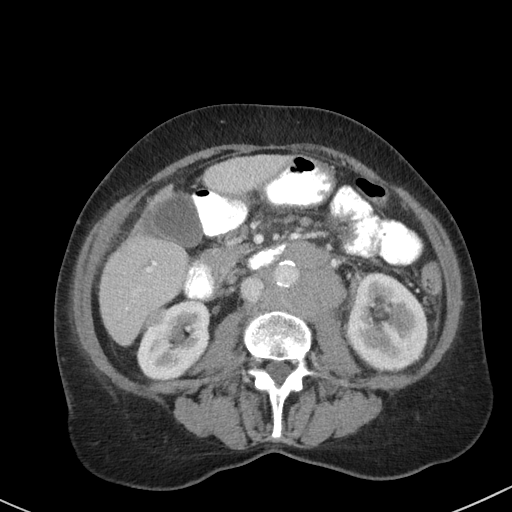
[im 63/113  soft-tissue]
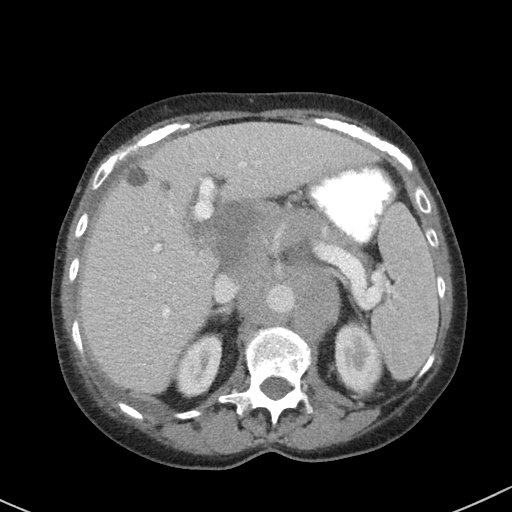
[im 75/113  soft-tissue]
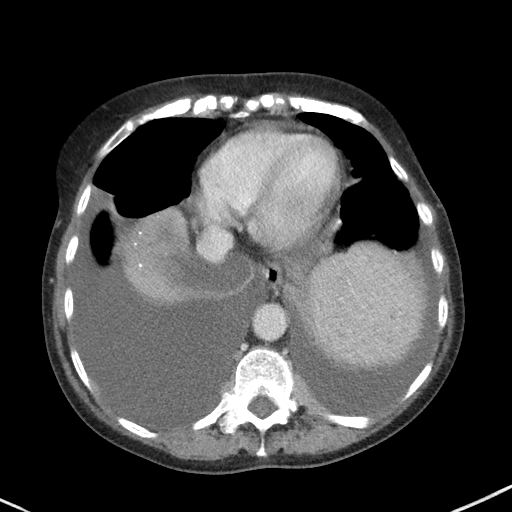
[im 88/113  soft-tissue]
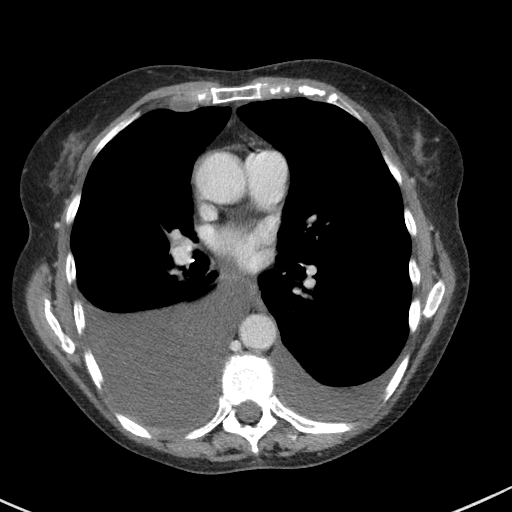
[im 100/113  soft-tissue]
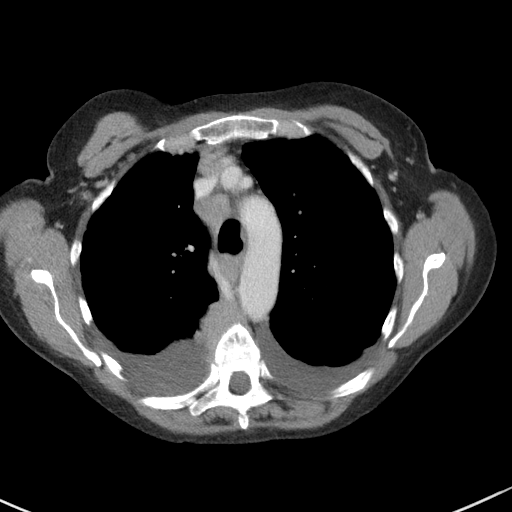

[Series 5: lung · axial · 0.60mm/px · z∈[-205,-181]mm · 2 of 128 slices shown]
[im 12/128  bone]
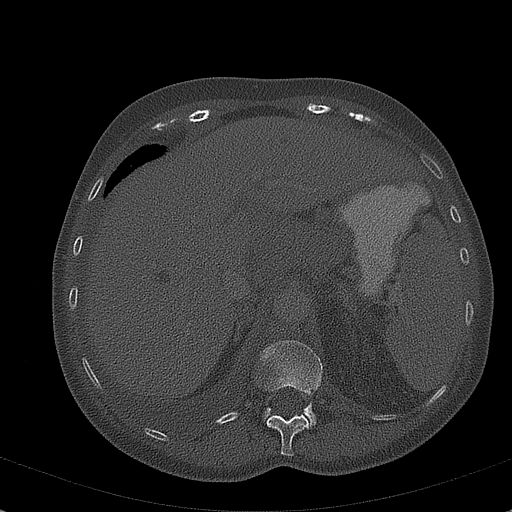
[im 24/128  bone]
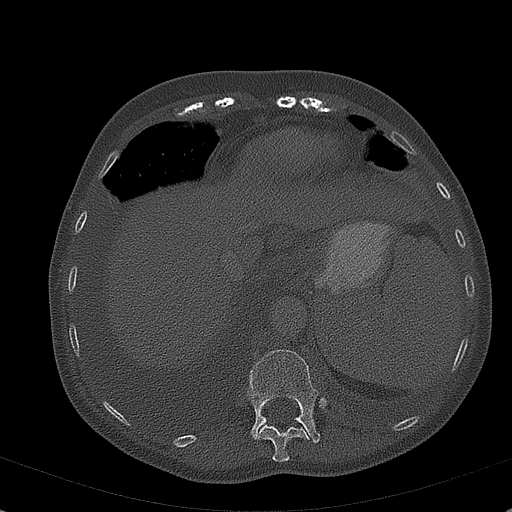

[Series 10: coronals · coronal · 0.57mm/px · 3 of 137 slices shown]
[im 28/137  soft-tissue]
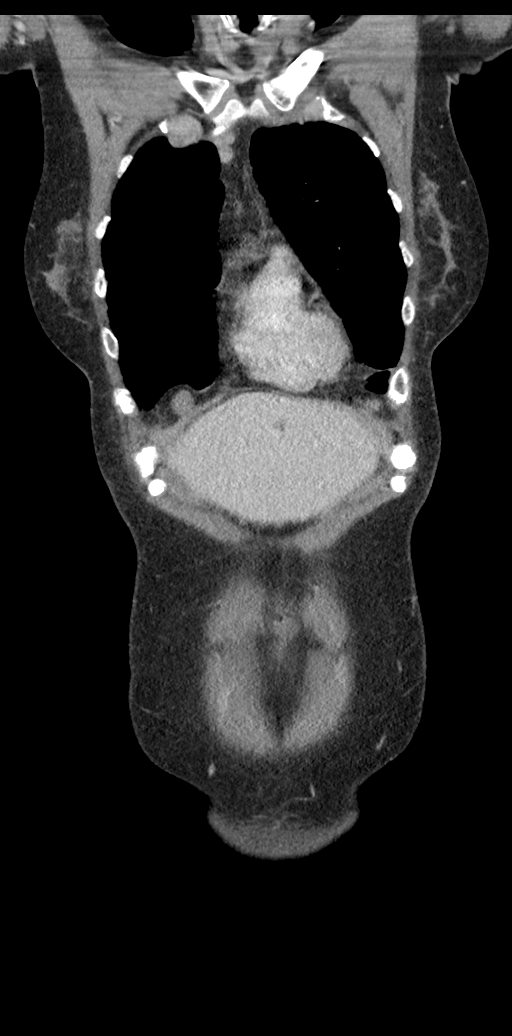
[im 55/137  soft-tissue]
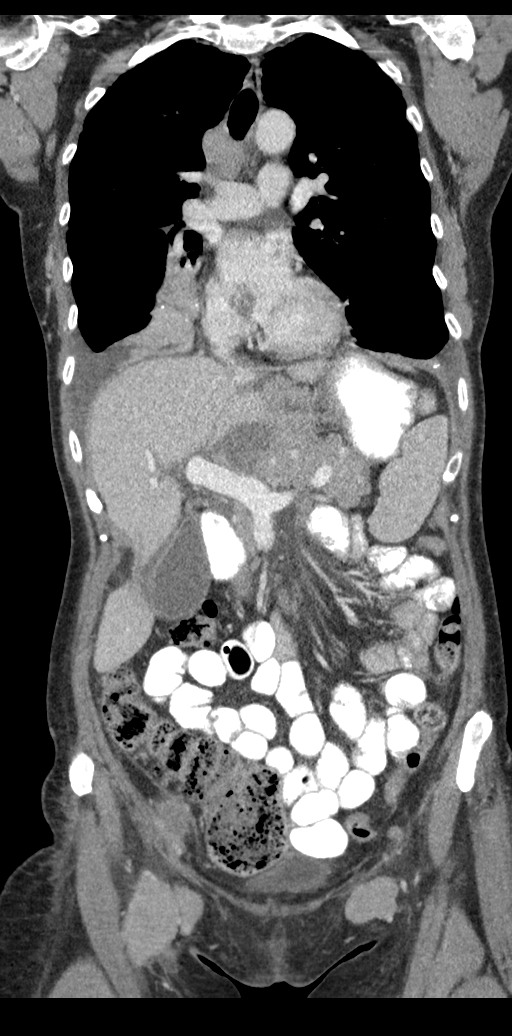
[im 82/137  soft-tissue]
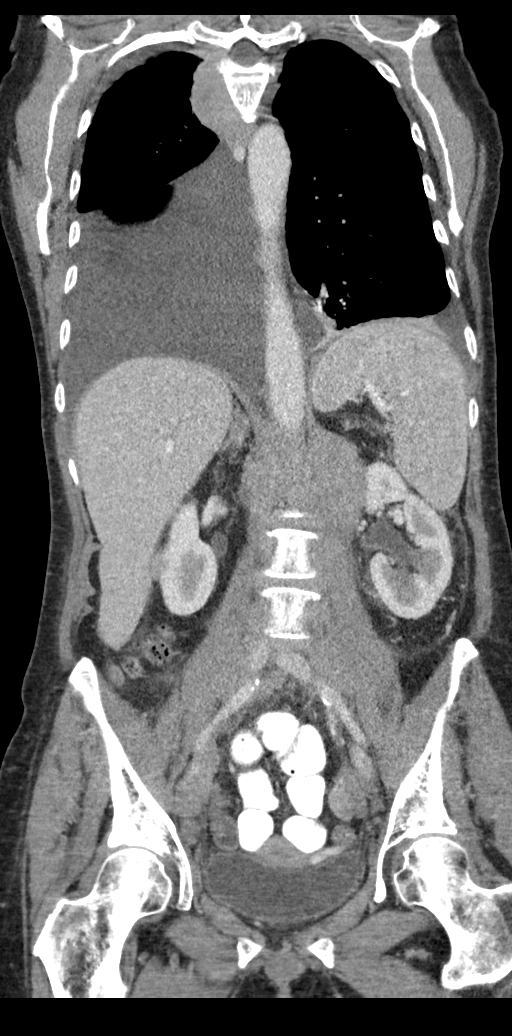

[13 of 46 positions shown; findings below may reference images not displayed]

FINDINGS: CT CHEST FINDINGS

Cardiovascular: Atherosclerotic calcification of the arterial
vasculature, including coronary arteries. Heart size normal. No
pericardial effusion.

Mediastinum/Nodes: Low neck and mediastinal lymph nodes measure up
to 2.2 cm in the low right paratracheal station. Right hilar lymph
nodes measure up to approximately 8 mm. Subcarinal lymph node, 10
mm. No axillary or subpectoral adenopathy. Pre pericardiac and juxta
diaphragmatic lymph nodes measure up to 11 mm on the right.
Esophagus is grossly unremarkable.

Lungs/Pleura: Bilateral pleural nodularity and masses measure up to
2.2 x 5.3 cm in the medial upper right hemithorax, at the T3-4
level. There is associated neural foraminal extension (series 4,
image 8). Large right and moderate left pleural effusions. Biapical
pleuroparenchymal scarring. Compressive atelectasis in the right
middle lobe and both lower lobes. A 7 x 11 mm nodule seen along the
distal bronchus intermedius (series 5, image 57) and may be pleural
in location, exerting mass effect on the airway. A few scattered
millimetric pulmonary nodules in the left lung are noted. Airway is
otherwise unremarkable.

Musculoskeletal: A lucent lesion is seen in the left lateral aspect
of the L1 vertebral body, measuring 11 mm. No additional worrisome
lytic or sclerotic lesions.

CT ABDOMEN AND PELVIS FINDINGS

Hepatobiliary: Low-attenuation lesions in the liver measure up to
1.4 cm and are difficult to further characterize due to size.
Largest lesion does not show enhancement, favoring a cyst. Liver
measures 17.9 cm, at the upper limits of normal. Minimal
pericholecystic fluid. No biliary ductal dilatation.

Pancreas: Negative.

Spleen: Negative.

Adrenals/Urinary Tract: 1.5 cm fluid density right adrenal nodule is
noted. Left adrenal gland is within normal limits. Enhancing lesions
along the margins of both kidneys measure up to 1.5 cm along the
anterolateral margin of the inferior right kidney (series 4, image
64). Ureters are decompressed. Bladder is grossly unremarkable.

Stomach/Bowel: Stomach, small bowel and colon are unremarkable.

Vascular/Lymphatic: Atherosclerotic calcification of the arterial
vasculature without abdominal aortic aneurysm. Gastrohepatic
ligament adenopathy measures up to 2.4 x 3.5 cm. Necrotic-appearing
porta hepatis adenopathy measures up to 2.5 x 2.8 cm. Conglomerate
retroperitoneal adenopathy encases the abdominal aorta and renal
arteries, measuring 4.1 x 6.7 cm, with a craniocaudal extent of
approximately 13.9 cm. Adenopathy extends along both iliac chains
bilaterally, some of which appears necrotic. Index left external
iliac lymph node measures 2.3 x 4.0 cm. Bilateral inguinal
adenopathy measures up to 2.6 x 4.3 cm on the right. Borderline
enlarged mesenteric lymph nodes.

Reproductive: Uterus is visualized.  No adnexal mass.

Other: No free fluid. An omental nodule in the right lower quadrant
measures 1.9 cm (series 4, image 77).

Musculoskeletal: No worrisome lytic or sclerotic lesions. L1 lucent
lesion is again noted.
IMPRESSION: 1. Extensive adenopathy in the chest, abdomen and pelvis, bilateral
pleural nodularity/masses, omental nodularity and renal lesions,
most consistent with lymphoma.
2. Bilateral pleural effusions, large on the right and moderate on
the left.
3. Lucent lesion in the L1 vertebral body, indeterminate but
well-circumscribed and possibly benign. Attention on followup exams
is warranted.
4. Aortic atherosclerosis (KIV60-170.0). Coronary artery
calcification.
5. Right adrenal adenoma.

## 2017-03-28 ENCOUNTER — Other Ambulatory Visit (HOSPITAL_COMMUNITY): Payer: Self-pay | Admitting: Hematology & Oncology

## 2017-04-03 ENCOUNTER — Ambulatory Visit (HOSPITAL_COMMUNITY)
Admission: RE | Admit: 2017-04-03 | Discharge: 2017-04-03 | Disposition: A | Discharge status: Home / Self Care | Payer: BLUE CROSS/BLUE SHIELD | Source: Ambulatory Visit | Attending: Oncology | Admitting: Oncology

## 2017-04-03 ENCOUNTER — Other Ambulatory Visit: Payer: Self-pay | Admitting: Diagnostic Radiology

## 2017-04-03 ENCOUNTER — Other Ambulatory Visit (HOSPITAL_COMMUNITY): Payer: Self-pay | Admitting: Pharmacist

## 2017-04-03 ENCOUNTER — Encounter (HOSPITAL_COMMUNITY): Payer: Self-pay

## 2017-04-03 ENCOUNTER — Other Ambulatory Visit (HOSPITAL_COMMUNITY): Payer: Self-pay | Admitting: Oncology

## 2017-04-03 DIAGNOSIS — Z9889 Other specified postprocedural states: Secondary | ICD-10-CM | POA: Insufficient documentation

## 2017-04-03 DIAGNOSIS — C8330 Diffuse large B-cell lymphoma, unspecified site: Secondary | ICD-10-CM | POA: Insufficient documentation

## 2017-04-03 DIAGNOSIS — Z5111 Encounter for antineoplastic chemotherapy: Secondary | ICD-10-CM | POA: Insufficient documentation

## 2017-04-03 MED ORDER — SODIUM CHLORIDE 0.9 % IJ SOLN
INTRAMUSCULAR | Status: DC
  Administered 2017-04-03: 11:00:00 via INTRATHECAL
  Filled 2017-04-03: qty 0.48

## 2017-04-03 NOTE — Progress Notes (Signed)
Incontinent of large amt of yellow urine. States "I am unable to use the bedpan." perineum cleansed. Dry pad applied.

## 2017-04-03 NOTE — Progress Notes (Signed)
Patient arrived to short stay from radiology lying flat on stretcher. Alert and oriented. Vital signs stable. Husband at bedside. Will continue to monitor.

## 2017-04-03 NOTE — Discharge Instructions (Signed)
Lumbar Puncture, Care After °Refer to this sheet in the next few weeks. These instructions provide you with information on caring for yourself after your procedure. Your health care provider may also give you more specific instructions. Your treatment has been planned according to current medical practices, but problems sometimes occur. Call your health care provider if you have any problems or questions after your procedure. °What can I expect after the procedure? °After your procedure, it is typical to have the following sensations: °· Mild discomfort or pain at the insertion site. °· Mild headache that is relieved with pain medicines. ° °Follow these instructions at home: ° °· Avoid lifting anything heavier than 10 lb (4.5 kg) for at least 12 hours after the procedure. °· Drink enough fluids to keep your urine clear or pale yellow. °Contact a health care provider if: °· You have fever or chills. °· You have nausea or vomiting. °· You have a headache that lasts for more than 2 days. °Get help right away if: °· You have any numbness or tingling in your legs. °· You are unable to control your bowel or bladder. °· You have bleeding or swelling in your back at the insertion site. °· You are dizzy or faint. °This information is not intended to replace advice given to you by your health care provider. Make sure you discuss any questions you have with your health care provider. °Document Released: 11/10/2013 Document Revised: 04/12/2016 Document Reviewed: 07/14/2013 °Elsevier Interactive Patient Education © 2017 Elsevier Inc. ° °

## 2017-04-03 NOTE — Progress Notes (Signed)
Awake. Denies pain. Band-aid to back dry/intact. Denies numbness and tingling lower extremities. D/C instructions given. Voiced understanding. Assisted with drsg. D/C to home via w/c in good condition.

## 2017-04-22 ENCOUNTER — Other Ambulatory Visit (HOSPITAL_COMMUNITY): Payer: Self-pay | Admitting: Emergency Medicine

## 2017-04-22 MED ORDER — POLYETHYLENE GLYCOL 3350 17 GM/SCOOP PO POWD: ORAL | 0 refills | Status: DC

## 2017-04-22 NOTE — Progress Notes (Signed)
Monica Gray called to verify the times of her IT chemo on Wednesday and wanted to know if I would call her in some miralax for her constipation.

## 2017-04-24 ENCOUNTER — Other Ambulatory Visit (HOSPITAL_COMMUNITY): Payer: Self-pay | Admitting: Emergency Medicine

## 2017-04-24 ENCOUNTER — Ambulatory Visit (HOSPITAL_COMMUNITY)
Admission: RE | Admit: 2017-04-24 | Discharge: 2017-04-24 | Disposition: A | Discharge status: Home / Self Care | Payer: BLUE CROSS/BLUE SHIELD | Source: Ambulatory Visit | Attending: Oncology | Admitting: Oncology

## 2017-04-24 ENCOUNTER — Other Ambulatory Visit (HOSPITAL_COMMUNITY): Payer: Self-pay | Admitting: Pharmacist

## 2017-04-24 DIAGNOSIS — C8330 Diffuse large B-cell lymphoma, unspecified site: Secondary | ICD-10-CM | POA: Insufficient documentation

## 2017-04-24 MED ORDER — HYDROCORTISONE SOD SUCCINATE 100 MG PF FOR IT USE
INTRAMUSCULAR | Status: DC
Start: 2017-04-24 — End: 2017-04-25
  Administered 2017-04-24: 12:00:00 via INTRATHECAL
  Filled 2017-04-24: qty 0.48

## 2017-04-24 MED ORDER — METHOTREXATE SODIUM CHEMO INJECTION (PF) 50 MG/2ML
INTRAMUSCULAR | Status: DC
  Filled 2017-04-24: qty 0.48

## 2017-04-24 MED ORDER — OXYCODONE HCL 5 MG PO TABS: 5.0000 mg | ORAL_TABLET | ORAL | 0 refills | Status: DC | PRN

## 2017-04-24 NOTE — Progress Notes (Signed)
Patient discharged to home with instructions.  Lumbar site clean dry and band aid intact.  Denies pain or complaints of any kind.

## 2017-04-24 NOTE — Discharge Instructions (Signed)
°  Instructions below discussed with husband Shanon Brow and patient.  Both verbalized understanding and copy signed and sent to medical records. Renda Rolls, RN    Lumbar Puncture, Care After Refer to this sheet in the next few weeks. These instructions provide you with information on caring for yourself after your procedure. Your health care provider may also give you more specific instructions. Your treatment has been planned according to current medical practices, but problems sometimes occur. Call your health care provider if you have any problems or questions after your procedure. What can I expect after the procedure? After your procedure, it is typical to have the following sensations:  Mild discomfort or pain at the insertion site.  Mild headache that is relieved with pain medicines.  Follow these instructions at home:   Avoid lifting anything heavier than 10 lb (4.5 kg) for at least 12 hours after the procedure.  Drink enough fluids to keep your urine clear or pale yellow. Contact a health care provider if:  You have fever or chills.  You have nausea or vomiting.  You have a headache that lasts for more than 2 days. Get help right away if:  You have any numbness or tingling in your legs.  You are unable to control your bowel or bladder.  You have bleeding or swelling in your back at the insertion site.  You are dizzy or faint. This information is not intended to replace advice given to you by your health care provider. Make sure you discuss any questions you have with your health care provider. Document Released: 11/10/2013 Document Revised: 04/12/2016 Document Reviewed: 07/14/2013 Elsevier Interactive Patient Education  2017 [place] FLAT AS MUCH AS POSSIBLE FOR THE REMAINDER OF THE DAY.

## 2017-04-24 NOTE — Procedures (Signed)
Preprocedure Dx: DLBCL Postprocedure Dx: DLBCL Procedure:  Fluoroscopically guided lumbar puncture with intrathecal chemotherapy injection Radiologist:  Thornton Papas Anesthesia:  2 ml of 1% lidocaine Specimen:  84ml CSF, pink but clearing consistent with traumatic tap EBL:   < 1 ml Chemo:  12 mg methotexate intrathecal Complications: None

## 2017-04-29 ENCOUNTER — Ambulatory Visit (HOSPITAL_COMMUNITY)
Admission: RE | Admit: 2017-04-29 | Discharge: 2017-04-29 | Disposition: A | Discharge status: Home / Self Care | Payer: BLUE CROSS/BLUE SHIELD | Source: Ambulatory Visit | Attending: Oncology | Admitting: Oncology

## 2017-04-29 DIAGNOSIS — J439 Emphysema, unspecified: Secondary | ICD-10-CM | POA: Insufficient documentation

## 2017-04-29 DIAGNOSIS — I2510 Atherosclerotic heart disease of native coronary artery without angina pectoris: Secondary | ICD-10-CM | POA: Diagnosis not present

## 2017-04-29 DIAGNOSIS — C8330 Diffuse large B-cell lymphoma, unspecified site: Secondary | ICD-10-CM | POA: Diagnosis present

## 2017-04-29 DIAGNOSIS — E079 Disorder of thyroid, unspecified: Secondary | ICD-10-CM | POA: Insufficient documentation

## 2017-04-29 DIAGNOSIS — R590 Localized enlarged lymph nodes: Secondary | ICD-10-CM | POA: Diagnosis not present

## 2017-04-29 DIAGNOSIS — I700 Atherosclerosis of aorta: Secondary | ICD-10-CM | POA: Insufficient documentation

## 2017-04-29 LAB — GLUCOSE, CAPILLARY: Glucose-Capillary: 104 mg/dL — ABNORMAL HIGH (ref 65–99)

## 2017-04-29 MED ORDER — FLUDEOXYGLUCOSE F - 18 (FDG) INJECTION
5.8700 | Freq: Once | INTRAVENOUS | Status: AC | PRN
  Administered 2017-04-29: 5.87 via INTRAVENOUS

## 2017-04-30 ENCOUNTER — Telehealth (HOSPITAL_COMMUNITY): Payer: Self-pay | Admitting: Oncology

## 2017-04-30 NOTE — Telephone Encounter (Signed)
CHECKED FOR OUTSTANDING BAL FOR RITUXAN AND NEULASTA. PT HAS 0 BAL

## 2017-05-01 ENCOUNTER — Other Ambulatory Visit (HOSPITAL_COMMUNITY): Payer: Self-pay | Admitting: Oncology

## 2017-05-01 DIAGNOSIS — C8330 Diffuse large B-cell lymphoma, unspecified site: Secondary | ICD-10-CM

## 2017-05-02 ENCOUNTER — Encounter (HOSPITAL_BASED_OUTPATIENT_CLINIC_OR_DEPARTMENT_OTHER): Payer: BLUE CROSS/BLUE SHIELD | Admitting: Oncology

## 2017-05-02 ENCOUNTER — Encounter (HOSPITAL_COMMUNITY): Payer: Self-pay | Admitting: Oncology

## 2017-05-02 ENCOUNTER — Ambulatory Visit (HOSPITAL_COMMUNITY): Payer: BLUE CROSS/BLUE SHIELD

## 2017-05-02 ENCOUNTER — Other Ambulatory Visit (HOSPITAL_COMMUNITY): Payer: BLUE CROSS/BLUE SHIELD

## 2017-05-02 ENCOUNTER — Encounter (HOSPITAL_COMMUNITY): Payer: BLUE CROSS/BLUE SHIELD | Attending: Adult Health

## 2017-05-02 VITALS — BP 152/82 | HR 83 | Resp 16 | Ht 63.0 in | Wt 119.0 lb

## 2017-05-02 DIAGNOSIS — C8338 Diffuse large B-cell lymphoma, lymph nodes of multiple sites: Secondary | ICD-10-CM

## 2017-05-02 DIAGNOSIS — C8330 Diffuse large B-cell lymphoma, unspecified site: Secondary | ICD-10-CM

## 2017-05-02 DIAGNOSIS — F419 Anxiety disorder, unspecified: Secondary | ICD-10-CM | POA: Diagnosis not present

## 2017-05-02 DIAGNOSIS — Z87891 Personal history of nicotine dependence: Secondary | ICD-10-CM | POA: Insufficient documentation

## 2017-05-02 LAB — CBC WITH DIFFERENTIAL/PLATELET
Basophils Absolute: 0 10*3/uL (ref 0.0–0.1)
Basophils Relative: 2 %
Eosinophils Absolute: 0.1 10*3/uL (ref 0.0–0.7)
Eosinophils Relative: 7 %
HCT: 33 % — ABNORMAL LOW (ref 36.0–46.0)
Hemoglobin: 11.4 g/dL — ABNORMAL LOW (ref 12.0–15.0)
Lymphocytes Relative: 27 %
Lymphs Abs: 0.5 10*3/uL — ABNORMAL LOW (ref 0.7–4.0)
MCH: 33.5 pg (ref 26.0–34.0)
MCHC: 34.5 g/dL (ref 30.0–36.0)
MCV: 97.1 fL (ref 78.0–100.0)
MONOS PCT: 16 %
Monocytes Absolute: 0.3 10*3/uL (ref 0.1–1.0)
NEUTROS PCT: 49 %
Neutro Abs: 0.9 10*3/uL — ABNORMAL LOW (ref 1.7–7.7)
PLATELETS: 130 10*3/uL — AB (ref 150–400)
RBC: 3.4 MIL/uL — ABNORMAL LOW (ref 3.87–5.11)
RDW: 12.5 % (ref 11.5–15.5)
WBC: 1.9 10*3/uL — AB (ref 4.0–10.5)

## 2017-05-02 LAB — COMPREHENSIVE METABOLIC PANEL
ALT: 20 U/L (ref 14–54)
ANION GAP: 9 (ref 5–15)
AST: 28 U/L (ref 15–41)
Albumin: 4.4 g/dL (ref 3.5–5.0)
Alkaline Phosphatase: 55 U/L (ref 38–126)
BUN: 17 mg/dL (ref 6–20)
CHLORIDE: 100 mmol/L — AB (ref 101–111)
CO2: 25 mmol/L (ref 22–32)
CREATININE: 0.52 mg/dL (ref 0.44–1.00)
Calcium: 9.3 mg/dL (ref 8.9–10.3)
GFR calc non Af Amer: >60 mL/min (ref >60)
Glucose, Bld: 108 mg/dL — ABNORMAL HIGH (ref 65–99)
POTASSIUM: 4 mmol/L (ref 3.5–5.1)
SODIUM: 134 mmol/L — AB (ref 135–145)
Total Bilirubin: 0.6 mg/dL (ref 0.3–1.2)
Total Protein: 7.4 g/dL (ref 6.5–8.1)

## 2017-05-02 LAB — LACTATE DEHYDROGENASE: LDH: 187 U/L (ref 98–192)

## 2017-05-02 LAB — TSH: TSH: 0.749 u[IU]/mL (ref 0.350–4.500)

## 2017-05-02 MED ORDER — SODIUM CHLORIDE 0.9% FLUSH
20.0000 mL | INTRAVENOUS | Status: DC | PRN
  Administered 2017-05-02: 20 mL via INTRAVENOUS
  Filled 2017-05-02: qty 20

## 2017-05-02 MED ORDER — ALLOPURINOL 300 MG PO TABS: ORAL_TABLET | ORAL | 2 refills | Status: DC

## 2017-05-02 MED ORDER — HEPARIN SOD (PORK) LOCK FLUSH 100 UNIT/ML IV SOLN
INTRAVENOUS | Status: AC
  Filled 2017-05-02: qty 5

## 2017-05-02 MED ORDER — HEPARIN SOD (PORK) LOCK FLUSH 100 UNIT/ML IV SOLN
500.0000 [IU] | Freq: Once | INTRAVENOUS | Status: AC
  Administered 2017-05-02: 500 [IU] via INTRAVENOUS

## 2017-05-02 NOTE — Patient Instructions (Signed)
[place] at [facility] Discharge Instructions  RECOMMENDATIONS MADE BY THE CONSULTANT AND ANY TEST RESULTS WILL BE SENT TO YOUR REFERRING PHYSICIAN.  Labs drawn from port then flushed per protocol today. Follow-up as scheduled. Call clinic for any questions or concerns  Thank you for choosing [place] at [facility] to provide your oncology and hematology care.  To afford each patient quality time with our provider, please arrive at least 15 minutes before your scheduled appointment time.    If you have a lab appointment with the [facility] please come in thru the  Main Entrance and check in at the main information desk  You need to re-schedule your appointment should you arrive 10 or more minutes late.  We strive to give you quality time with our providers, and arriving late affects you and other patients whose appointments are after yours.  Also, if you no show three or more times for appointments you may be dismissed from the clinic at the providers discretion.     Again, thank you for choosing [facility].  Our hope is that these requests will decrease the amount of time that you wait before being seen by our physicians.       _____________________________________________________________  Should you have questions after your visit to [facility], please contact our office at [phone] between the hours of 8:30 a.m. and 4:30 p.m.  Voicemails left after 4:30 p.m. will not be returned until the following business day.  For prescription refill requests, have your pharmacy contact our office.       Resources For Cancer Patients and their Caregivers ? American Cancer Society: Can assist with transportation, wigs, general needs, runs Look Good Feel Better.        909-592-2247 ? Cancer Care: Provides financial assistance, online support groups, medication/co-pay assistance.  1-800-813-HOPE (360)273-7369) ? [place] Assists [place] cancer patients and their families through emotional , educational and financial support.  628-283-2323 ? [place] DSS Where to apply for food stamps, Medicaid and utility assistance. 773-793-8021 ? [employer]: Transportation to medical appointments. 708-732-4971 ? Social Security Administration: May apply for disability if have a Stage IV cancer. (806)522-0767 867-229-2337 ? [employer], Disability and Transit Services: Assists with nutrition, care and transit needs. [place] Support Programs: @10RELATIVEDAYS @ > Cancer Support Group  2nd Tuesday of the month 1pm-2pm, Journey Room  > Creative Journey  3rd Tuesday of the month 1130am-1pm, Journey Room  > Look Good Feel Better  1st Wednesday of the month 10am-12 noon, Journey Room (Call [place] to register [phone])

## 2017-05-02 NOTE — Patient Instructions (Addendum)
[place] at [facility] Discharge Instructions  RECOMMENDATIONS MADE BY THE CONSULTANT AND ANY TEST RESULTS WILL BE SENT TO YOUR REFERRING PHYSICIAN.  You were seen today by Kirby Crigler PA-C. CT scan in 6 months. Return in 6 weeks for labs, port flush and follow up.    Thank you for choosing [place] at [facility] to provide your oncology and hematology care.  To afford each patient quality time with our provider, please arrive at least 15 minutes before your scheduled appointment time.    If you have a lab appointment with the [facility] please come in thru the  Main Entrance and check in at the main information desk  You need to re-schedule your appointment should you arrive 10 or more minutes late.  We strive to give you quality time with our providers, and arriving late affects you and other patients whose appointments are after yours.  Also, if you no show three or more times for appointments you may be dismissed from the clinic at the providers discretion.     Again, thank you for choosing [facility].  Our hope is that these requests will decrease the amount of time that you wait before being seen by our physicians.       _____________________________________________________________  Should you have questions after your visit to [facility], please contact our office at [phone] between the hours of 8:30 a.m. and 4:30 p.m.  Voicemails left after 4:30 p.m. will not be returned until the following business day.  For prescription refill requests, have your pharmacy contact our office.       Resources For Cancer Patients and their Caregivers ? American Cancer Society: Can assist with transportation, wigs, general needs, runs Look Good Feel Better.        424-292-6088 ? Cancer Care: Provides financial assistance, online support groups, medication/co-pay assistance.  1-800-813-HOPE 910-304-7549) ? [place] Assists [place] cancer patients and their families through emotional , educational and financial support.  (236)012-6934 ? [place] DSS Where to apply for food stamps, Medicaid and utility assistance. 505-881-8763 ? [employer]: Transportation to medical appointments. 214 354 2719 ? Social Security Administration: May apply for disability if have a Stage IV cancer. 239-117-1183 (208)610-0302 ? [employer], Disability and Transit Services: Assists with nutrition, care and transit needs. [place] Support Programs: @10RELATIVEDAYS @ > Cancer Support Group  2nd Tuesday of the month 1pm-2pm, Journey Room  > Creative Journey  3rd Tuesday of the month 1130am-1pm, Journey Room  > Look Good Feel Better  1st Wednesday of the month 10am-12 noon, Journey Room (Call [place] to register [phone])

## 2017-05-02 NOTE — Progress Notes (Addendum)
[name], MD [place] [address]  Diffuse large B-cell lymphoma of lymph nodes of multiple regions [facility]) - Plan: allopurinol (ZYLOPRIM) 300 MG tablet, CBC with Differential, Comprehensive metabolic panel, Lactate dehydrogenase, CT Abdomen Pelvis W Contrast, CT Chest W Contrast, US THYROID  Diffuse large B-cell lymphoma, unspecified body region [facility])  CURRENT THERAPY: IT MTX beginning on 04/03/2017  INTERVAL HISTORY: Monica Gray 66 y.o. female returns for followup of Stage IV DLBCL arising from a follicular lymphoma with PET suspicious for hypermetabolic liver lesion (not biopsy proven) and left nasopharyngeal hypermetabolism suspicious for lymphomatous involvement.  Of note, breast lesion is biopsy proven to be DLBCL.  S/P R-CHOP with curative intent x 6 cycles (11/29/2016- 03/14/2017).  Bone marrow aspiration and biopsy prior to treatment initiation was negative for involvement.  Now on prophylactic IT MTX beginning on 04/03/2017.    Diffuse large B cell lymphoma ([place])   09/25/2016 Imaging    CT neck- 1. Large right level 5A a nodal mass measuring up to 2.9 cm. This is suggestive of metastatic disease from an unknown primary. Histologic sampling is recommended. 2. Pleural masses of the medial posterior right chest and anterior lateral left chest. The larger mass, adjacent to the right aspect of the T3 and T4 vertebral bodies, extends into the T2-T3 and T3-T4 neural foramina without definite extension into the spinal canal. These masses are also favored to be metastases. A primary pleural malignancy such as mesothelioma is also a consideration.      10/03/2016 Procedure    Needle core biopsy of right cervical lymph node by IR.      10/04/2016 Pathology Results    Interpretation Tissue-Flow Cytometry - INSUFFICIENT CELLS FOR ANALYSIS.      10/08/2016 Pathology Results    Diagnosis Lymph node, needle/core biopsy, right cervical - ATYPICAL LYMPHOID  PROLIFERATION, SUSPICIOUS FOR NON-HODGKIN B-CELL LYMPHOMA.      10/31/2016 Procedure    Excisional lymph node biopsy by Dr. Dalbert Batman      11/02/2016 Pathology Results    Interpretation Tissue-Flow Cytometry - B-CELL POPULATION WITH KAPPA LIGHT CHAIN EXCESS.      11/02/2016 Pathology Results    Lymph node, biopsy, Deep Cervical - FOLLICULAR AND DIFFUSE LARGE B-CELL LYMPHOMA.      11/09/2016 PET scan    1. Active lymphoma within the neck, chest, abdomen, and pelvis, as detailed above. 2. Medial right breast hypermetabolic nodule is suspicious for a synchronous breast primary. Differential considerations include breast lymphoma or a hypermetabolic benign lesion. Consider diagnostic right-sided mammogram and ultrasound. If the patient is diagnosed with right breast cancer, hypermetabolic small right axillary node would be indeterminate for lymphoma versus metastasis. 3. Right larger than left pleural effusions. 4.  Aortic atherosclerosis. 5. Hypermetabolic liver lesion is favored to represent lymphoma.      11/09/2016 Imaging    CT CAP- 1. Extensive adenopathy in the chest, abdomen and pelvis, bilateral pleural nodularity/masses, omental nodularity and renal lesions, most consistent with lymphoma. 2. Bilateral pleural effusions, large on the right and moderate on the left. 3. Lucent lesion in the L1 vertebral body, indeterminate but well-circumscribed and possibly benign. Attention on followup exams is warranted. 4. Aortic atherosclerosis (ICD10-170.0). Coronary artery calcification. 5. Right adrenal adenoma      11/14/2016 Procedure    Bone marrow aspiration and biopsy by IR      11/16/2016 Pathology Results    Bone Marrow, Aspirate,Biopsy, and Clot - SLIGHTLY HYPERCELLULAR BONE MARROW FOR  AGE WITH TRILINEAGE HEMATOPOIESIS. - SEVERAL LYMPHOID AGGREGATES PRESENT. - SEE COMMENT PERIPHERAL BLOOD: - NO SIGNIFICANT MORPHOLOGIC ABNORMALITIES. Comment: Lower grade  lymphoproliferative process cannot be entirely excluded especially given the paratrabecular location of some of the lymphoid aggregates, the overall findings are very limited and not considered disgnostic. There is no marrow involvement by large cell lymphoma.      11/16/2016 Procedure    BREAST BIOPSY      11/16/2016 Pathology Results    Breast, right, needle core biopsy, 2:30 o'clock, 3 cm from nipple - NON HODGKIN'S B CELL LYMPHOMA. - SEE ONCOLOGY TABLE. 2. Lymph node, needle/core biopsy, right axilla - NON HODGKIN'S B CELL LYMPHOMA. - SEE ONCOLOGY TABLE. Microscopic Comment 1. LYMPHOMA      11/20/2016 Echocardiogram    Left ventricle: The cavity size was normal. Wall thickness was normal. Systolic function was normal. The estimated ejection fraction was in the range of 60% to 65%. Doppler parameters are consistent with abnormal left ventricular relaxation (grade 1 diastolic dysfunction). Doppler parameters are consistent with high ventricular filling pressure.      11/20/2016 Procedure    Successful ultrasound guided right thoracentesis yielding 800 mL of pleural fluid.      11/20/2016 Pathology Results    PLEURAL FLUID, RIGHT - REACTIVE MESOTHELIAL CELLS AND SMALL LYMPHOID CELLS PRESENT. - SEE COMMENT. [name] MD Pathologist, Electronic Signature (Case signed 11/22/2016)      11/29/2016 - 03/14/2017 Chemotherapy    The patient had DOXOrubicin (ADRIAMYCIN) chemo injection 78 mg, 50 mg/m2 = 78 mg, Intravenous,  Once, 6 of 6 cycles  palonosetron (ALOXI) injection 0.25 mg, 0.25 mg, Intravenous,  Once, 6 of 6 cycles  pegfilgrastim (NEULASTA) injection 6 mg, 6 mg, Subcutaneous,  Once, 1 of 1 cycle  pegfilgrastim (NEULASTA ONPRO KIT) injection 6 mg, 6 mg, Subcutaneous, Once, 5 of 5 cycles  vinCRIStine (ONCOVIN) 2 mg in sodium chloride 0.9 % 50 mL chemo infusion, 2 mg, Intravenous,  Once, 6 of 6 cycles  riTUXimab (RITUXAN) 600 mg in sodium chloride 0.9 % 250  mL (1.9355 mg/mL) chemo infusion, 375 mg/m2 = 600 mg, Intravenous,  Once, 6 of 6 cycles  cyclophosphamide (CYTOXAN) 1,180 mg in sodium chloride 0.9 % 250 mL chemo infusion, 750 mg/m2 = 1,180 mg, Intravenous,  Once, 6 of 6 cycles  for chemotherapy treatment.        01/28/2017 PET scan    1. Marked improvement with essential resolution of the hypermetabolic activity in the adenopathy of the neck, chest, abdomen, and pelvis, and marked reduced size of associated lymph nodes. A right inguinal lymph node is currently [place] 2. Most of the prior lymph nodes are [place] 1. 2. The left posterior nasopharyngeal activity has also resolved. 3. Moderately hypermetabolic small right thyroid nodule with maximum SUV 11.9. A significant minority of such hypermetabolic thyroid nodules can be malignant, and thyroid ultrasound should be considered for further characterization. 4. Other imaging findings of potential clinical significance: Trace residual right pleural effusion. Old granulomatous disease. Aortic arch and abdominal aortoiliac atherosclerotic vascular disease.      04/03/2017 -  Chemotherapy    IT MTX beginning on 04/03/2017 every 21 days x 6 cycles       04/04/2017 Pathology Results    Diagnosis CEREBROSPINAL FLUID(SPECIMEN 1 OF 1 COLLECTED 04/03/17): FEW MONONUCLEAR CELLS PRESENT.      04/25/2017 Pathology Results    CEREBROSPINAL FLUID(SPECIMEN 1 OF 1 COLLECTED 04/24/17): NO MALIGNANT CELLS IDENTIFIED.      04/30/2017 PET scan  1. No metabolically active lymphoma. Mildly enlarged non hypermetabolic right external iliac lymph node is decreased in size, compatible with treated lymphoma. 2. Persistent diffuse thyroid hypermetabolism with heterogeneous attenuation on the CT images without discrete thyroid nodules, more suggestive of nonspecific diffuse thyroiditis. Recommend correlation with serum thyroid function tests. Consider thyroid ultrasound correlation. 3. Additional  findings include aortic atherosclerosis, 1 vessel coronary atherosclerosis, mild emphysema and stable right adrenal adenoma.      05/02/2017 Remission    PET demonstrates NED.        HPI Elements   Location: Lymph nodes  Quality: Diffuse large-B-cell lymphoma  Severity: Stage IV  Duration: Dx in December 2017  Context: Breast biopsy proven DLBCL.  Hypermetabolic liver lesion and left nasopharyngeal activity.  Timing: Started IT MTX prophylactically beginning on 04/03/2017 following R-CHOP (11/29/2016- 03/14/2017).  Modifying Factors: Anxiety/nervousness  Associated Signs & Symptoms:    She has completed her therapy.  She is here today to review her most recent restaging PET scan after completing curative therapy.  She is excited to learn that she is in a complete remission based upon PET scan criteria.  She did start her intrathecal methotrexate in May 2018.  She reports mixed experiences.  She notes that one experiences better than the other.  I provided her education regarding lumbar punctures and the fact that sometimes they go very well and at other times there are issues with discomfort since this is essentially a blind technique.  Her issues associated with her bad experience are typical and not uncommon.  Each time, all of her symptoms resolved.  Due to her experience, she is hesitant to continue with intrathecal chemotherapy.  I reviewed the risks, benefits, alternatives of this intervention and the fact that she is at high risk for CNS involvement/relapse due to the location of her disease in the nasopharyngeal area at time of diagnosis.  She strongly encouraged to reconsider and continue with intrathecal chemotherapy.  After long discussion, she is agreeable and she plans on taking the therapy on a cycle by cycle basis.  We briefly reviewed the guidelines for surveillance moving forward.  Review of Systems  Constitutional: Negative.  Negative for chills, fever and weight loss.    HENT: Negative.   Eyes: Negative.   Respiratory: Negative.  Negative for cough.   Cardiovascular: Negative.  Negative for chest pain.  Gastrointestinal: Negative.  Negative for blood in stool, constipation, diarrhea, melena, nausea and vomiting.  Genitourinary: Negative.   Musculoskeletal: Negative.   Skin: Negative.   Neurological: Negative.  Negative for weakness.  Endo/Heme/Allergies: Negative.   Psychiatric/Behavioral: Negative.     Past Medical History:  Diagnosis Date  . Allergic to cats   . Anxiety   . Arthritis    hands  . Diffuse large B cell lymphoma ([place]) 10/31/2016  . Dyspnea    with exertion - intermittent, per pt.; no O2  . History of asthma    as a child  . History of hepatitis C    states was cured in 2016 after [facility]  . Immature cataract   . Sensitive skin   . Wears dentures    upper; lower denture attaches to 4 dental implants, per pt.    Past Surgical History:  Procedure Laterality Date  . BONE MARROW ASPIRATION Right 11/14/2016   iliac  . BREAST BIOPSY Right   . CHEST TUBE INSERTION     1987, 1988  . LAPAROSCOPIC UNILATERAL SALPINGO OOPHERECTOMY     ectopic pregnancy  .  LYMPH NODE BIOPSY Right 10/31/2016   Procedure: EXCISION DEEP RIGHT CERVICAL LYMPH NODES;  Surgeon: Fanny Skates, MD;  Location: [place];  Service: General;  Laterality: Right;  . PORTACATH PLACEMENT Left 11/21/2016   Procedure: INSERTION PORT-A-CATH WITH [place];  Surgeon: Fanny Skates, MD;  Location: [place];  Service: General;  Laterality: Left;    Family History  Problem Relation Age of Onset  . Leukemia Mother   . Emphysema Father     Social History   Social History  . Marital status: Married    Spouse name: N/A  . Number of children: N/A  . Years of education: N/A   Social History Main Topics  . Smoking status: Former Smoker    Quit date: 1984  . Smokeless tobacco: Never Used  . Alcohol use No  . Drug use: No  . Sexual activity: Not Asked    Other Topics Concern  . None   Social History Narrative  . None     PHYSICAL EXAMINATION  ECOG PERFORMANCE STATUS: 1 - Symptomatic but completely ambulatory  Vitals:   05/02/17 1145  BP: (!) 152/82  Pulse: 83  Resp: 16    GENERAL:alert, no distress, well nourished, well developed, anxious, comfortable, cooperative, smiling and accompanied by husband SKIN: skin color, texture, turgor are normal, no rashes or significant lesions HEAD: Normocephalic, No masses, lesions, tenderness or abnormalities EYES: normal, EOMI, Conjunctiva are pink and non-injected EARS: External ears normal OROPHARYNX:lips, buccal mucosa, and tongue normal and mucous membranes are moist  NECK: supple, no adenopathy, trachea midline LYMPH:  no palpable lymphadenopathy BREAST:not examined LUNGS: clear to auscultation  HEART: regular rate & rhythm, no murmurs and no gallops ABDOMEN:abdomen soft and normal bowel sounds BACK: Back symmetric, no curvature. EXTREMITIES:less then 2 second capillary refill, no joint deformities, effusion, or inflammation, no skin discoloration, no cyanosis  NEURO: alert & oriented x 3 with fluent speech, no focal motor/sensory deficits, gait normal   LABORATORY DATA: CBC    Component Value Date/Time   WBC 1.9 (L) 05/02/2017 1141   RBC 3.40 (L) 05/02/2017 1141   HGB 11.4 (L) 05/02/2017 1141   HCT 33.0 (L) 05/02/2017 1141   PLT 130 (L) 05/02/2017 1141   MCV 97.1 05/02/2017 1141   MCH 33.5 05/02/2017 1141   MCHC 34.5 05/02/2017 1141   RDW 12.5 05/02/2017 1141   LYMPHSABS 0.5 (L) 05/02/2017 1141   MONOABS 0.3 05/02/2017 1141   EOSABS 0.1 05/02/2017 1141   BASOSABS 0.0 05/02/2017 1141      Chemistry      Component Value Date/Time   NA 134 (L) 05/02/2017 1141   K 4.0 05/02/2017 1141   CL 100 (L) 05/02/2017 1141   CO2 25 05/02/2017 1141   BUN 17 05/02/2017 1141   CREATININE 0.52 05/02/2017 1141      Component Value Date/Time   CALCIUM 9.3 05/02/2017 1141    ALKPHOS 55 05/02/2017 1141   AST 28 05/02/2017 1141   ALT 20 05/02/2017 1141   BILITOT 0.6 05/02/2017 1141        PENDING LABS:   RADIOGRAPHIC STUDIES:  Nm Pet Image Restag (ps) Skull Base To Thigh  Result Date: 04/29/2017 CLINICAL DATA:  Subsequent treatment strategy for stage IV diffuse large B-cell lymphoma diagnosed 10/31/2016 status post completion of chemotherapy. EXAM: NUCLEAR MEDICINE PET SKULL BASE TO THIGH TECHNIQUE: 5.9 mCi F-18 FDG was injected intravenously. Full-ring PET imaging was performed from the skull base to thigh after the radiotracer. CT data  was obtained and used for attenuation correction and anatomic localization. FASTING BLOOD GLUCOSE:  Value: 104 mg/dl COMPARISON:  01/28/2017 PET-CT. FINDINGS: NECK No hypermetabolic lymph nodes in the neck. Persistent diffuse thyroid hypermetabolism with heterogeneous attenuation on CT images, without discrete thyroid nodules, max SUV 8.4, previous max SUV 11.9. CHEST No enlarged or hypermetabolic axillary, mediastinal or hilar nodes. Left subclavian MediPort terminates at the cavoatrial junction. Left anterior descending coronary atherosclerosis. Mildly atherosclerotic nonaneurysmal thoracic aorta. No pneumothorax. No pleural effusions. Mild centrilobular and paraseptal emphysema with mild diffuse bronchial wall thickening. Scattered calcified subcentimeter granulomas throughout both lungs, unchanged. No acute consolidative airspace disease or new significant pulmonary nodules. ABDOMEN/PELVIS No hypermetabolic lymph nodes in the abdomen or pelvis. Mildly enlarged 1.2 cm right external iliac node (series 4/ image 157) is non hypermetabolic (max SUV 2.2, below the mediastinal blood pool activity) and decreased in size from 2.0 cm on 01/28/2017. No abnormal hypermetabolic activity within the liver, pancreas, adrenal glands, or spleen. Atherosclerotic nonaneurysmal abdominal aorta. Simple 1.2 cm anterior liver cyst. Additional scattered  subcentimeter hypodense liver lesions are too small to characterize and not appreciably changed, suggesting benign lesions. No new liver lesions. Stable 1.3 cm right adrenal adenoma. Stable exophytic 1.2 cm mildly hyperdense renal cortical lesion in the posterior upper left kidney. SKELETON No focal hypermetabolic activity to suggest skeletal metastasis. IMPRESSION: 1. No metabolically active lymphoma. Mildly enlarged non hypermetabolic right external iliac lymph node is decreased in size, compatible with treated lymphoma. 2. Persistent diffuse thyroid hypermetabolism with heterogeneous attenuation on the CT images without discrete thyroid nodules, more suggestive of nonspecific diffuse thyroiditis. Recommend correlation with serum thyroid function tests. Consider thyroid ultrasound correlation. 3. Additional findings include aortic atherosclerosis, 1 vessel coronary atherosclerosis, mild emphysema and stable right adrenal adenoma. Electronically Signed   By: Ilona Sorrel M.D.   On: 04/29/2017 11:46   Dg Fluoro Guided Loc Of Needle/cath Tip For Spinal Inject Lt  Result Date: 04/24/2017 CLINICAL DATA:  Diffuse large B-cell lymphoma, intrathecal chemotherapy EXAM: FLUOROSCOPICALLY GUIDED LUMBAR PUNCTURE FOR INTRATHECAL CHEMOTHERAPY TECHNIQUE: Procedure, benefits, and risks were discussed with the patient, including alternatives. Patient's questions were answered. Written informed consent was obtained. Timeout protocol followed. Patient placed prone. L4-L5 disc space was localized under fluoroscopy. Skin prepped and draped in usual sterile fashion. Skin and soft tissues anesthetized with 2 mL of 1% lidocaine. 22 gauge needle was advanced into the spinal canal. Traumatic tap with minimally bloody CSF initially which cleared. The needle twice occluded due to blood and was replaced both times at the same disc space level. 6 mL of CSF was obtained for requested analysis. Patient was then injected with requested 12 mg of  methotrexate for intrathecal chemotherapy. Procedure tolerated very well by patient without immediate complication. FLUOROSCOPY TIME:  1 minutes 12 seconds IMPRESSION: Intrathecal injection of chemotherapy without complication. Electronically Signed   By: Lavonia Dana M.D.   On: 04/24/2017 12:48   Dg Fluoro Guided Loc Of Needle/cath Tip For Spinal Inject Lt  Result Date: 04/03/2017 CLINICAL DATA:  Diffuse large B-cell lymphoma. EXAM: FLUOROSCOPICALLY GUIDED LUMBAR PUNCTURE FOR INTRATHECAL CHEMOTHERAPY TECHNIQUE: Informed consent was obtained from the patient prior to the procedure, including potential complications of headache, allergy, and pain. A 'time out' was performed. With the patient prone, the lower back was prepped with Betadine. 1% Lidocaine was used for local anesthesia. Lumbar puncture was performed at the L3-4 level using a 21 gauge needle with return of clear CSF. 18 cc of spinal fluid was  collected for laboratory examination. 5 cc of methotrexate was injected into the subarachnoid space. The patient tolerated the procedure well without apparent complication. FLUOROSCOPY TIME:  0 minutes 6 seconds IMPRESSION: Intrathecal injection of chemotherapy without complication. Electronically Signed   By: Lorriane Shire M.D.   On: 04/03/2017 11:51     PATHOLOGY:    ASSESSMENT AND PLAN:  Diffuse large B cell lymphoma ([place]) Stage IV DLBCL arising from a follicular lymphoma with PET suspicious for hypermetabolic liver lesion (not biopsy proven) and left nasopharyngeal hypermetabolism suspicious for lymphomatous involvement.  Of note, breast lesion is biopsy proven to be DLBCL.  S/P R-CHOP with curative intent x 6 cycles (11/29/2016- 03/14/2017).  Bone marrow aspiration and biopsy prior to treatment initiation was negative for involvement.  Now on prophylactic IT MTX beginning on 04/03/2017.  PET scan on 04/29/2017 demonstrates CR.  Oncology history updated.  Port flush with labs today: CBC diff, CMET,  LDH, TSH.  I personally reviewed and went over laboratory results with the patient.  The results are noted within this dictation.  I personally reviewed and went over radiographic studies with the patient.  The results are noted within this dictation.  I personally reviewed the images in PACS.  PET scan on 04/29/2017 demonstrates complete remission of disease.  Labs and port flush in 6 weeks: CBC diff, CMET, LDH.  CT CAP is ordered and will be completed in 6 months in accordance with the NCCN guidelines.  She will continue taking allopurinol until Rx is completed.  I have refilled this medication today.  She is advised to continue with Lexapro.  She is interested in discontinuing this medication, but I think it is best to continue until treatment is completed.  She reports mixed experiences with her 2 IT MTX thus far.  I have recommended she take 1 Xanax 1 hours prior to procedure and this can be repeated 1-2 times in the waiting room prior to procedure.  She is very interested in having her port removed.  I think after her next CT scan in December 2018, we can consider having it removed.  Return in 6 weeks for follow-up and ongoing encouragement for completion of 6 cycles of IT MTX.  Addendum: TSH is WNL.  Will order [place] of thyroid to confirm no concerning issue(s).   ORDERS PLACED FOR THIS ENCOUNTER: Orders Placed This Encounter  Procedures  . CT Abdomen Pelvis W Contrast  . CT Chest W Contrast  . US THYROID  . CBC with Differential  . Comprehensive metabolic panel  . Lactate dehydrogenase    MEDICATIONS PRESCRIBED THIS ENCOUNTER: Meds ordered this encounter  Medications  . allopurinol (ZYLOPRIM) 300 MG tablet    Sig: TAKE 1 TABLET(300 MG) BY MOUTH DAILY    Dispense:  30 tablet    Refill:  2    When Rx complete, no more refills needed    Order Specific Question:   Supervising Provider    Answer:   Brunetta Genera [[id number]]    THERAPY PLAN:  Complete 6 cycles of IT  MTX prophylactically.  NCCN guidelines recommends the follow surveillance for Stage I, II for those who asertain a complete response in the first-line treatment setting (3.2018):  A. H&P every 3-6 months for 5 years, then yearly or as clinically indicated.  B. Labs every 3-6 months for 5 years and then annually or as clinically indicated.  C. Imaging only as indicated. For those ascertaining a complete response in the Stage III, IV  setting in first-line treatment setting:  A. H&P every 3-6 months for 5 years, then yearly or as clinically indicated.  B. Labs every 3-6 months for 5 years and then annually or as clinically indicated.  C. CT C/A/P with contrast no more than every 6 months for 2 years after completion of treatment; then only as indicated.   All questions were answered. The patient knows to call the clinic with any problems, questions or concerns. We can certainly see the patient much sooner if necessary.  Patient and plan discussed with Dr. Twana First and she is in agreement with the aforementioned.   This note is electronically signed by: Doy Mince 05/02/2017 2:22 PM

## 2017-05-02 NOTE — Addendum Note (Signed)
Addended by: Baird Cancer on: 05/02/2017 02:22 PM   Modules accepted: Orders

## 2017-05-02 NOTE — Assessment & Plan Note (Addendum)
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  Of note, breast lesion is biopsy proven to be DLBCL.  S/P R-CHOP with curative intent x 6 cycles (11/29/2016- 03/14/2017).  Bone marrow aspiration and biopsy prior to treatment initiation was negative for involvement.  Now on prophylactic IT MTX beginning on 04/03/2017.  PET scan on 04/29/2017 demonstrates CR.  Oncology history updated.  Port flush with labs today: CBC diff, CMET, LDH, TSH.  I personally reviewed and went over laboratory results with the patient.  The results are noted within this dictation.  I personally reviewed and went over radiographic studies with the patient.  The results are noted within this dictation.  I personally reviewed the images in PACS.  PET scan on 04/29/2017 demonstrates complete remission of disease.  Labs and port flush in 6 weeks: CBC diff, CMET, LDH.  CT CAP is ordered and will be completed in 6 months in accordance with the NCCN guidelines.  She will continue taking allopurinol until Rx is completed.  I have refilled this medication today.  She is advised to continue with Lexapro.  She is interested in discontinuing this medication, but I think it is best to continue until treatment is completed.  She reports mixed experiences with her 2 IT MTX thus far.  I have recommended she take 1 Xanax 1 hours prior to procedure and this can be repeated 1-2 times in the waiting room prior to procedure.  She is very interested in having her port removed.  I think after her next CT scan in December 2018, we can consider having it removed.  Return in 6 weeks for follow-up and ongoing encouragement for completion of 6 cycles of IT MTX.  Addendum: TSH is WNL.  Will order [place] of thyroid to confirm no concerning issue(s).

## 2017-05-02 NOTE — Progress Notes (Signed)
Monica Gray tolerated port lab draw and flush well without complaints or incident. Port accessed with 20 gauge needle with blood drawn for labs then flushed with 20 ml NS and 5 ml Heparin easily per protocol. VSS Pt discharged self ambulatory in satisfactory condition accompanied by her husband

## 2017-05-07 ENCOUNTER — Ambulatory Visit (HOSPITAL_COMMUNITY)
Admission: RE | Admit: 2017-05-07 | Discharge: 2017-05-07 | Disposition: A | Discharge status: Home / Self Care | Payer: BLUE CROSS/BLUE SHIELD | Source: Ambulatory Visit | Attending: Oncology | Admitting: Oncology

## 2017-05-07 DIAGNOSIS — E041 Nontoxic single thyroid nodule: Secondary | ICD-10-CM | POA: Diagnosis not present

## 2017-05-07 DIAGNOSIS — C8338 Diffuse large B-cell lymphoma, lymph nodes of multiple sites: Secondary | ICD-10-CM | POA: Diagnosis not present

## 2017-05-09 ENCOUNTER — Other Ambulatory Visit (HOSPITAL_COMMUNITY): Payer: Self-pay | Admitting: Oncology

## 2017-05-09 DIAGNOSIS — E041 Nontoxic single thyroid nodule: Secondary | ICD-10-CM

## 2017-05-10 ENCOUNTER — Other Ambulatory Visit (HOSPITAL_COMMUNITY): Payer: Self-pay | Admitting: Oncology

## 2017-05-10 DIAGNOSIS — E041 Nontoxic single thyroid nodule: Secondary | ICD-10-CM

## 2017-05-15 ENCOUNTER — Other Ambulatory Visit (HOSPITAL_COMMUNITY): Payer: Self-pay | Admitting: Pharmacist

## 2017-05-15 ENCOUNTER — Encounter (HOSPITAL_COMMUNITY): Payer: BLUE CROSS/BLUE SHIELD

## 2017-05-15 ENCOUNTER — Ambulatory Visit (HOSPITAL_COMMUNITY): Payer: BLUE CROSS/BLUE SHIELD

## 2017-05-16 ENCOUNTER — Ambulatory Visit (HOSPITAL_COMMUNITY)
Admission: RE | Admit: 2017-05-16 | Discharge: 2017-05-16 | Disposition: A | Discharge status: Home / Self Care | Payer: BLUE CROSS/BLUE SHIELD | Source: Ambulatory Visit | Attending: Oncology | Admitting: Oncology

## 2017-05-16 DIAGNOSIS — E041 Nontoxic single thyroid nodule: Secondary | ICD-10-CM

## 2017-05-16 MED ORDER — LIDOCAINE HCL (PF) 2 % IJ SOLN
INTRAMUSCULAR | Status: AC
  Filled 2017-05-16: qty 10

## 2017-05-16 NOTE — Sedation Documentation (Signed)
Procedure completed at [id number]. Dressing and ice pack applied to puncture site

## 2017-05-16 NOTE — Procedures (Signed)
  US guided Right thyroid nodule biopsy 3 samples obtained using 25 G needle  Sent to pathology Tolerated well

## 2017-05-16 NOTE — Discharge Instructions (Signed)
Thyroid Biopsy °The thyroid gland is a butterfly-shaped gland located in the front of the neck. It produces hormones that affect metabolism, growth and development, and body temperature. Thyroid biopsy is a procedure in which small samples of tissue or fluid are removed from the thyroid gland. The samples are then looked at under a microscope to check for abnormalities. This procedure is done to determine the cause of thyroid problems. It may be done to check for infection, cancer, or other thyroid problems. °Two methods may be used for a thyroid biopsy. In one method, a thin needle is inserted through the skin and into the thyroid gland. In the other method, an open incision is made through the skin. °Tell a health care provider about: °· Any allergies you have. °· All medicines you are taking, including vitamins, herbs, eye drops, creams, and over-the-counter medicines. °· Any problems you or family members have had with anesthetic medicines. °· Any blood disorders you have. °· Any surgeries you have had. °· Any medical conditions you have. °What are the risks? °Generally, this is a safe procedure. However, problems can occur and include: °· Bleeding from the procedure site. °· Infection. °· Injury to structures near the thyroid gland. ° °What happens before the procedure? °· Ask your health care provider about: °? Changing or stopping your regular medicines. This is especially important if you are taking diabetes medicines or blood thinners. °? Taking medicines such as aspirin and ibuprofen. These medicines can thin your blood. Do not take these medicines before your procedure if your health care provider asks you not to. °· Do not eat or drink anything after midnight on the night before the procedure or as directed by your health care provider. °· You may have a blood sample taken. °What happens during the procedure? °Either of these methods may be used to perform a thyroid biopsy: °· Fine needle biopsy. You may  be given medicine to help you relax (sedative). You will be asked to lie on your back with your head tipped backward to extend your neck. An area on your neck will be cleaned. A needle will then be inserted through the skin of your neck. You may be asked to avoid coughing, talking, swallowing, or making sounds during some portions of the procedure. The needle will be withdrawn once the tissue or fluid samples have been removed. Pressure may be applied to your neck to reduce swelling and ensure that bleeding has stopped. The samples will be sent to a lab for examination. °· Open biopsy. You will be given medicine to make you sleep (general anesthetic). An incision will be made in your neck. A sample of thyroid tissue will be removed using surgical tools. The tissue sample will be sent for examination. In some cases, the sample may be examined during the biopsy. If that is done and cancer cells are found, some or all of the thyroid gland may be removed. The incision will be closed with stitches. ° °What happens after the procedure? °· Your recovery will be assessed and monitored. °· You may have soreness and tenderness at the site of the biopsy. This should go away after a few days. °· If you had an open biopsy, you may have a hoarse voice or sore throat for a couple days. °· It is your responsibility to get your test results. °This information is not intended to replace advice given to you by your health care provider. Make sure you discuss any questions you have with   your health care provider. °Document Released: 09/02/2007 Document Revised: 07/08/2016 Document Reviewed: 01/28/2014 °[employer]vier Interactive Patient Education © 2018 [employer] ° °

## 2017-05-16 NOTE — Sedation Documentation (Signed)
Procedure begun at [facility]. A time out was conducted, consent signed.

## 2017-05-16 NOTE — Sedation Documentation (Signed)
Discharge instructions reviewed with patient. Time allowed for questions and patient discharged in stable condition. Verbalizes understanding of instructions and follow up. No s/s distress.

## 2017-05-16 NOTE — Sedation Documentation (Signed)
No bleeding or swelling noted. No dysphagia or respiratory distress noted.

## 2017-05-29 ENCOUNTER — Other Ambulatory Visit (HOSPITAL_COMMUNITY): Payer: Self-pay | Admitting: Pharmacist

## 2017-06-03 ENCOUNTER — Telehealth (HOSPITAL_COMMUNITY): Payer: Self-pay | Admitting: Emergency Medicine

## 2017-06-03 NOTE — Telephone Encounter (Signed)
Monica Gray wanted to know how to wean off her lexapro.  Spoke with Kirby Crigler PA and he said to take 1/2 for 3 days then to stop taking it.  Pt verbalized understanding.

## 2017-06-05 ENCOUNTER — Ambulatory Visit (HOSPITAL_COMMUNITY): Payer: BLUE CROSS/BLUE SHIELD

## 2017-06-05 ENCOUNTER — Encounter (HOSPITAL_COMMUNITY): Payer: BLUE CROSS/BLUE SHIELD

## 2017-06-13 ENCOUNTER — Encounter (HOSPITAL_BASED_OUTPATIENT_CLINIC_OR_DEPARTMENT_OTHER): Payer: BLUE CROSS/BLUE SHIELD

## 2017-06-13 ENCOUNTER — Encounter (HOSPITAL_COMMUNITY): Payer: Self-pay

## 2017-06-13 ENCOUNTER — Encounter (HOSPITAL_COMMUNITY): Payer: BLUE CROSS/BLUE SHIELD | Attending: Adult Health | Admitting: Oncology

## 2017-06-13 VITALS — BP 128/74 | HR 76 | Resp 18 | Ht 63.0 in | Wt 122.0 lb

## 2017-06-13 DIAGNOSIS — C8330 Diffuse large B-cell lymphoma, unspecified site: Secondary | ICD-10-CM

## 2017-06-13 DIAGNOSIS — C8338 Diffuse large B-cell lymphoma, lymph nodes of multiple sites: Secondary | ICD-10-CM | POA: Insufficient documentation

## 2017-06-13 DIAGNOSIS — Z87891 Personal history of nicotine dependence: Secondary | ICD-10-CM | POA: Insufficient documentation

## 2017-06-13 DIAGNOSIS — Z8572 Personal history of non-Hodgkin lymphomas: Secondary | ICD-10-CM

## 2017-06-13 DIAGNOSIS — F419 Anxiety disorder, unspecified: Secondary | ICD-10-CM | POA: Diagnosis not present

## 2017-06-13 LAB — CBC WITH DIFFERENTIAL/PLATELET
BASOS PCT: 1 %
Basophils Absolute: 0 10*3/uL (ref 0.0–0.1)
Eosinophils Absolute: 0.2 10*3/uL (ref 0.0–0.7)
Eosinophils Relative: 5 %
HEMATOCRIT: 35.1 % — AB (ref 36.0–46.0)
HEMOGLOBIN: 12.2 g/dL (ref 12.0–15.0)
LYMPHS ABS: 0.9 10*3/uL (ref 0.7–4.0)
Lymphocytes Relative: 23 %
MCH: 32.7 pg (ref 26.0–34.0)
MCHC: 34.8 g/dL (ref 30.0–36.0)
MCV: 94.1 fL (ref 78.0–100.0)
MONO ABS: 0.4 10*3/uL (ref 0.1–1.0)
MONOS PCT: 10 %
NEUTROS ABS: 2.3 10*3/uL (ref 1.7–7.7)
NEUTROS PCT: 61 %
Platelets: 167 10*3/uL (ref 150–400)
RBC: 3.73 MIL/uL — ABNORMAL LOW (ref 3.87–5.11)
RDW: 12.6 % (ref 11.5–15.5)
WBC: 3.8 10*3/uL — ABNORMAL LOW (ref 4.0–10.5)

## 2017-06-13 LAB — COMPREHENSIVE METABOLIC PANEL
ALK PHOS: 57 U/L (ref 38–126)
ALT: 24 U/L (ref 14–54)
ANION GAP: 7 (ref 5–15)
AST: 31 U/L (ref 15–41)
Albumin: 4.5 g/dL (ref 3.5–5.0)
BILIRUBIN TOTAL: 0.8 mg/dL (ref 0.3–1.2)
BUN: 18 mg/dL (ref 6–20)
CALCIUM: 9.6 mg/dL (ref 8.9–10.3)
CO2: 28 mmol/L (ref 22–32)
Chloride: 98 mmol/L — ABNORMAL LOW (ref 101–111)
Creatinine, Ser: 0.54 mg/dL (ref 0.44–1.00)
GLUCOSE: 88 mg/dL (ref 65–99)
POTASSIUM: 4.3 mmol/L (ref 3.5–5.1)
Sodium: 133 mmol/L — ABNORMAL LOW (ref 135–145)
TOTAL PROTEIN: 7.6 g/dL (ref 6.5–8.1)

## 2017-06-13 LAB — LACTATE DEHYDROGENASE: LDH: 138 U/L (ref 98–192)

## 2017-06-13 MED ORDER — POLYETHYLENE GLYCOL 3350 17 GM/SCOOP PO POWD: ORAL | 2 refills | Status: DC

## 2017-06-13 MED ORDER — SODIUM CHLORIDE 0.9% FLUSH
10.0000 mL | INTRAVENOUS | Status: DC | PRN
  Administered 2017-06-13: 10 mL via INTRAVENOUS
  Filled 2017-06-13: qty 10

## 2017-06-13 MED ORDER — HEPARIN SOD (PORK) LOCK FLUSH 100 UNIT/ML IV SOLN
500.0000 [IU] | Freq: Once | INTRAVENOUS | Status: AC
  Administered 2017-06-13: 500 [IU] via INTRAVENOUS

## 2017-06-13 NOTE — Progress Notes (Signed)
Monica Gray presented for Portacath access and flush. Portacath located left chest wall accessed with  H 20 needle. Good blood return present. Portacath flushed with 34ml NS and 500U/18ml Heparin and needle removed intact. Procedure without incident. Patient tolerated procedure well. Patient discharged ambulatory and in stable condition from clinic. Follow up as scheduled.

## 2017-06-13 NOTE — Progress Notes (Signed)
Monica Blitz, MD [address] [place] [address]  Diffuse large B-cell lymphoma, unspecified body region [facility]) - Plan: CBC with Differential, Comprehensive metabolic panel, Lactate dehydrogenase  CURRENT THERAPY: IT MTX beginning on 04/03/2017  INTERVAL HISTORY: Ramsie Harten 66 y.o. female returns for followup of Stage IV DLBCL arising from a follicular lymphoma with PET suspicious for hypermetabolic liver lesion (not biopsy proven) and left nasopharyngeal hypermetabolism suspicious for lymphomatous involvement.  Of note, breast lesion is biopsy proven to be DLBCL.  S/P R-CHOP with curative intent x 6 cycles (11/29/2016- 03/14/2017).  Bone marrow aspiration and biopsy prior to treatment initiation was negative for involvement.  Now on prophylactic IT MTX beginning on 04/03/2017.    Diffuse large B cell lymphoma ([place])   09/25/2016 Imaging    CT neck- 1. Large right level 5A a nodal mass measuring up to 2.9 cm. This is suggestive of metastatic disease from an unknown primary. Histologic sampling is recommended. 2. Pleural masses of the medial posterior right chest and anterior lateral left chest. The larger mass, adjacent to the right aspect of the T3 and T4 vertebral bodies, extends into the T2-T3 and T3-T4 neural foramina without definite extension into the spinal canal. These masses are also favored to be metastases. A primary pleural malignancy such as mesothelioma is also a consideration.      10/03/2016 Procedure    Needle core biopsy of right cervical lymph node by IR.      10/04/2016 Pathology Results    Interpretation Tissue-Flow Cytometry - INSUFFICIENT CELLS FOR ANALYSIS.      10/08/2016 Pathology Results    Diagnosis Lymph node, needle/core biopsy, right cervical - ATYPICAL LYMPHOID PROLIFERATION, SUSPICIOUS FOR NON-HODGKIN B-CELL LYMPHOMA.      10/31/2016 Procedure    Excisional lymph node biopsy by Dr. Dalbert Batman      11/02/2016 Pathology Results   Interpretation Tissue-Flow Cytometry - B-CELL POPULATION WITH KAPPA LIGHT CHAIN EXCESS.      11/02/2016 Pathology Results    Lymph node, biopsy, Deep Cervical - FOLLICULAR AND DIFFUSE LARGE B-CELL LYMPHOMA.      11/09/2016 PET scan    1. Active lymphoma within the neck, chest, abdomen, and pelvis, as detailed above. 2. Medial right breast hypermetabolic nodule is suspicious for a synchronous breast primary. Differential considerations include breast lymphoma or a hypermetabolic benign lesion. Consider diagnostic right-sided mammogram and ultrasound. If the patient is diagnosed with right breast cancer, hypermetabolic small right axillary node would be indeterminate for lymphoma versus metastasis. 3. Right larger than left pleural effusions. 4.  Aortic atherosclerosis. 5. Hypermetabolic liver lesion is favored to represent lymphoma.      11/09/2016 Imaging    CT CAP- 1. Extensive adenopathy in the chest, abdomen and pelvis, bilateral pleural nodularity/masses, omental nodularity and renal lesions, most consistent with lymphoma. 2. Bilateral pleural effusions, large on the right and moderate on the left. 3. Lucent lesion in the L1 vertebral body, indeterminate but well-circumscribed and possibly benign. Attention on followup exams is warranted. 4. Aortic atherosclerosis (ICD10-170.0). Coronary artery calcification. 5. Right adrenal adenoma      11/14/2016 Procedure    Bone marrow aspiration and biopsy by IR      11/16/2016 Pathology Results    Bone Marrow, Aspirate,Biopsy, and Clot - SLIGHTLY HYPERCELLULAR BONE MARROW FOR AGE WITH TRILINEAGE HEMATOPOIESIS. - SEVERAL LYMPHOID AGGREGATES PRESENT. - SEE COMMENT PERIPHERAL BLOOD: - NO SIGNIFICANT MORPHOLOGIC ABNORMALITIES. Comment: Lower grade lymphoproliferative process cannot be entirely excluded especially given  the paratrabecular location of some of the lymphoid aggregates, the overall findings are very limited and  not considered disgnostic. There is no marrow involvement by large cell lymphoma.      11/16/2016 Procedure    BREAST BIOPSY      11/16/2016 Pathology Results    Breast, right, needle core biopsy, 2:30 o'clock, 3 cm from nipple - NON HODGKIN'S B CELL LYMPHOMA. - SEE ONCOLOGY TABLE. 2. Lymph node, needle/core biopsy, right axilla - NON HODGKIN'S B CELL LYMPHOMA. - SEE ONCOLOGY TABLE. Microscopic Comment 1. LYMPHOMA      11/20/2016 Echocardiogram    Left ventricle: The cavity size was normal. Wall thickness was normal. Systolic function was normal. The estimated ejection fraction was in the range of 60% to 65%. Doppler parameters are consistent with abnormal left ventricular relaxation (grade 1 diastolic dysfunction). Doppler parameters are consistent with high ventricular filling pressure.      11/20/2016 Procedure    Successful ultrasound guided right thoracentesis yielding 800 mL of pleural fluid.      11/20/2016 Pathology Results    PLEURAL FLUID, RIGHT - REACTIVE MESOTHELIAL CELLS AND SMALL LYMPHOID CELLS PRESENT. - SEE COMMENT. [name] MD Pathologist, Electronic Signature (Case signed 11/22/2016)      11/29/2016 - 03/14/2017 Chemotherapy    The patient had DOXOrubicin (ADRIAMYCIN) chemo injection 78 mg, 50 mg/m2 = 78 mg, Intravenous,  Once, 6 of 6 cycles  palonosetron (ALOXI) injection 0.25 mg, 0.25 mg, Intravenous,  Once, 6 of 6 cycles  pegfilgrastim (NEULASTA) injection 6 mg, 6 mg, Subcutaneous,  Once, 1 of 1 cycle  pegfilgrastim (NEULASTA ONPRO KIT) injection 6 mg, 6 mg, Subcutaneous, Once, 5 of 5 cycles  vinCRIStine (ONCOVIN) 2 mg in sodium chloride 0.9 % 50 mL chemo infusion, 2 mg, Intravenous,  Once, 6 of 6 cycles  riTUXimab (RITUXAN) 600 mg in sodium chloride 0.9 % 250 mL (1.9355 mg/mL) chemo infusion, 375 mg/m2 = 600 mg, Intravenous,  Once, 6 of 6 cycles  cyclophosphamide (CYTOXAN) 1,180 mg in sodium chloride 0.9 % 250 mL chemo infusion, 750  mg/m2 = 1,180 mg, Intravenous,  Once, 6 of 6 cycles  for chemotherapy treatment.        01/28/2017 PET scan    1. Marked improvement with essential resolution of the hypermetabolic activity in the adenopathy of the neck, chest, abdomen, and pelvis, and marked reduced size of associated lymph nodes. A right inguinal lymph node is currently [place] 2. Most of the prior lymph nodes are [place] 1. 2. The left posterior nasopharyngeal activity has also resolved. 3. Moderately hypermetabolic small right thyroid nodule with maximum SUV 11.9. A significant minority of such hypermetabolic thyroid nodules can be malignant, and thyroid ultrasound should be considered for further characterization. 4. Other imaging findings of potential clinical significance: Trace residual right pleural effusion. Old granulomatous disease. Aortic arch and abdominal aortoiliac atherosclerotic vascular disease.      04/03/2017 - 04/24/2017 Chemotherapy    IT MTX beginning on 04/03/2017 every 21 days x 6 cycles. Patient completed 2 intrathecal treatments but then opted to discontinue the rest of it. She stated she kept falling after both treatments.        04/04/2017 Pathology Results    Diagnosis CEREBROSPINAL FLUID(SPECIMEN 1 OF 1 COLLECTED 04/03/17): FEW MONONUCLEAR CELLS PRESENT.      04/25/2017 Pathology Results    CEREBROSPINAL FLUID(SPECIMEN 1 OF 1 COLLECTED 04/24/17): NO MALIGNANT CELLS IDENTIFIED.      04/30/2017 PET scan    1. No metabolically active  lymphoma. Mildly enlarged non hypermetabolic right external iliac lymph node is decreased in size, compatible with treated lymphoma. 2. Persistent diffuse thyroid hypermetabolism with heterogeneous attenuation on the CT images without discrete thyroid nodules, more suggestive of nonspecific diffuse thyroiditis. Recommend correlation with serum thyroid function tests. Consider thyroid ultrasound correlation. 3. Additional findings include aortic  atherosclerosis, 1 vessel coronary atherosclerosis, mild emphysema and stable right adrenal adenoma.      05/02/2017 Remission    PET demonstrates NED.      05/08/2017 Imaging    US thyroid- 9 mm right midpole solid isoechoic TR 3 nodule correlates with the hypermetabolic nodule by recent PET-CT. Although the nodule is stable compared to 09/24/2016 and does not meet TI RADS criteria for biopsy, because of the hypermetabolism on PET imaging, biopsy of the nodule would be recommended to exclude malignancy.      05/16/2017 Procedure    Tyroid nodule biopsy      05/17/2017 Pathology Results    THYROID, FINE NEEDLE ASPIRATION, RIGHT (SPECIMEN 1 OF 1 COLLECTED 05/16/17): CONSISTENT WITH BENIGN FOLLICULAR NODULE (BETHESDA CATEGORY II).       Patient presented today for continued follow up. She states she has been feeling well. Her appetite is very good and she is eating a lot. She denies any drenching night sweats, weight loss, or fevers/chills. Her only complaint is constipation today.  Review of Systems  Constitutional: Negative.  Negative for chills, fever and weight loss.  HENT: Negative.   Eyes: Negative.   Respiratory: Negative.  Negative for cough.   Cardiovascular: Negative.  Negative for chest pain.  Gastrointestinal: Positive for constipation. Negative for blood in stool, diarrhea, melena, nausea and vomiting.  Genitourinary: Negative.   Musculoskeletal: Negative.   Skin: Negative.   Neurological: Negative.  Negative for weakness.  Endo/Heme/Allergies: Negative.   Psychiatric/Behavioral: Negative.     Past Medical History:  Diagnosis Date  . Allergic to cats   . Anxiety   . Arthritis    hands  . Diffuse large B cell lymphoma ([place]) 10/31/2016  . Dyspnea    with exertion - intermittent, per pt.; no O2  . History of asthma    as a child  . History of hepatitis C    states was cured in 2016 after [facility]  . Immature cataract   . Sensitive skin   . Wears dentures     upper; lower denture attaches to 4 dental implants, per pt.    Past Surgical History:  Procedure Laterality Date  . BONE MARROW ASPIRATION Right 11/14/2016   iliac  . BREAST BIOPSY Right   . CHEST TUBE INSERTION     1987, 1988  . LAPAROSCOPIC UNILATERAL SALPINGO OOPHERECTOMY     ectopic pregnancy  . LYMPH NODE BIOPSY Right 10/31/2016   Procedure: EXCISION DEEP RIGHT CERVICAL LYMPH NODES;  Surgeon: Fanny Skates, MD;  Location: [facility];  Service: General;  Laterality: Right;  . PORTACATH PLACEMENT Left 11/21/2016   Procedure: INSERTION PORT-A-CATH WITH [place];  Surgeon: Fanny Skates, MD;  Location: [facility];  Service: General;  Laterality: Left;    Family History  Problem Relation Age of Onset  . Leukemia Mother   . Emphysema Father     Social History   Social History  . Marital status: Married    Spouse name: N/A  . Number of children: N/A  . Years of education: N/A   Social History Main Topics  . Smoking status: Former Smoker    Quit date:  1984  . Smokeless tobacco: Never Used  . Alcohol use No  . Drug use: No  . Sexual activity: Not Asked   Other Topics Concern  . None   Social History Narrative  . None     PHYSICAL EXAMINATION  ECOG PERFORMANCE STATUS: 1 - Symptomatic but completely ambulatory  Vitals:   06/13/17 1056  BP: 128/74  Pulse: 76  Resp: 18    GENERAL:alert, no distress, well nourished, well developed, anxious, comfortable, cooperative, smiling and accompanied by husband SKIN: skin color, texture, turgor are normal, no rashes or significant lesions HEAD: Normocephalic, No masses, lesions, tenderness or abnormalities EYES: normal, EOMI, Conjunctiva are pink and non-injected EARS: External ears normal OROPHARYNX:lips, buccal mucosa, and tongue normal and mucous membranes are moist  NECK: supple, no adenopathy, trachea midline LYMPH:  no palpable cervical, supraclavicular, axillary lymphadenopathy BREAST:not examined LUNGS:  clear to auscultation  HEART: regular rate & rhythm, no murmurs and no gallops ABDOMEN:abdomen soft and normal bowel sounds BACK: Back symmetric, no curvature. EXTREMITIES:less then 2 second capillary refill, no joint deformities, effusion, or inflammation, no skin discoloration, no cyanosis  NEURO: alert & oriented x 3 with fluent speech, no focal motor/sensory deficits, gait normal   LABORATORY DATA: CBC    Component Value Date/Time   WBC 3.8 (L) 06/13/2017 1051   RBC 3.73 (L) 06/13/2017 1051   HGB 12.2 06/13/2017 1051   HCT 35.1 (L) 06/13/2017 1051   PLT 167 06/13/2017 1051   MCV 94.1 06/13/2017 1051   MCH 32.7 06/13/2017 1051   MCHC 34.8 06/13/2017 1051   RDW 12.6 06/13/2017 1051   LYMPHSABS 0.9 06/13/2017 1051   MONOABS 0.4 06/13/2017 1051   EOSABS 0.2 06/13/2017 1051   BASOSABS 0.0 06/13/2017 1051      Chemistry      Component Value Date/Time   NA 134 (L) 05/02/2017 1141   K 4.0 05/02/2017 1141   CL 100 (L) 05/02/2017 1141   CO2 25 05/02/2017 1141   BUN 17 05/02/2017 1141   CREATININE 0.52 05/02/2017 1141      Component Value Date/Time   CALCIUM 9.3 05/02/2017 1141   ALKPHOS 55 05/02/2017 1141   AST 28 05/02/2017 1141   ALT 20 05/02/2017 1141   BILITOT 0.6 05/02/2017 1141        PENDING LABS:   RADIOGRAPHIC STUDIES:  US Guided Needle Placement  Result Date: 05/28/2017 CLINICAL DATA:  Ultrasound was provided for use by the ordering physician, and a technical charge was applied by the performing facility.  No radiologist interpretation/professional services rendered.   US Thyroid Biopsy  Result Date: 05/16/2017 INDICATION: Indeterminate thyroid nodule EXAM: ULTRASOUND GUIDED FINE NEEDLE ASPIRATION OF INDETERMINATE THYROID NODULE COMPARISON:  US Thyroid 05/07/2017 +PET 04/29/2017 MEDICATIONS: 5 cc 1% lidocaine COMPLICATIONS: None immediate. TECHNIQUE: Informed written consent was obtained from the patient after a discussion of the risks, benefits and  alternatives to treatment. Questions regarding the procedure were encouraged and answered. A timeout was performed prior to the initiation of the procedure. Pre-procedural ultrasound scanning demonstrated unchanged size and appearance of the indeterminate nodule within the right thyroid The procedure was planned. The neck was prepped in the usual sterile fashion, and a sterile drape was applied covering the operative field. A timeout was performed prior to the initiation of the procedure. Local anesthesia was provided with 1% lidocaine. Under direct ultrasound guidance, 3 FNA biopsies were performed of the right thyroid nodule with a 25 gauge needle. Multiple ultrasound images were saved for  procedural documentation purposes. The samples were prepared and submitted to pathology. Limited post procedural scanning was negative for hematoma or additional complication. Dressings were placed. The patient tolerated the above procedures procedure well without immediate postprocedural complication. FINDINGS: Nodule reference number based on prior diagnostic ultrasound: 1 Maximum size:  0.9 cm Location: Right; Mid ACR TI-RADS risk category: TR3 (3 points) Reason for biopsy: patient/referrer request Ultrasound imaging confirms appropriate placement of the needles within the thyroid nodule. IMPRESSION: Technically successful ultrasound guided fine needle aspiration of right thyroid nodule Read by Lavonia Drafts [facility] Electronically Signed   By: Lavonia Dana M.D.   On: 05/16/2017 08:33     ASSESSMENT AND PLAN:  Stage IV DLBCL arising from a follicular lymphoma with PET suspicious for hypermetabolic liver lesion (not biopsy proven) and left nasopharyngeal hypermetabolism suspicious for lymphomatous involvement.  Of note, breast lesion is biopsy proven to be DLBCL.   S/P R-CHOP with curative intent x 6 cycles (11/29/2016- 03/14/2017).  Bone marrow aspiration and biopsy prior to treatment initiation was negative for involvement.  PET scan on 04/29/2017 demonstrates CR.  Received 2 out of the planned 6 cycles of intrathecal methotrexate, patient opted to discontinue any further treatments because she did not feel well after them and kept having balance issues and falling.   -Clinically NED. -Continue surveillance. -She will have repeat PET-CT in December 2018 which will be 6 months from her last scans. -RTC in 3 months for follow up with labs. -Refilled her miralax PRN constipation. However encouraged patient to increase the fiber in her diet.    ORDERS PLACED FOR THIS ENCOUNTER: Orders Placed This Encounter  Procedures  . CBC with Differential  . Comprehensive metabolic panel  . Lactate dehydrogenase    MEDICATIONS PRESCRIBED THIS ENCOUNTER: Meds ordered this encounter  Medications  . polyethylene glycol powder (MIRALAX) powder    Sig: Take 1 capful daily.    Dispense:  255 g    Refill:  2      NCCN guidelines recommends the follow surveillance for Stage I, II for those who asertain a complete response in the first-line treatment setting (3.2018):  A. H&P every 3-6 months for 5 years, then yearly or as clinically indicated.  B. Labs every 3-6 months for 5 years and then annually or as clinically indicated.  C. Imaging only as indicated. For those ascertaining a complete response in the Stage III, IV setting in first-line treatment setting:  A. H&P every 3-6 months for 5 years, then yearly or as clinically indicated.  B. Labs every 3-6 months for 5 years and then annually or as clinically indicated.  C. CT C/A/P with contrast no more than every 6 months for 2 years after completion of treatment; then only as indicated.   All questions were answered. The patient knows to call the clinic with any problems, questions or concerns. We can certainly see the patient much sooner if necessary.    This note is electronically signed by: Twana First, MD 06/13/2017 11:33 AM

## 2017-06-13 NOTE — Patient Instructions (Signed)
[facility] at [facility] Discharge Instructions  RECOMMENDATIONS MADE BY THE CONSULTANT AND ANY TEST RESULTS WILL BE SENT TO YOUR REFERRING PHYSICIAN.  You had your port flushed today. Continue to get it flushed every 6-8 weeks.  Thank you for choosing [facility] at [facility] to provide your oncology and hematology care.  To afford each patient quality time with our provider, please arrive at least 15 minutes before your scheduled appointment time.    If you have a lab appointment with the Cancer Center please come in thru the  Main Entrance and check in at the main information desk  You need to re-schedule your appointment should you arrive 10 or more minutes late.  We strive to give you quality time with our providers, and arriving late affects you and other patients whose appointments are after yours.  Also, if you no show three or more times for appointments you may be dismissed from the clinic at the providers discretion.     Again, thank you for choosing [facility].  Our hope is that these requests will decrease the amount of time that you wait before being seen by our physicians.       _____________________________________________________________  Should you have questions after your visit to [facility], please contact our office at [phone] between the hours of 8:30 a.m. and 4:30 p.m.  Voicemails left after 4:30 p.m. will not be returned until the following business day.  For prescription refill requests, have your pharmacy contact our office.       Resources For Cancer Patients and their Caregivers ? American Cancer Society: Can assist with transportation, wigs, general needs, runs Look Good Feel Better.        1-888-227-6333 ? Cancer Care: Provides financial assistance, online support groups, medication/co-pay assistance.  1-800-813-HOPE (4673) ? [facility] Assists  [place] cancer patients and their families through emotional , educational and financial support.  336-427-4357 ? [place] DSS Where to apply for food stamps, Medicaid and utility assistance. 336-342-1394 ? [facility]: Transportation to medical appointments. 336-347-2287 ? Social Security Administration: May apply for disability if have a Stage IV cancer. 336-342-7796 1-800-772-1213 ? [place] Aging, Disability and Transit Services: Assists with nutrition, care and transit needs. 336-349-2343  Cancer Center Support Programs: @[email or web address]@ > Cancer Support Group  2nd Tuesday of the month 1pm-2pm, Journey Room  > Creative Journey  3rd Tuesday of the month 1130am-1pm, Journey Room  > Look Good Feel Better  1st Wednesday of the month 10am-12 noon, Journey Room (Call American Cancer Society to register 1-800-395-5775)    

## 2017-06-26 ENCOUNTER — Ambulatory Visit (HOSPITAL_COMMUNITY): Payer: BLUE CROSS/BLUE SHIELD

## 2017-06-26 ENCOUNTER — Encounter (HOSPITAL_COMMUNITY): Payer: BLUE CROSS/BLUE SHIELD

## 2017-07-17 ENCOUNTER — Ambulatory Visit (HOSPITAL_COMMUNITY)
Admission: RE | Admit: 2017-07-17 | Discharge: 2017-07-17 | Disposition: A | Discharge status: Home / Self Care | Payer: BLUE CROSS/BLUE SHIELD | Source: Ambulatory Visit | Attending: Oncology | Admitting: Oncology

## 2017-07-17 ENCOUNTER — Encounter (HOSPITAL_COMMUNITY): Payer: BLUE CROSS/BLUE SHIELD

## 2017-07-17 DIAGNOSIS — C8330 Diffuse large B-cell lymphoma, unspecified site: Secondary | ICD-10-CM

## 2017-07-24 ENCOUNTER — Other Ambulatory Visit (HOSPITAL_COMMUNITY): Payer: Self-pay

## 2017-07-24 DIAGNOSIS — C8330 Diffuse large B-cell lymphoma, unspecified site: Secondary | ICD-10-CM

## 2017-07-24 MED ORDER — TRAMADOL HCL 50 MG PO TABS: 50.0000 mg | ORAL_TABLET | Freq: Two times a day (BID) | ORAL | 0 refills | Status: DC | PRN

## 2017-07-24 NOTE — Telephone Encounter (Signed)
Received refill request from patients pharmacy for tramadol. Reviewed with provider, chart checked and refilled.

## 2017-07-25 ENCOUNTER — Other Ambulatory Visit (HOSPITAL_COMMUNITY): Payer: BLUE CROSS/BLUE SHIELD

## 2017-09-12 ENCOUNTER — Encounter (HOSPITAL_COMMUNITY): Payer: Medicare Other | Attending: Adult Health | Admitting: Adult Health

## 2017-09-12 ENCOUNTER — Encounter (HOSPITAL_COMMUNITY): Payer: Self-pay | Admitting: Adult Health

## 2017-09-12 ENCOUNTER — Encounter (HOSPITAL_BASED_OUTPATIENT_CLINIC_OR_DEPARTMENT_OTHER): Payer: Medicare Other

## 2017-09-12 VITALS — BP 131/73 | HR 81 | Temp 98.5°F | Resp 18 | Ht 63.0 in | Wt 126.4 lb

## 2017-09-12 DIAGNOSIS — G629 Polyneuropathy, unspecified: Secondary | ICD-10-CM | POA: Diagnosis not present

## 2017-09-12 DIAGNOSIS — Z885 Allergy status to narcotic agent status: Secondary | ICD-10-CM | POA: Insufficient documentation

## 2017-09-12 DIAGNOSIS — B1920 Unspecified viral hepatitis C without hepatic coma: Secondary | ICD-10-CM | POA: Diagnosis not present

## 2017-09-12 DIAGNOSIS — Z91048 Other nonmedicinal substance allergy status: Secondary | ICD-10-CM | POA: Insufficient documentation

## 2017-09-12 DIAGNOSIS — Z9221 Personal history of antineoplastic chemotherapy: Secondary | ICD-10-CM | POA: Insufficient documentation

## 2017-09-12 DIAGNOSIS — Z791 Long term (current) use of non-steroidal anti-inflammatories (NSAID): Secondary | ICD-10-CM | POA: Insufficient documentation

## 2017-09-12 DIAGNOSIS — Z9889 Other specified postprocedural states: Secondary | ICD-10-CM | POA: Insufficient documentation

## 2017-09-12 DIAGNOSIS — Z8572 Personal history of non-Hodgkin lymphomas: Secondary | ICD-10-CM

## 2017-09-12 DIAGNOSIS — F419 Anxiety disorder, unspecified: Secondary | ICD-10-CM | POA: Diagnosis not present

## 2017-09-12 DIAGNOSIS — Z882 Allergy status to sulfonamides status: Secondary | ICD-10-CM | POA: Diagnosis not present

## 2017-09-12 DIAGNOSIS — Z888 Allergy status to other drugs, medicaments and biological substances status: Secondary | ICD-10-CM | POA: Diagnosis not present

## 2017-09-12 DIAGNOSIS — C8338 Diffuse large B-cell lymphoma, lymph nodes of multiple sites: Secondary | ICD-10-CM

## 2017-09-12 DIAGNOSIS — D3501 Benign neoplasm of right adrenal gland: Secondary | ICD-10-CM | POA: Diagnosis not present

## 2017-09-12 DIAGNOSIS — Z886 Allergy status to analgesic agent status: Secondary | ICD-10-CM | POA: Diagnosis not present

## 2017-09-12 DIAGNOSIS — R531 Weakness: Secondary | ICD-10-CM | POA: Insufficient documentation

## 2017-09-12 DIAGNOSIS — Z23 Encounter for immunization: Secondary | ICD-10-CM | POA: Insufficient documentation

## 2017-09-12 DIAGNOSIS — Z79899 Other long term (current) drug therapy: Secondary | ICD-10-CM | POA: Diagnosis not present

## 2017-09-12 DIAGNOSIS — J45909 Unspecified asthma, uncomplicated: Secondary | ICD-10-CM | POA: Diagnosis not present

## 2017-09-12 DIAGNOSIS — C8330 Diffuse large B-cell lymphoma, unspecified site: Secondary | ICD-10-CM | POA: Diagnosis not present

## 2017-09-12 DIAGNOSIS — I700 Atherosclerosis of aorta: Secondary | ICD-10-CM | POA: Insufficient documentation

## 2017-09-12 DIAGNOSIS — Z79891 Long term (current) use of opiate analgesic: Secondary | ICD-10-CM | POA: Insufficient documentation

## 2017-09-12 DIAGNOSIS — Z87891 Personal history of nicotine dependence: Secondary | ICD-10-CM | POA: Diagnosis not present

## 2017-09-12 DIAGNOSIS — F418 Other specified anxiety disorders: Secondary | ICD-10-CM

## 2017-09-12 DIAGNOSIS — Z825 Family history of asthma and other chronic lower respiratory diseases: Secondary | ICD-10-CM | POA: Insufficient documentation

## 2017-09-12 DIAGNOSIS — Z806 Family history of leukemia: Secondary | ICD-10-CM | POA: Diagnosis not present

## 2017-09-12 DIAGNOSIS — Z91013 Allergy to seafood: Secondary | ICD-10-CM | POA: Insufficient documentation

## 2017-09-12 DIAGNOSIS — J439 Emphysema, unspecified: Secondary | ICD-10-CM | POA: Insufficient documentation

## 2017-09-12 DIAGNOSIS — F329 Major depressive disorder, single episode, unspecified: Secondary | ICD-10-CM | POA: Diagnosis not present

## 2017-09-12 DIAGNOSIS — I2510 Atherosclerotic heart disease of native coronary artery without angina pectoris: Secondary | ICD-10-CM | POA: Insufficient documentation

## 2017-09-12 LAB — COMPREHENSIVE METABOLIC PANEL
ALT: 16 U/L (ref 14–54)
ANION GAP: 10 (ref 5–15)
AST: 22 U/L (ref 15–41)
Albumin: 4.5 g/dL (ref 3.5–5.0)
Alkaline Phosphatase: 63 U/L (ref 38–126)
BUN: 18 mg/dL (ref 6–20)
CALCIUM: 9.4 mg/dL (ref 8.9–10.3)
CHLORIDE: 102 mmol/L (ref 101–111)
CO2: 25 mmol/L (ref 22–32)
Creatinine, Ser: 0.48 mg/dL (ref 0.44–1.00)
GFR calc Af Amer: >60 mL/min (ref >60)
GFR calc non Af Amer: >60 mL/min (ref >60)
Glucose, Bld: 94 mg/dL (ref 65–99)
POTASSIUM: 4.1 mmol/L (ref 3.5–5.1)
SODIUM: 137 mmol/L (ref 135–145)
Total Bilirubin: 0.8 mg/dL (ref 0.3–1.2)
Total Protein: 7.5 g/dL (ref 6.5–8.1)

## 2017-09-12 LAB — CBC WITH DIFFERENTIAL/PLATELET
BASOS ABS: 0 10*3/uL (ref 0.0–0.1)
BASOS PCT: 1 %
EOS ABS: 0.1 10*3/uL (ref 0.0–0.7)
EOS PCT: 3 %
HCT: 35.4 % — ABNORMAL LOW (ref 36.0–46.0)
Hemoglobin: 12.2 g/dL (ref 12.0–15.0)
Lymphocytes Relative: 17 %
Lymphs Abs: 0.7 10*3/uL (ref 0.7–4.0)
MCH: 31.9 pg (ref 26.0–34.0)
MCHC: 34.5 g/dL (ref 30.0–36.0)
MCV: 92.7 fL (ref 78.0–100.0)
Monocytes Absolute: 0.5 10*3/uL (ref 0.1–1.0)
Monocytes Relative: 12 %
Neutro Abs: 2.7 10*3/uL (ref 1.7–7.7)
Neutrophils Relative %: 67 %
PLATELETS: 162 10*3/uL (ref 150–400)
RBC: 3.82 MIL/uL — AB (ref 3.87–5.11)
RDW: 12.9 % (ref 11.5–15.5)
WBC: 4.1 10*3/uL (ref 4.0–10.5)

## 2017-09-12 LAB — LACTATE DEHYDROGENASE: LDH: 133 U/L (ref 98–192)

## 2017-09-12 MED ORDER — SODIUM CHLORIDE 0.9% FLUSH
10.0000 mL | INTRAVENOUS | Status: DC | PRN
  Administered 2017-09-12: 10 mL via INTRAVENOUS
  Filled 2017-09-12: qty 10

## 2017-09-12 MED ORDER — INFLUENZA VAC SPLIT QUAD 0.5 ML IM SUSY
PREFILLED_SYRINGE | INTRAMUSCULAR | Status: AC
  Filled 2017-09-12: qty 0.5

## 2017-09-12 MED ORDER — HEPARIN SOD (PORK) LOCK FLUSH 100 UNIT/ML IV SOLN
500.0000 [IU] | Freq: Once | INTRAVENOUS | Status: AC
  Administered 2017-09-12: 500 [IU] via INTRAVENOUS
  Filled 2017-09-12: qty 5

## 2017-09-12 MED ORDER — ESCITALOPRAM OXALATE 20 MG PO TABS: ORAL_TABLET | ORAL | 2 refills | Status: DC

## 2017-09-12 MED ORDER — INFLUENZA VAC SPLIT QUAD 0.5 ML IM SUSY
0.5000 mL | PREFILLED_SYRINGE | Freq: Once | INTRAMUSCULAR | Status: AC
  Administered 2017-09-12: 0.5 mL via INTRAMUSCULAR

## 2017-09-12 NOTE — Progress Notes (Signed)
Monica Gray presented for Portacath access and flush. Portacath located left chest wall accessed with  H 20 needle.  Good blood return present. Portacath flushed with 56ml NS and 500U/62ml Heparin and needle removed intact.  Procedure tolerated well and without incident.  Monica Gray presents today for injection per the provider's orders.  Fluarix administration without incident; see MAR for injection details.  Patient tolerated procedure well and without incident.  No questions or complaints noted at this time.  Discharged ambulatory in c/o spouse.

## 2017-09-12 NOTE — Progress Notes (Signed)
[address], [place] [address]   CLINIC:  Medical Oncology/Hematology  PCP:  [name], [place] [address] [phone]   REASON FOR VISIT:  Follow-up for Stage IV diffuse large B-cell lymphoma (DLBCL)   CURRENT THERAPY: Surveillance    BRIEF ONCOLOGIC HISTORY:    Diffuse large B cell lymphoma ([facility])   09/25/2016 Imaging    CT neck- 1. Large right level 5A a nodal mass measuring up to 2.9 cm. This is suggestive of metastatic disease from an unknown primary. Histologic sampling is recommended. 2. Pleural masses of the medial posterior right chest and anterior lateral left chest. The larger mass, adjacent to the right aspect of the T3 and T4 vertebral bodies, extends into the T2-T3 and T3-T4 neural foramina without definite extension into the spinal canal. These masses are also favored to be metastases. A primary pleural malignancy such as mesothelioma is also a consideration.      10/03/2016 Procedure    Needle core biopsy of right cervical lymph node by IR.      10/04/2016 Pathology Results    Interpretation Tissue-Flow Cytometry - INSUFFICIENT CELLS FOR ANALYSIS.      10/08/2016 Pathology Results    Diagnosis Lymph node, needle/core biopsy, right cervical - ATYPICAL LYMPHOID PROLIFERATION, SUSPICIOUS FOR NON-HODGKIN B-CELL LYMPHOMA.      10/31/2016 Procedure    Excisional lymph node biopsy by Dr. Dalbert Batman      11/02/2016 Pathology Results    Interpretation Tissue-Flow Cytometry - B-CELL POPULATION WITH KAPPA LIGHT CHAIN EXCESS.      11/02/2016 Pathology Results    Lymph node, biopsy, Deep Cervical - FOLLICULAR AND DIFFUSE LARGE B-CELL LYMPHOMA.      11/09/2016 PET scan    1. Active lymphoma within the neck, chest, abdomen, and pelvis, as detailed above. 2. Medial right breast hypermetabolic nodule is suspicious for a synchronous breast primary. Differential considerations include breast lymphoma  or a hypermetabolic benign lesion. Consider diagnostic right-sided mammogram and ultrasound. If the patient is diagnosed with right breast cancer, hypermetabolic small right axillary node would be indeterminate for lymphoma versus metastasis. 3. Right larger than left pleural effusions. 4.  Aortic atherosclerosis. 5. Hypermetabolic liver lesion is favored to represent lymphoma.      11/09/2016 Imaging    CT CAP- 1. Extensive adenopathy in the chest, abdomen and pelvis, bilateral pleural nodularity/masses, omental nodularity and renal lesions, most consistent with lymphoma. 2. Bilateral pleural effusions, large on the right and moderate on the left. 3. Lucent lesion in the L1 vertebral body, indeterminate but well-circumscribed and possibly benign. Attention on followup exams is warranted. 4. Aortic atherosclerosis (ICD10-170.0). Coronary artery calcification. 5. Right adrenal adenoma      11/14/2016 Procedure    Bone marrow aspiration and biopsy by IR      11/16/2016 Pathology Results    Bone Marrow, Aspirate,Biopsy, and Clot - SLIGHTLY HYPERCELLULAR BONE MARROW FOR AGE WITH TRILINEAGE HEMATOPOIESIS. - SEVERAL LYMPHOID AGGREGATES PRESENT. - SEE COMMENT PERIPHERAL BLOOD: - NO SIGNIFICANT MORPHOLOGIC ABNORMALITIES. Comment: Lower grade lymphoproliferative process cannot be entirely excluded especially given the paratrabecular location of some of the lymphoid aggregates, the overall findings are very limited and not considered disgnostic. There is no marrow involvement by large cell lymphoma.      11/16/2016 Procedure    BREAST BIOPSY      11/16/2016 Pathology Results    Breast, right, needle core biopsy, 2:30 o'clock, 3 cm from nipple - NON HODGKIN'S B  CELL LYMPHOMA. - SEE ONCOLOGY TABLE. 2. Lymph node, needle/core biopsy, right axilla - NON HODGKIN'S B CELL LYMPHOMA. - SEE ONCOLOGY TABLE. Microscopic Comment 1. LYMPHOMA      11/20/2016 Echocardiogram    Left  ventricle: The cavity size was normal. Wall thickness was normal. Systolic function was normal. The estimated ejection fraction was in the range of 60% to 65%. Doppler parameters are consistent with abnormal left ventricular relaxation (grade 1 diastolic dysfunction). Doppler parameters are consistent with high ventricular filling pressure.      11/20/2016 Procedure    Successful ultrasound guided right thoracentesis yielding 800 mL of pleural fluid.      11/20/2016 Pathology Results    PLEURAL FLUID, RIGHT - REACTIVE MESOTHELIAL CELLS AND SMALL LYMPHOID CELLS PRESENT. - SEE COMMENT. [name] MD Pathologist, Electronic Signature (Case signed 11/22/2016)      11/29/2016 - 03/14/2017 Chemotherapy    The patient had DOXOrubicin (ADRIAMYCIN) chemo injection 78 mg, 50 mg/m2 = 78 mg, Intravenous,  Once, 6 of 6 cycles  palonosetron (ALOXI) injection 0.25 mg, 0.25 mg, Intravenous,  Once, 6 of 6 cycles  pegfilgrastim (NEULASTA) injection 6 mg, 6 mg, Subcutaneous,  Once, 1 of 1 cycle  pegfilgrastim (NEULASTA ONPRO KIT) injection 6 mg, 6 mg, Subcutaneous, Once, 5 of 5 cycles  vinCRIStine (ONCOVIN) 2 mg in sodium chloride 0.9 % 50 mL chemo infusion, 2 mg, Intravenous,  Once, 6 of 6 cycles  riTUXimab (RITUXAN) 600 mg in sodium chloride 0.9 % 250 mL (1.9355 mg/mL) chemo infusion, 375 mg/m2 = 600 mg, Intravenous,  Once, 6 of 6 cycles  cyclophosphamide (CYTOXAN) 1,180 mg in sodium chloride 0.9 % 250 mL chemo infusion, 750 mg/m2 = 1,180 mg, Intravenous,  Once, 6 of 6 cycles  for chemotherapy treatment.        01/28/2017 PET scan    1. Marked improvement with essential resolution of the hypermetabolic activity in the adenopathy of the neck, chest, abdomen, and pelvis, and marked reduced size of associated lymph nodes. A right inguinal lymph node is currently Deauville 2. Most of the prior lymph nodes are Deauville 1. 2. The left posterior nasopharyngeal activity has also  resolved. 3. Moderately hypermetabolic small right thyroid nodule with maximum SUV 11.9. A significant minority of such hypermetabolic thyroid nodules can be malignant, and thyroid ultrasound should be considered for further characterization. 4. Other imaging findings of potential clinical significance: Trace residual right pleural effusion. Old granulomatous disease. Aortic arch and abdominal aortoiliac atherosclerotic vascular disease.      04/03/2017 - 04/24/2017 Chemotherapy    IT MTX beginning on 04/03/2017 every 21 days x 6 cycles. Patient completed 2 intrathecal treatments but then opted to discontinue the rest of it. She stated she kept falling after both treatments.        04/04/2017 Pathology Results    Diagnosis CEREBROSPINAL FLUID(SPECIMEN 1 OF 1 COLLECTED 04/03/17): FEW MONONUCLEAR CELLS PRESENT.      04/25/2017 Pathology Results    CEREBROSPINAL FLUID(SPECIMEN 1 OF 1 COLLECTED 04/24/17): NO MALIGNANT CELLS IDENTIFIED.      04/30/2017 PET scan    1. No metabolically active lymphoma. Mildly enlarged non hypermetabolic right external iliac lymph node is decreased in size, compatible with treated lymphoma. 2. Persistent diffuse thyroid hypermetabolism with heterogeneous attenuation on the CT images without discrete thyroid nodules, more suggestive of nonspecific diffuse thyroiditis. Recommend correlation with serum thyroid function tests. Consider thyroid ultrasound correlation. 3. Additional findings include aortic atherosclerosis, 1 vessel coronary atherosclerosis, mild emphysema and stable right  adrenal adenoma.      05/02/2017 Remission    PET demonstrates NED.      05/08/2017 Imaging    US thyroid- 9 mm right midpole solid isoechoic TR 3 nodule correlates with the hypermetabolic nodule by recent PET-CT. Although the nodule is stable compared to 09/24/2016 and does not meet TI RADS criteria for biopsy, because of the hypermetabolism on PET imaging, biopsy of  the nodule would be recommended to exclude malignancy.      05/16/2017 Procedure    Tyroid nodule biopsy      05/17/2017 Pathology Results    THYROID, FINE NEEDLE ASPIRATION, RIGHT (SPECIMEN 1 OF 1 COLLECTED 05/16/17): CONSISTENT WITH BENIGN FOLLICULAR NODULE (BETHESDA CATEGORY II).        HISTORY OF PRESENT ILLNESS:  (From Dr. Juanda Chance last note on 01/31/17)     INTERVAL HISTORY:  Mr. Kasler 66 y.o. female returns for follow-up for DLBCL.   Here today with her husband. Appetite 100%; energy levels 75-100%. Denies any new adenopathy, night sweats, fever/chills, recent infections, unintentional weight loss, or severe fatigue.    Her biggest complaint toda is her mood.  States "I have been so grouchy lately. I'm very irritable and everything gets on my nerves. I can't concentrate on anything and my memory is horrible. I feel like I'm in a fog."  She shares with me that she stopped her Lexapro about 5-6 months ago on her own "because I didn't want to be on medicines."    Shares with me that she has been feeling more stressed lately because she is trying to apply for disability and was initially denied. She is planning to submit additional paperwork "since I found out that I'm stage IV."  Reports "ever since I had that chemo in my back, I've been falling a lot and my gait is horrible."  Denies any headaches or dizziness per se; "my balance is just really bad."  She has fallen ~5 times in the past few months. Denies syncopal episodes contributing to falls. Denies any serious injuries or hitting her head with these falls. Also reports peripheral neuropathy to her toes, which has been present since chemo.  She has a myriad of other complaints today including diarrhea, N&V, hot flashes, itching, feeling weak, easy bruising, and sleep problems.         REVIEW OF SYSTEMS:  Review of Systems  per HPI; 12-point ROS completed and negative except as noted above.     PAST MEDICAL/SURGICAL HISTORY:   Past Medical History:  Diagnosis Date  . Allergic to cats   . Anxiety   . Arthritis    hands  . Diffuse large B cell lymphoma ([facility]) 10/31/2016  . Dyspnea    with exertion - intermittent, per pt.; no O2  . History of asthma    as a child  . History of hepatitis C    states was cured in 2016 after [facility]  . Immature cataract   . Sensitive skin   . Wears dentures    upper; lower denture attaches to 4 dental implants, per pt.   Past Surgical History:  Procedure Laterality Date  . BONE MARROW ASPIRATION Right 11/14/2016   iliac  . BREAST BIOPSY Right   . CHEST TUBE INSERTION     1987, 1988  . LAPAROSCOPIC UNILATERAL SALPINGO OOPHERECTOMY     ectopic pregnancy  . LYMPH NODE BIOPSY Right 10/31/2016   Procedure: EXCISION DEEP RIGHT CERVICAL LYMPH NODES;  Surgeon: Claud Kelp, MD;  Location:  [facility];  Service: General;  Laterality: Right;  . PORTACATH PLACEMENT Left 11/21/2016   Procedure: INSERTION PORT-A-CATH WITH [place];  Surgeon: Fanny Skates, MD;  Location: [facility];  Service: General;  Laterality: Left;     SOCIAL HISTORY:  Social History   Social History  . Marital status: Married    Spouse name: N/A  . Number of children: N/A  . Years of education: N/A   Occupational History  . Not on file.   Social History Main Topics  . Smoking status: Former Smoker    Quit date: 1984  . Smokeless tobacco: Never Used  . Alcohol use No  . Drug use: No  . Sexual activity: Not on file   Other Topics Concern  . Not on file   Social History Narrative  . No narrative on file    FAMILY HISTORY:  Family History  Problem Relation Age of Onset  . Leukemia Mother   . Emphysema Father     CURRENT MEDICATIONS:  Outpatient Encounter Prescriptions as of 09/12/2017  Medication Sig  . ALPRAZolam (XANAX) 0.25 MG tablet Take 2 tablets as needed three times a day  . baclofen (LIORESAL) 10 MG tablet Take 1 tablet by mouth twice daily as needed for hiccups.  .  Homeopathic Products (SIMILASAN DRY EYE RELIEF OP) Apply 1 drop to eye daily.  . magnesium gluconate (MAGONATE) 500 MG tablet Take 500 mg by mouth every evening.   . Multiple Vitamins-Minerals (MULTIVITAMIN WITH MINERALS) tablet Take 1 tablet by mouth daily.  . ondansetron (ZOFRAN) 8 MG tablet Take 1 tablet (8 mg total) by mouth every 8 (eight) hours as needed for refractory nausea / vomiting.  Marland Kitchen oxyCODONE (OXY IR/ROXICODONE) 5 MG immediate release tablet Take 1 tablet (5 mg total) by mouth every 4 (four) hours as needed for severe pain.  . polyethylene glycol powder (MIRALAX) powder Take 1 capful daily.  . Probiotic CAPS Take 1 capsule by mouth every evening.  . senna (SENOKOT) 8.6 MG TABS tablet Take 2 tablets by mouth daily.  . traMADol (ULTRAM) 50 MG tablet Take 1 tablet (50 mg total) by mouth 2 (two) times daily as needed.  . vitamin C (ASCORBIC ACID) 500 MG tablet Take 500 mg by mouth daily.  Marland Kitchen allopurinol (ZYLOPRIM) 300 MG tablet TAKE 1 TABLET(300 MG) BY MOUTH DAILY (Patient not taking: Reported on 09/12/2017)  . BANOPHEN 12.5 MG/5ML liquid   . Diphenhyd-Hydrocort-Nystatin (FIRST-DUKES MOUTHWASH) SUSP Diphenhydramine 12.5 mg/5 mL 1 part, Viscous Lidicaine 2% 1 part, Maalox 1 part, Swish and swallow 5 mL no more than every 4 hours (Patient not taking: Reported on 09/12/2017)  . escitalopram (LEXAPRO) 20 MG tablet TAKE 1/2 TABLET BY MOUTH DAILY FOR 7 DAYS THEN INCREASE TO 1 TABLET BY MOUTH DAILY THEREAFTER  . ibuprofen (ADVIL,MOTRIN) 800 MG tablet Take 800 mg by mouth every 8 (eight) hours as needed for pain.  Marland Kitchen lidocaine-prilocaine (EMLA) cream Apply to affected area once (Patient not taking: Reported on 09/12/2017)  . meclizine (ANTIVERT) 25 MG tablet Take 2 tablets (50 mg total) by mouth 2 (two) times daily as needed for dizziness. (Patient not taking: Reported on 09/12/2017)  . prochlorperazine (COMPAZINE) 10 MG tablet Take 1 tablet (10 mg total) by mouth every 6 (six) hours as needed  (Nausea or vomiting). (Patient not taking: Reported on 09/12/2017)  . [DISCONTINUED] escitalopram (LEXAPRO) 20 MG tablet TAKE 1/2 TABLET BY MOUTH DAILY FOR 7 DAYS THEN INCREASE TO 1 TABLET BY MOUTH DAILY THEREAFTER (  Patient not taking: Reported on 09/12/2017)   No facility-administered encounter medications on file as of 09/12/2017.     ALLERGIES:  Allergies  Allergen Reactions  . Acetaminophen Other (See Comments)    GI upset  . Codeine Other (See Comments)    GI UPSET  . Dye Fdc Red [Red Dye] Other (See Comments)    HEADACHES  . Shellfish Allergy Hives and Swelling    SWELLING OF THROAT  . Sulfa Antibiotics Other (See Comments)    GI UPSET  . Zanaflex [Tizanidine Hcl] Other (See Comments)    GI UPSET   . Adhesive [Tape] Other (See Comments)    SKIN IRRITATION     PHYSICAL EXAM:  ECOG Performance status: 1 - Symptomatic, but independent.   Vitals:   09/12/17 1039  BP: 131/73  Pulse: 81  Resp: 18  Temp: 98.5 F (36.9 C)  SpO2: 99%   Filed Weights   09/12/17 1039  Weight: 126 lb 6.4 oz (57.3 kg)     Physical Exam  Constitutional: She is oriented to person, place, and time and well-developed, well-nourished, and in no distress.  HENT:  Head: Normocephalic.  Mouth/Throat: Oropharynx is clear and moist. No oropharyngeal exudate.  Eyes: Pupils are equal, round, and reactive to light. Conjunctivae are normal. No scleral icterus.  Neck: Normal range of motion. Neck supple.  Cardiovascular: Normal rate and regular rhythm.   Pulmonary/Chest: Effort normal and breath sounds normal. No respiratory distress. She has no wheezes.    Abdominal: Soft. Bowel sounds are normal. There is no tenderness.  Musculoskeletal: Normal range of motion. She exhibits no edema.  Lymphadenopathy:    She has no cervical adenopathy.       Right: No inguinal and no supraclavicular adenopathy present.       Left: No inguinal and no supraclavicular adenopathy present.  Neurological: She is  alert and oriented to person, place, and time. No cranial nerve deficit. Gait normal.  Skin: Skin is warm and dry. No rash noted.  Psychiatric: Memory and judgment normal.  Anxious affect; fast speech at times  Nursing note and vitals reviewed.  *Patient requested breast exam today not because of any specific concern but she was anxious and stated, "I just want to make sure everything is okay."    LABORATORY DATA:  I have reviewed the labs as listed.  CBC    Component Value Date/Time   WBC 4.1 09/12/2017 1119   RBC 3.82 (L) 09/12/2017 1119   HGB 12.2 09/12/2017 1119   HCT 35.4 (L) 09/12/2017 1119   PLT 162 09/12/2017 1119   MCV 92.7 09/12/2017 1119   MCH 31.9 09/12/2017 1119   MCHC 34.5 09/12/2017 1119   RDW 12.9 09/12/2017 1119   LYMPHSABS 0.7 09/12/2017 1119   MONOABS 0.5 09/12/2017 1119   EOSABS 0.1 09/12/2017 1119   BASOSABS 0.0 09/12/2017 1119   CMP Latest Ref Rng & Units 09/12/2017 06/13/2017 05/02/2017  Glucose 65 - 99 mg/dL 94 88 108(H)  BUN 6 - 20 mg/dL '18 18 17  '$ Creatinine 0.44 - 1.00 mg/dL 0.48 0.54 0.52  Sodium 135 - 145 mmol/L 137 133(L) 134(L)  Potassium 3.5 - 5.1 mmol/L 4.1 4.3 4.0  Chloride 101 - 111 mmol/L 102 98(L) 100(L)  CO2 22 - 32 mmol/L '25 28 25  '$ Calcium 8.9 - 10.3 mg/dL 9.4 9.6 9.3  Total Protein 6.5 - 8.1 g/dL 7.5 7.6 7.4  Total Bilirubin 0.3 - 1.2 mg/dL 0.8 0.8 0.6  Alkaline Phos 38 -  126 U/L 63 57 55  AST 15 - 41 U/L '22 31 28  '$ ALT 14 - 54 U/L '16 24 20    '$ PENDING LABS:    DIAGNOSTIC IMAGING:  *Diagnostic images reviewed and agree with below reports as listed.  Last PET scan: 04/29/17 CLINICAL DATA:  Subsequent treatment strategy for stage IV diffuse large B-cell lymphoma diagnosed 10/31/2016 status post completion of chemotherapy.  EXAM: NUCLEAR MEDICINE PET SKULL BASE TO THIGH  TECHNIQUE: 5.9 mCi F-18 FDG was injected intravenously. Full-ring PET imaging was performed from the skull base to thigh after the radiotracer. CT data was  obtained and used for attenuation correction and anatomic localization.  FASTING BLOOD GLUCOSE:  Value: 104 mg/dl  COMPARISON:  01/28/2017 PET-CT.  FINDINGS: NECK  No hypermetabolic lymph nodes in the neck.  Persistent diffuse thyroid hypermetabolism with heterogeneous attenuation on CT images, without discrete thyroid nodules, max SUV 8.4, previous max SUV 11.9.  CHEST  No enlarged or hypermetabolic axillary, mediastinal or hilar nodes.  Left subclavian MediPort terminates at the cavoatrial junction. Left anterior descending coronary atherosclerosis. Mildly atherosclerotic nonaneurysmal thoracic aorta. No pneumothorax. No pleural effusions. Mild centrilobular and paraseptal emphysema with mild diffuse bronchial wall thickening. Scattered calcified subcentimeter granulomas throughout both lungs, unchanged. No acute consolidative airspace disease or new significant pulmonary nodules.  ABDOMEN/PELVIS  No hypermetabolic lymph nodes in the abdomen or pelvis.  Mildly enlarged 1.2 cm right external iliac node (series 4/ image 157) is non hypermetabolic (max SUV 2.2, below the mediastinal blood pool activity) and decreased in size from 2.0 cm on 01/28/2017.  No abnormal hypermetabolic activity within the liver, pancreas, adrenal glands, or spleen.  Atherosclerotic nonaneurysmal abdominal aorta. Simple 1.2 cm anterior liver cyst. Additional scattered subcentimeter hypodense liver lesions are too small to characterize and not appreciably changed, suggesting benign lesions. No new liver lesions. Stable 1.3 cm right adrenal adenoma. Stable exophytic 1.2 cm mildly hyperdense renal cortical lesion in the posterior upper left kidney.  SKELETON  No focal hypermetabolic activity to suggest skeletal metastasis.  IMPRESSION: 1. No metabolically active lymphoma. Mildly enlarged non hypermetabolic right external iliac lymph node is decreased in size, compatible with  treated lymphoma. 2. Persistent diffuse thyroid hypermetabolism with heterogeneous attenuation on the CT images without discrete thyroid nodules, more suggestive of nonspecific diffuse thyroiditis. Recommend correlation with serum thyroid function tests. Consider thyroid ultrasound correlation. 3. Additional findings include aortic atherosclerosis, 1 vessel coronary atherosclerosis, mild emphysema and stable right adrenal adenoma.   Electronically Signed   By: Ilona Sorrel M.D.   On: 04/29/2017 11:46      PATHOLOGY:  Cervical LN biopsy: 10/31/16    (R) breast biopsy: 11/16/16           ASSESSMENT & PLAN:   Stage IV diffuse large B-cell lymphoma (DLBCL):  -Diagnosed in 09/2016; negative bone marrow biopsy/aspiration.  S/p R-CHOP chemotherapy x 6 cycles.  Completed systemic chemo on 03/14/17. Received 2 cycles of prophylactic IT methotrexate; she refused subsequent cycles d/t side effects. Post-treatment PET scan in 04/2017 demonstrated complete response and no evidence of disease.  -Clinically, she remains NED today on physical exam. Her labs were reviewed in detail with her today and are stable.  -Discussed continued surveillance with routine imaging due in 10/2017. Discussed PET/CT with her; she is reluctant to undergo any additional PET scans. She prefers to have CT scans instead.  Therefore, CT chest/abd/pelvis and neck ordered today.   -Return to cancer center in mid-January for follow-up with labs with  MD.    Anxiety and depression: -Discussed that I suspect much of her reported irritability, emotional lability, and increased anxiety is due to her stopping the Lexapro. We discussed the role SSRIs play in her care; she is able to recognize that her mood was more stable and she felt "more normal" while taking the Lexapro.  Provided reassurance that it appears that since her mood was more stable on the Lexapro, I would recommend she resume taking it.  She is concerned  about "long-term side effects and damange from taking these medicines."  Shared with her that there is very limited, if any, good data suggesting that lonog-term use of SSRIs causes harm. Actually, there is tremendous data supporting the use of SSRIs for anxiety/depressions as morbidity and mortality are both greater without adequate treatment of mental illness.  She tells me, "I just want to feel better and more like myself again." I acknowledged her challenges and provided reassurance, as well as support with active listening.  She agrees to resume Lexapro.  -Since it has been nearly 6 months since she stopped the medication, I recommended a short-interval titration of dose; take 10 mg (1/2 tab) x 1 week, then increase to 1 whole tab daily thereafter.  Encouraged her to let us know if she has any undesirable or adverse side effects.  She voiced understanding and appreciation.   Health maintenance:  -Annual flu vaccine given today; administered by nursing.  -Recommended continued follow-up with PCP as directed.         Dispo:  -CT chest/abd/pelvis and neck in late 10/2017; orders placed today.  -Return to cancer center in mid-January for follow-up with MD with labs.    All questions were answered to patient's stated satisfaction. Encouraged patient to call with any new concerns or questions before her next visit to the cancer center and we can certain see her sooner, if needed.    Plan of care discussed with Dr. Talbert Cage, who agrees with the above aforementioned.   A total of 30 minutes was spent in face-to-face care of this patient, with greater than 50% of that time spent in counseling and care-coordination.      Orders placed this encounter:  Orders Placed This Encounter  Procedures  . CT SOFT TISSUE NECK W CONTRAST  . CT Chest W Contrast  . CT Abdomen Pelvis W Contrast      Mike Craze, NP [place] [phone]

## 2017-09-12 NOTE — Patient Instructions (Signed)
Monica Gray at [facility] Discharge Instructions  RECOMMENDATIONS MADE BY THE CONSULTANT AND ANY TEST RESULTS WILL BE SENT TO YOUR REFERRING PHYSICIAN.  You were seen today by Gretchen Dawson NP.   Thank you for choosing [facility] at [facility] to provide your oncology and hematology care.  To afford each patient quality time with our provider, please arrive at least 15 minutes before your scheduled appointment time.    If you have a lab appointment with the [facility] please come in thru the  Main Entrance and check in at the main information desk  You need to re-schedule your appointment should you arrive 10 or more minutes late.  We strive to give you quality time with our providers, and arriving late affects you and other patients whose appointments are after yours.  Also, if you no show three or more times for appointments you may be dismissed from the clinic at the providers discretion.     Again, thank you for choosing [facility].  Our hope is that these requests will decrease the amount of time that you wait before being seen by our physicians.       _____________________________________________________________  Should you have questions after your visit to [facility], please contact our office at [phone] between the hours of 8:30 a.m. and 4:30 p.m.  Voicemails left after 4:30 p.m. will not be returned until the following business day.  For prescription refill requests, have your pharmacy contact our office.       Resources For Cancer Patients and their Caregivers ? American Cancer Society: Can assist with transportation, wigs, general needs, runs Look Good Feel Better.        1-888-227-6333 ? Cancer Care: Provides financial assistance, online support groups, medication/co-pay assistance.  1-800-813-HOPE (4673) ? [facility] Assists [place] cancer patients and  their families through emotional , educational and financial support.  336-427-4357 ? [place] DSS Where to apply for food stamps, Medicaid and utility assistance. 336-342-1394 ? RCATS: Transportation to medical appointments. 336-347-2287 ? Social Security Administration: May apply for disability if have a Stage IV cancer. 336-342-7796 1-800-772-1213 ? [place] Aging, Disability and Transit Services: Assists with nutrition, care and transit needs. 336-349-2343  [facility] Support Programs: @[email or web address]@ > Cancer Support Group  2nd Tuesday of the month 1pm-2pm, Journey Room  > Creative Journey  3rd Tuesday of the month 1130am-1pm, Journey Room  > Look Good Feel Better  1st Wednesday of the month 10am-12 noon, Journey Room (Call American Cancer Society to register 1-800-395-5775)    

## 2017-09-17 ENCOUNTER — Telehealth (HOSPITAL_COMMUNITY): Payer: Self-pay | Admitting: *Deleted

## 2017-09-17 NOTE — Telephone Encounter (Signed)
spoke with patient via telephone, she called to ask about her recent lab values. I informed her that all lab values were WNL.  patient verbalizes understanding.

## 2017-11-04 ENCOUNTER — Other Ambulatory Visit: Payer: Self-pay

## 2017-11-04 ENCOUNTER — Encounter (HOSPITAL_COMMUNITY): Payer: Self-pay

## 2017-11-04 ENCOUNTER — Encounter (HOSPITAL_COMMUNITY): Payer: Medicare Other | Attending: Adult Health

## 2017-11-04 DIAGNOSIS — Z87891 Personal history of nicotine dependence: Secondary | ICD-10-CM | POA: Diagnosis not present

## 2017-11-04 DIAGNOSIS — J439 Emphysema, unspecified: Secondary | ICD-10-CM | POA: Insufficient documentation

## 2017-11-04 DIAGNOSIS — G629 Polyneuropathy, unspecified: Secondary | ICD-10-CM | POA: Insufficient documentation

## 2017-11-04 DIAGNOSIS — Z791 Long term (current) use of non-steroidal anti-inflammatories (NSAID): Secondary | ICD-10-CM | POA: Diagnosis not present

## 2017-11-04 DIAGNOSIS — R531 Weakness: Secondary | ICD-10-CM | POA: Insufficient documentation

## 2017-11-04 DIAGNOSIS — Z9221 Personal history of antineoplastic chemotherapy: Secondary | ICD-10-CM | POA: Insufficient documentation

## 2017-11-04 DIAGNOSIS — Z882 Allergy status to sulfonamides status: Secondary | ICD-10-CM | POA: Diagnosis not present

## 2017-11-04 DIAGNOSIS — I700 Atherosclerosis of aorta: Secondary | ICD-10-CM | POA: Insufficient documentation

## 2017-11-04 DIAGNOSIS — D3501 Benign neoplasm of right adrenal gland: Secondary | ICD-10-CM | POA: Diagnosis not present

## 2017-11-04 DIAGNOSIS — Z79891 Long term (current) use of opiate analgesic: Secondary | ICD-10-CM | POA: Diagnosis not present

## 2017-11-04 DIAGNOSIS — Z888 Allergy status to other drugs, medicaments and biological substances status: Secondary | ICD-10-CM | POA: Diagnosis not present

## 2017-11-04 DIAGNOSIS — J45909 Unspecified asthma, uncomplicated: Secondary | ICD-10-CM | POA: Diagnosis not present

## 2017-11-04 DIAGNOSIS — I2510 Atherosclerotic heart disease of native coronary artery without angina pectoris: Secondary | ICD-10-CM | POA: Diagnosis not present

## 2017-11-04 DIAGNOSIS — Z8572 Personal history of non-Hodgkin lymphomas: Secondary | ICD-10-CM | POA: Diagnosis present

## 2017-11-04 DIAGNOSIS — Z9889 Other specified postprocedural states: Secondary | ICD-10-CM | POA: Insufficient documentation

## 2017-11-04 DIAGNOSIS — Z91048 Other nonmedicinal substance allergy status: Secondary | ICD-10-CM | POA: Insufficient documentation

## 2017-11-04 DIAGNOSIS — Z79899 Other long term (current) drug therapy: Secondary | ICD-10-CM | POA: Diagnosis not present

## 2017-11-04 DIAGNOSIS — C8330 Diffuse large B-cell lymphoma, unspecified site: Secondary | ICD-10-CM | POA: Insufficient documentation

## 2017-11-04 DIAGNOSIS — Z825 Family history of asthma and other chronic lower respiratory diseases: Secondary | ICD-10-CM | POA: Diagnosis not present

## 2017-11-04 DIAGNOSIS — F419 Anxiety disorder, unspecified: Secondary | ICD-10-CM | POA: Insufficient documentation

## 2017-11-04 DIAGNOSIS — Z886 Allergy status to analgesic agent status: Secondary | ICD-10-CM | POA: Diagnosis not present

## 2017-11-04 DIAGNOSIS — B1920 Unspecified viral hepatitis C without hepatic coma: Secondary | ICD-10-CM | POA: Diagnosis not present

## 2017-11-04 DIAGNOSIS — Z885 Allergy status to narcotic agent status: Secondary | ICD-10-CM | POA: Diagnosis not present

## 2017-11-04 DIAGNOSIS — Z806 Family history of leukemia: Secondary | ICD-10-CM | POA: Diagnosis not present

## 2017-11-04 DIAGNOSIS — F329 Major depressive disorder, single episode, unspecified: Secondary | ICD-10-CM | POA: Insufficient documentation

## 2017-11-04 DIAGNOSIS — Z91013 Allergy to seafood: Secondary | ICD-10-CM | POA: Insufficient documentation

## 2017-11-04 DIAGNOSIS — C8338 Diffuse large B-cell lymphoma, lymph nodes of multiple sites: Secondary | ICD-10-CM

## 2017-11-04 DIAGNOSIS — Z23 Encounter for immunization: Secondary | ICD-10-CM | POA: Insufficient documentation

## 2017-11-04 LAB — CBC WITH DIFFERENTIAL/PLATELET
BASOS PCT: 1 %
Basophils Absolute: 0 10*3/uL (ref 0.0–0.1)
EOS ABS: 0.1 10*3/uL (ref 0.0–0.7)
Eosinophils Relative: 1 %
HEMATOCRIT: 36.4 % (ref 36.0–46.0)
HEMOGLOBIN: 12.2 g/dL (ref 12.0–15.0)
LYMPHS ABS: 0.9 10*3/uL (ref 0.7–4.0)
Lymphocytes Relative: 21 %
MCH: 30.8 pg (ref 26.0–34.0)
MCHC: 33.5 g/dL (ref 30.0–36.0)
MCV: 91.9 fL (ref 78.0–100.0)
MONO ABS: 0.3 10*3/uL (ref 0.1–1.0)
MONOS PCT: 7 %
NEUTROS PCT: 70 %
Neutro Abs: 3 10*3/uL (ref 1.7–7.7)
Platelets: 170 10*3/uL (ref 150–400)
RBC: 3.96 MIL/uL (ref 3.87–5.11)
RDW: 12.6 % (ref 11.5–15.5)
WBC: 4.3 10*3/uL (ref 4.0–10.5)

## 2017-11-04 LAB — COMPREHENSIVE METABOLIC PANEL
ALBUMIN: 4.5 g/dL (ref 3.5–5.0)
ALK PHOS: 57 U/L (ref 38–126)
ALT: 20 U/L (ref 14–54)
AST: 27 U/L (ref 15–41)
Anion gap: 7 (ref 5–15)
BUN: 19 mg/dL (ref 6–20)
CALCIUM: 9.8 mg/dL (ref 8.9–10.3)
CHLORIDE: 102 mmol/L (ref 101–111)
CO2: 28 mmol/L (ref 22–32)
CREATININE: 0.59 mg/dL (ref 0.44–1.00)
GFR calc Af Amer: >60 mL/min (ref >60)
GFR calc non Af Amer: >60 mL/min (ref >60)
GLUCOSE: 121 mg/dL — AB (ref 65–99)
Potassium: 4.4 mmol/L (ref 3.5–5.1)
SODIUM: 137 mmol/L (ref 135–145)
Total Bilirubin: 0.9 mg/dL (ref 0.3–1.2)
Total Protein: 7.8 g/dL (ref 6.5–8.1)

## 2017-11-04 LAB — LACTATE DEHYDROGENASE: LDH: 134 U/L (ref 98–192)

## 2017-11-04 MED ORDER — HEPARIN SOD (PORK) LOCK FLUSH 100 UNIT/ML IV SOLN
500.0000 [IU] | Freq: Once | INTRAVENOUS | Status: AC
  Administered 2017-11-04: 500 [IU] via INTRAVENOUS

## 2017-11-04 MED ORDER — SODIUM CHLORIDE 0.9% FLUSH
10.0000 mL | INTRAVENOUS | Status: DC | PRN
  Administered 2017-11-04: 10 mL via INTRAVENOUS
  Filled 2017-11-04: qty 10

## 2017-11-04 MED ORDER — BALNEOL EX LOTN: TOPICAL_LOTION | CUTANEOUS | 0 refills | Status: DC

## 2017-11-04 NOTE — Addendum Note (Signed)
Addended by: Donetta Potts on: 11/04/2017 12:44 PM   Modules accepted: Orders

## 2017-11-04 NOTE — Patient Instructions (Signed)
Cancer Center at [facility] Discharge Instructions  RECOMMENDATIONS MADE BY THE CONSULTANT AND ANY TEST RESULTS WILL BE SENT TO YOUR REFERRING PHYSICIAN.  You had your port flushed today. Follow up as scheduled.  Thank you for choosing  Cancer Center at [facility] to provide your oncology and hematology care.  To afford each patient quality time with our provider, please arrive at least 15 minutes before your scheduled appointment time.    If you have a lab appointment with the Cancer Center please come in thru the  Main Entrance and check in at the main information desk  You need to re-schedule your appointment should you arrive 10 or more minutes late.  We strive to give you quality time with our providers, and arriving late affects you and other patients whose appointments are after yours.  Also, if you no show three or more times for appointments you may be dismissed from the clinic at the providers discretion.     Again, thank you for choosing [facility].  Our hope is that these requests will decrease the amount of time that you wait before being seen by our physicians.       _____________________________________________________________  Should you have questions after your visit to [facility], please contact our office at [phone] between the hours of 8:30 a.m. and 4:30 p.m.  Voicemails left after 4:30 p.m. will not be returned until the following business day.  For prescription refill requests, have your pharmacy contact our office.       Resources For Cancer Patients and their Caregivers ? American Cancer Society: Can assist with transportation, wigs, general needs, runs Look Good Feel Better.        1-888-227-6333 ? Cancer Care: Provides financial assistance, online support groups, medication/co-pay assistance.  1-800-813-HOPE (4673) ? [facility] Assists [place] cancer  patients and their families through emotional , educational and financial support.  336-427-4357 ? [place] DSS Where to apply for food stamps, Medicaid and utility assistance. 336-342-1394 ? RCATS: Transportation to medical appointments. 336-347-2287 ? Social Security Administration: May apply for disability if have a Stage IV cancer. 336-342-7796 1-800-772-1213 ? [place] Aging, Disability and Transit Services: Assists with nutrition, care and transit needs. 336-349-2343  Cancer Center Support Programs: @[email or web address]@ > Cancer Support Group  2nd Tuesday of the month 1pm-2pm, Journey Room  > Creative Journey  3rd Tuesday of the month 1130am-1pm, Journey Room  > Look Good Feel Better  1st Wednesday of the month 10am-12 noon, Journey Room (Call American Cancer Society to register 1-800-395-5775)    

## 2017-11-04 NOTE — Progress Notes (Signed)
Monica Gray presented for Portacath access and flush. Portacath located left chest wall accessed with  H 20 needle. Good blood return present and labs drawn from port. Portacath flushed with 47ml NS and 500U/59ml Heparin and needle removed intact. Procedure without incident. Patient tolerated procedure well. Patient tolerated treatment without incidence. Patient discharged ambulatory and in stable condition from clinic. Patient to follow up as scheduled.

## 2017-11-05 ENCOUNTER — Ambulatory Visit (HOSPITAL_COMMUNITY)
Admission: RE | Admit: 2017-11-05 | Discharge: 2017-11-05 | Disposition: A | Discharge status: Home / Self Care | Payer: Medicare Other | Source: Ambulatory Visit | Attending: Adult Health | Admitting: Adult Health

## 2017-11-05 DIAGNOSIS — C8338 Diffuse large B-cell lymphoma, lymph nodes of multiple sites: Secondary | ICD-10-CM

## 2017-11-05 DIAGNOSIS — I2510 Atherosclerotic heart disease of native coronary artery without angina pectoris: Secondary | ICD-10-CM | POA: Insufficient documentation

## 2017-11-05 DIAGNOSIS — E041 Nontoxic single thyroid nodule: Secondary | ICD-10-CM | POA: Insufficient documentation

## 2017-11-05 DIAGNOSIS — D3501 Benign neoplasm of right adrenal gland: Secondary | ICD-10-CM | POA: Insufficient documentation

## 2017-11-05 DIAGNOSIS — M40204 Unspecified kyphosis, thoracic region: Secondary | ICD-10-CM | POA: Insufficient documentation

## 2017-11-05 DIAGNOSIS — K7689 Other specified diseases of liver: Secondary | ICD-10-CM | POA: Diagnosis not present

## 2017-11-05 DIAGNOSIS — I700 Atherosclerosis of aorta: Secondary | ICD-10-CM | POA: Diagnosis not present

## 2017-11-05 DIAGNOSIS — C8330 Diffuse large B-cell lymphoma, unspecified site: Secondary | ICD-10-CM | POA: Diagnosis not present

## 2017-11-05 MED ORDER — IOPAMIDOL (ISOVUE-300) INJECTION 61%
125.0000 mL | Freq: Once | INTRAVENOUS | Status: AC | PRN
  Administered 2017-11-05: 125 mL via INTRAVENOUS

## 2017-11-06 ENCOUNTER — Telehealth (HOSPITAL_COMMUNITY): Payer: Self-pay | Admitting: Adult Health

## 2017-11-06 NOTE — Telephone Encounter (Signed)
Reviewed results with Dr. Thornton Papas from radiology via phone today.  I wanted to ensure the soft tissue density noted at the (R) porta hepatis, as well as the very small 8 mm (R) external iliac LN were not concerning for residual disease.  Dr. Thornton Papas reviewed all previous imaging with me over the phone.  Based on his assessment/review, the soft tissue density is likely treatment-related changes (as it was not hypermetabolic on PET in 12/9572); not likely to be residual or recurrent disease at this time.  The (R) iliac LN is reducing in size from previous scans and does not meet size criteria for recurrence; given that it is improving and patient has not had treatment in the interim, favor benign etiology.     I called Ms. Fehr and discussed CT neck/chest/abd/pelvis results with her over the phone.  Will review in more detail at her upcoming follow-up appointment in January.  She tells me that she is feeling well and very grateful for the good news. Encouraged her to call us if she has any new or concerning symptoms and we could see her sooner if needed. She voiced appreciation for the call.    Mike Craze, NP [place] [phone]    NCCN Guidelines for DLBCL reviewed:

## 2017-12-05 ENCOUNTER — Encounter (HOSPITAL_COMMUNITY): Payer: Self-pay | Admitting: Internal Medicine

## 2017-12-05 ENCOUNTER — Inpatient Hospital Stay (HOSPITAL_COMMUNITY): Payer: Medicare Other | Attending: Internal Medicine | Admitting: Internal Medicine

## 2017-12-05 ENCOUNTER — Other Ambulatory Visit: Payer: Self-pay

## 2017-12-05 ENCOUNTER — Inpatient Hospital Stay (HOSPITAL_COMMUNITY): Payer: Medicare Other

## 2017-12-05 VITALS — BP 128/68 | HR 73 | Temp 98.0°F | Resp 16 | Ht 62.0 in | Wt 130.0 lb

## 2017-12-05 DIAGNOSIS — E041 Nontoxic single thyroid nodule: Secondary | ICD-10-CM | POA: Insufficient documentation

## 2017-12-05 DIAGNOSIS — C8338 Diffuse large B-cell lymphoma, lymph nodes of multiple sites: Secondary | ICD-10-CM | POA: Insufficient documentation

## 2017-12-05 DIAGNOSIS — F418 Other specified anxiety disorders: Secondary | ICD-10-CM

## 2017-12-05 LAB — VITAMIN B12: Vitamin B-12: 1321 pg/mL — ABNORMAL HIGH (ref 180–914)

## 2017-12-05 LAB — TSH: TSH: 1.145 u[IU]/mL (ref 0.350–4.500)

## 2017-12-05 MED ORDER — HEPARIN SOD (PORK) LOCK FLUSH 100 UNIT/ML IV SOLN
500.0000 [IU] | Freq: Once | INTRAVENOUS | Status: AC
  Administered 2017-12-05: 500 [IU] via INTRAVENOUS

## 2017-12-05 MED ORDER — ESCITALOPRAM OXALATE 20 MG PO TABS: ORAL_TABLET | ORAL | 3 refills | Status: DC

## 2017-12-05 MED ORDER — SODIUM CHLORIDE 0.9% FLUSH
10.0000 mL | INTRAVENOUS | Status: DC | PRN
  Administered 2017-12-05: 10 mL via INTRAVENOUS
  Filled 2017-12-05: qty 10

## 2017-12-05 NOTE — Progress Notes (Signed)
Monica Gray presented for Portacath access and flush.  Portacath located left chest wall accessed with  H 20 needle.  Good blood return present. Portacath flushed with 79ml NS and 500U/13ml Heparin and needle removed intact.  Procedure tolerated well and without incident.  Discharged ambulatory.

## 2017-12-05 NOTE — Progress Notes (Signed)
Chief complaint: Monica Gray returns today( 12/16/17) for follow-up for diffuse large B cell lymphoma.  She was last seen by Dr. Talbert Cage on 06/13/2017 at the [facility].  History of present illness:She was first diagnosed with stage IV diffuse large B-cell lymphoma in November 2017 when she presented with a right neck nodal mass and multiple paraspinal masses in the posterior chest.  She also had breast involvement with a biopsy confirming this to be diffuse large B-cell lymphoma.  She received 6 cycles of R CHOP chemotherapy between January 2018 through April 2018. A bone marrow aspiration biopsy prior to treatment initiation was negative.  She received 2 doses of intrathecal methotrexate between May and June 2018.  Further doses were stopped because of poor tolerance. CT scan from December 2017 showed extensive adenopathy within the chest abdomen pelvis bilateral pleural masses omental nodularity and kidney lesions consistent with lymphoma she also had bilateral pleural effusions and a well-circumscribed L1 vertebral lucent lesion that was indeterminate.  A follow-up PET scan from June 2018 showed resolution of hypermetabolism and resolution of lymphadenopathy indicating excellent response.  However in June 2018 she showed uptake within the thyroid associated with the hypermetabolic nodule.  Biopsy of this nodule was consistent with benign follicular nodule.   Interval history:12/16/2017 Today she returns in good spirits she states her energy and activity level have been consistently improving however she feels she is only 80% back to her normal self.  She is limited mostly by tiredness and has had episodes of minor falls due to loss of balance.  Denies any night sweats fevers cough difficulty breathing no frequent infections etc.She also feels that her memory has gotten slightly worse.  At her last visit she had been depressed and it was felt that she was withdrawing from [place].  Since then  she had begun Lexapro back at 20 mg and this has been very helpful she no longer has feelings of depression and anxiety.    Review of systems: As in HPI, otherwise unremarkable.  Physical exam:Physical Exam  Constitutional: She is oriented to person, place, and time. She appears well-developed and well-nourished.  HENT:  Head: Normocephalic and atraumatic.  Mouth/Throat: No oropharyngeal exudate.  Eyes: Conjunctivae and EOM are normal. Pupils are equal, round, and reactive to light.  Neck: Normal range of motion. Neck supple. No thyromegaly present.  Cardiovascular: Normal rate and regular rhythm.  Pulmonary/Chest: Effort normal.  Abdominal: Soft. She exhibits no mass.  Lymphadenopathy:    She has no cervical adenopathy.  Neurological: She is alert and oriented to person, place, and time. She has normal strength. She displays no atrophy and no tremor. No cranial nerve deficit or sensory deficit. She exhibits normal muscle tone. Coordination and gait normal.  Vitals reviewed. No lymphadenopathy was appreciated in the neck, axillary , inguinal areas  Results review: A PET scan from December 2018 has been reviewed and that shows no evidence of disease recurrence indicating continued complete remission.  On the CT soft tissue neck from 12/18, a 9 mm right thyroid nodule appears stable.  CBC CMP and LDH are normal today.  Impression and plan:  -DLBCL- presented with extensive adenopathy within the chest abdomen pelvis, bilateral pleural masses, omental mets and kidney lesions consistent with lymphoma She also had cervical adenopathy and breast involvement with lymphoma. L1 vertebral lucent lesion that was indeterminate. NED today on exam, but high risk of recurrence.  -Thyroid nodule- stable  -Depression- well controlled  -Falls due to unstable  gait with a non focal neuro exam. Patient declined further evaluation, if persists, she will need MR brain.  -I recommended a neurology  consultation and MR brain due to complaints of falls and gait instability.  Patient says however that she has not fallen in nearly a month and does not wish to see a neurologist. She does not want to use any walker. Fall precautions have been advised.   --Physical therapy evaluation was also declined. -We will also arrange for bilateral diagnostic mammogram to assess the status of the disease within the breast   -CBC CMP LDH and TSH will be requested at her follow-up visit.  -Port flush every 2 months.   -Given the high risk nature of her disease at like to keep the port for at least another year. - I would like her to have a short interval follow up in  2 months at the time of her port flush to review her symptoms of fall and unstable gait. If resolved, plan on CT chest/abd/pelvis in 5 months.   Patient was asked to call us if her symptoms worsen in the inter-  She requested 90-day prescription for Lexapro with refills she just filled a 30-day supply.

## 2017-12-11 ENCOUNTER — Other Ambulatory Visit (HOSPITAL_COMMUNITY): Payer: Self-pay | Admitting: Internal Medicine

## 2017-12-11 DIAGNOSIS — C8338 Diffuse large B-cell lymphoma, lymph nodes of multiple sites: Secondary | ICD-10-CM

## 2017-12-16 ENCOUNTER — Other Ambulatory Visit (HOSPITAL_COMMUNITY): Payer: Self-pay | Admitting: *Deleted

## 2017-12-16 DIAGNOSIS — C8338 Diffuse large B-cell lymphoma, lymph nodes of multiple sites: Secondary | ICD-10-CM

## 2017-12-17 ENCOUNTER — Telehealth (HOSPITAL_COMMUNITY): Payer: Self-pay

## 2017-12-17 NOTE — Telephone Encounter (Signed)
Patient called stating she did not know why she was scheduled for an MRI. After reviewing MD notes, I explained to patient that because of her recent falls and gait instability that the doctor wanted her to have an MRI. patient states she does not want an MRI. She is coping with her gait problems and using a walker and wants to refuse the MRI at this time. I explained to patient that if her gait problems and falls continue then she needs to reconsider having the MRI. Patient verbalized understanding.

## 2017-12-19 ENCOUNTER — Other Ambulatory Visit (HOSPITAL_COMMUNITY): Payer: Medicare Other

## 2017-12-20 ENCOUNTER — Ambulatory Visit (HOSPITAL_COMMUNITY): Payer: Medicare Other

## 2017-12-24 ENCOUNTER — Ambulatory Visit (HOSPITAL_COMMUNITY)
Admission: RE | Admit: 2017-12-24 | Discharge: 2017-12-24 | Disposition: A | Discharge status: Home / Self Care | Payer: Medicare Other | Source: Ambulatory Visit | Attending: Internal Medicine | Admitting: Internal Medicine

## 2017-12-24 ENCOUNTER — Other Ambulatory Visit (HOSPITAL_COMMUNITY): Payer: Self-pay | Admitting: Internal Medicine

## 2017-12-24 DIAGNOSIS — C8338 Diffuse large B-cell lymphoma, lymph nodes of multiple sites: Secondary | ICD-10-CM

## 2017-12-24 DIAGNOSIS — R921 Mammographic calcification found on diagnostic imaging of breast: Secondary | ICD-10-CM | POA: Diagnosis not present

## 2018-01-07 ENCOUNTER — Encounter (HOSPITAL_COMMUNITY): Payer: Medicare Other

## 2018-01-30 ENCOUNTER — Encounter (HOSPITAL_COMMUNITY): Payer: Self-pay

## 2018-01-30 ENCOUNTER — Inpatient Hospital Stay (HOSPITAL_COMMUNITY): Payer: Medicare Other | Attending: Internal Medicine

## 2018-01-30 VITALS — BP 125/82 | HR 73 | Resp 16

## 2018-01-30 DIAGNOSIS — Z452 Encounter for adjustment and management of vascular access device: Secondary | ICD-10-CM | POA: Diagnosis not present

## 2018-01-30 DIAGNOSIS — C8338 Diffuse large B-cell lymphoma, lymph nodes of multiple sites: Secondary | ICD-10-CM

## 2018-01-30 MED ORDER — SODIUM CHLORIDE 0.9% FLUSH
10.0000 mL | Freq: Once | INTRAVENOUS | Status: AC
  Administered 2018-01-30: 10 mL via INTRAVENOUS

## 2018-01-30 MED ORDER — HEPARIN SOD (PORK) LOCK FLUSH 100 UNIT/ML IV SOLN
500.0000 [IU] | Freq: Once | INTRAVENOUS | Status: AC
  Administered 2018-01-30: 500 [IU] via INTRAVENOUS

## 2018-01-30 NOTE — Patient Instructions (Signed)
[facility]  Discharge Instructions:  Your port was flushed today. _______________________________________________________________  Thank you for choosing [facility] to provide your oncology and hematology care.  To afford each patient quality time with our providers, please arrive at least 15 minutes before your scheduled appointment.  You need to re-schedule your appointment if you arrive 10 or more minutes late.  We strive to give you quality time with our providers, and arriving late affects you and other patients whose appointments are after yours.  Also, if you no show three or more times for appointments you may be dismissed from the clinic.  Again, thank you for choosing [facility]. Our hope is that these requests will allow you access to exceptional care and in a timely manner. _______________________________________________________________  If you have questions after your visit, please contact our office at [phone] between the hours of 8:30 a.m. and 5:00 p.m. Voicemails left after 4:30 p.m. will not be returned until the following business day. _______________________________________________________________  For prescription refill requests, have your pharmacy contact our office. _______________________________________________________________  Recommendations made by the consultant and any test results will be sent to your referring physician. _______________________________________________________________ 

## 2018-01-30 NOTE — Progress Notes (Signed)
Port flushed per protocol.  Port site clean and dry with no bruising or swelling noted at site.  No complaints of pain with flush.  Band aid applied.  VSS with discharge and left ambulatory with no s/s of distress noted.

## 2018-03-27 ENCOUNTER — Other Ambulatory Visit: Payer: Self-pay

## 2018-03-27 ENCOUNTER — Encounter (HOSPITAL_COMMUNITY): Payer: Self-pay

## 2018-03-27 ENCOUNTER — Inpatient Hospital Stay (HOSPITAL_COMMUNITY): Payer: Medicare Other | Attending: Internal Medicine

## 2018-03-27 VITALS — BP 127/77 | HR 73 | Temp 98.1°F | Resp 18

## 2018-03-27 DIAGNOSIS — C8338 Diffuse large B-cell lymphoma, lymph nodes of multiple sites: Secondary | ICD-10-CM | POA: Diagnosis not present

## 2018-03-27 DIAGNOSIS — C8330 Diffuse large B-cell lymphoma, unspecified site: Secondary | ICD-10-CM

## 2018-03-27 MED ORDER — HEPARIN SOD (PORK) LOCK FLUSH 100 UNIT/ML IV SOLN
500.0000 [IU] | Freq: Once | INTRAVENOUS | Status: AC
  Administered 2018-03-27: 500 [IU] via INTRAVENOUS
  Filled 2018-03-27: qty 5

## 2018-03-27 MED ORDER — SODIUM CHLORIDE 0.9% FLUSH
10.0000 mL | INTRAVENOUS | Status: DC | PRN
  Administered 2018-03-27: 10 mL via INTRAVENOUS
  Filled 2018-03-27: qty 10

## 2018-03-27 NOTE — Progress Notes (Signed)
Monica Gray presented for Portacath access and flush. Portacath located left chest wall accessed with  H 20 needle. Good blood return present. Portacath flushed with 79ml NS and 500U/62ml Heparin and needle removed intact. Procedure without incident. Patient tolerated procedure well.  Vitals stable and discharged home from clinic ambulatory. Follow up as scheduled.

## 2018-03-27 NOTE — Patient Instructions (Signed)
Monica Gray at [facility] Discharge Instructions  Port a cath flush Follow up as scheduled.   Thank you for choosing [place] [facility] to provide your oncology and hematology care.  To afford each patient quality time with our provider, please arrive at least 15 minutes before your scheduled appointment time.   If you have a lab appointment with the [facility] please come in thru the  Main Entrance and check in at the main information desk  You need to re-schedule your appointment should you arrive 10 or more minutes late.  We strive to give you quality time with our providers, and arriving late affects you and other patients whose appointments are after yours.  Also, if you no show three or more times for appointments you may be dismissed from the clinic at the providers discretion.     Again, thank you for choosing [facility].  Our hope is that these requests will decrease the amount of time that you wait before being seen by our physicians.       _____________________________________________________________  Should you have questions after your visit to [facility], please contact our office at [phone] between the hours of 8:30 a.m. and 4:30 p.m.  Voicemails left after 4:30 p.m. will not be returned until the following business day.  For prescription refill requests, have your pharmacy contact our office.       Resources For Cancer Patients and their Caregivers ? American Cancer Society: Can assist with transportation, wigs, general needs, runs Look Good Feel Better.        9700268291 ? Cancer Care: Provides financial assistance, online support groups, medication/co-pay assistance.  1-800-813-HOPE (979)840-3854) ? [facility] Assists [place] cancer patients and their families through emotional , educational and financial support.  772 404 0529 ? [place] DSS Where  to apply for food stamps, Medicaid and utility assistance. (236) 381-0311 ? RCATS: Transportation to medical appointments. (270)374-5686 ? Social Security Administration: May apply for disability if have a Stage IV cancer. (819)056-5925 740-873-6381 ? [employer], Disability and Transit Services: Assists with nutrition, care and transit needs. [facility] Support Programs:   > Cancer Support Group  2nd Tuesday of the month 1pm-2pm, Journey Room   > Creative Journey  3rd Tuesday of the month 1130am-1pm, Journey Room

## 2018-05-23 ENCOUNTER — Encounter (HOSPITAL_COMMUNITY): Payer: Self-pay

## 2018-05-23 ENCOUNTER — Inpatient Hospital Stay (HOSPITAL_COMMUNITY): Payer: Medicare Other | Attending: Internal Medicine

## 2018-05-23 ENCOUNTER — Other Ambulatory Visit: Payer: Self-pay

## 2018-05-23 VITALS — BP 119/88 | HR 76 | Temp 98.5°F | Resp 18 | Wt 138.3 lb

## 2018-05-23 DIAGNOSIS — Z87891 Personal history of nicotine dependence: Secondary | ICD-10-CM | POA: Insufficient documentation

## 2018-05-23 DIAGNOSIS — C8338 Diffuse large B-cell lymphoma, lymph nodes of multiple sites: Secondary | ICD-10-CM | POA: Diagnosis not present

## 2018-05-23 DIAGNOSIS — Z79899 Other long term (current) drug therapy: Secondary | ICD-10-CM | POA: Diagnosis not present

## 2018-05-23 LAB — CBC WITH DIFFERENTIAL/PLATELET
Basophils Absolute: 0 10*3/uL (ref 0.0–0.1)
Basophils Relative: 1 %
EOS ABS: 0.2 10*3/uL (ref 0.0–0.7)
EOS PCT: 4 %
HEMATOCRIT: 37.3 % (ref 36.0–46.0)
HEMOGLOBIN: 12.7 g/dL (ref 12.0–15.0)
LYMPHS ABS: 0.7 10*3/uL (ref 0.7–4.0)
LYMPHS PCT: 17 %
MCH: 31.8 pg (ref 26.0–34.0)
MCHC: 34 g/dL (ref 30.0–36.0)
MCV: 93.3 fL (ref 78.0–100.0)
MONO ABS: 0.5 10*3/uL (ref 0.1–1.0)
MONOS PCT: 13 %
Neutro Abs: 2.7 10*3/uL (ref 1.7–7.7)
Neutrophils Relative %: 65 %
Platelets: 183 10*3/uL (ref 150–400)
RBC: 4 MIL/uL (ref 3.87–5.11)
RDW: 12.8 % (ref 11.5–15.5)
WBC: 4.1 10*3/uL (ref 4.0–10.5)

## 2018-05-23 LAB — COMPREHENSIVE METABOLIC PANEL
ALBUMIN: 4.5 g/dL (ref 3.5–5.0)
ALT: 20 U/L (ref 0–44)
AST: 24 U/L (ref 15–41)
Alkaline Phosphatase: 49 U/L (ref 38–126)
Anion gap: 11 (ref 5–15)
BUN: 20 mg/dL (ref 8–23)
CHLORIDE: 100 mmol/L (ref 98–111)
CO2: 27 mmol/L (ref 22–32)
CREATININE: 0.61 mg/dL (ref 0.44–1.00)
Calcium: 9.3 mg/dL (ref 8.9–10.3)
GFR calc Af Amer: >60 mL/min (ref >60)
GLUCOSE: 72 mg/dL (ref 70–99)
POTASSIUM: 4 mmol/L (ref 3.5–5.1)
Sodium: 138 mmol/L (ref 135–145)
Total Bilirubin: 1.1 mg/dL (ref 0.3–1.2)
Total Protein: 7.6 g/dL (ref 6.5–8.1)

## 2018-05-23 MED ORDER — HEPARIN SOD (PORK) LOCK FLUSH 100 UNIT/ML IV SOLN
500.0000 [IU] | Freq: Once | INTRAVENOUS | Status: AC
  Administered 2018-05-23: 500 [IU] via INTRAVENOUS

## 2018-05-23 MED ORDER — SODIUM CHLORIDE 0.9% FLUSH
10.0000 mL | INTRAVENOUS | Status: DC | PRN
  Administered 2018-05-23: 10 mL via INTRAVENOUS
  Filled 2018-05-23: qty 10

## 2018-05-23 NOTE — Progress Notes (Signed)
Monica Gray presented for Portacath access and flush.  Portacath located left chest wall accessed with  H 20 needle.  Good blood return present. Portacath flushed with 75ml NS and 500U/68ml Heparin and needle removed intact.  Procedure tolerated well and without incident.    Patient discharged ambulatory from clinic and in stable condition.  Follow up as scheduled.

## 2018-05-26 ENCOUNTER — Ambulatory Visit (HOSPITAL_COMMUNITY)
Admission: RE | Admit: 2018-05-26 | Discharge: 2018-05-26 | Disposition: A | Discharge status: Home / Self Care | Payer: Medicare Other | Source: Ambulatory Visit | Attending: Internal Medicine | Admitting: Internal Medicine

## 2018-05-26 DIAGNOSIS — C8330 Diffuse large B-cell lymphoma, unspecified site: Secondary | ICD-10-CM | POA: Diagnosis not present

## 2018-05-26 DIAGNOSIS — C8338 Diffuse large B-cell lymphoma, lymph nodes of multiple sites: Secondary | ICD-10-CM | POA: Diagnosis not present

## 2018-05-26 DIAGNOSIS — I2510 Atherosclerotic heart disease of native coronary artery without angina pectoris: Secondary | ICD-10-CM | POA: Insufficient documentation

## 2018-05-26 DIAGNOSIS — C8337 Diffuse large B-cell lymphoma, spleen: Secondary | ICD-10-CM | POA: Diagnosis not present

## 2018-05-26 DIAGNOSIS — I700 Atherosclerosis of aorta: Secondary | ICD-10-CM | POA: Diagnosis not present

## 2018-05-26 MED ORDER — IOPAMIDOL (ISOVUE-300) INJECTION 61%
120.0000 mL | Freq: Once | INTRAVENOUS | Status: AC | PRN
  Administered 2018-05-26: 120 mL via INTRAVENOUS

## 2018-05-28 ENCOUNTER — Other Ambulatory Visit: Payer: Self-pay

## 2018-05-28 ENCOUNTER — Inpatient Hospital Stay (HOSPITAL_BASED_OUTPATIENT_CLINIC_OR_DEPARTMENT_OTHER): Payer: Medicare Other | Admitting: Hematology

## 2018-05-28 ENCOUNTER — Encounter (HOSPITAL_COMMUNITY): Payer: Self-pay | Admitting: Hematology

## 2018-05-28 VITALS — BP 124/64 | HR 81 | Temp 98.8°F | Resp 16 | Wt 140.0 lb

## 2018-05-28 DIAGNOSIS — Z79899 Other long term (current) drug therapy: Secondary | ICD-10-CM | POA: Diagnosis not present

## 2018-05-28 DIAGNOSIS — Z87891 Personal history of nicotine dependence: Secondary | ICD-10-CM

## 2018-05-28 DIAGNOSIS — C8338 Diffuse large B-cell lymphoma, lymph nodes of multiple sites: Secondary | ICD-10-CM | POA: Diagnosis not present

## 2018-05-28 NOTE — Progress Notes (Signed)
[place], [place] [address]   CLINIC:  Medical Oncology/Hematology  PCP:  [name], MD [place] [address] [phone]   REASON FOR VISIT:  Follow-up for stage IV diffuse large B-cell lymphoma    CURRENT THERAPY: Observation   BRIEF ONCOLOGIC HISTORY:    Diffuse large B cell lymphoma ([place])   09/25/2016 Imaging    CT neck- 1. Large right level 5A a nodal mass measuring up to 2.9 cm. This is suggestive of metastatic disease from an unknown primary. Histologic sampling is recommended. 2. Pleural masses of the medial posterior right chest and anterior lateral left chest. The larger mass, adjacent to the right aspect of the T3 and T4 vertebral bodies, extends into the T2-T3 and T3-T4 neural foramina without definite extension into the spinal canal. These masses are also favored to be metastases. A primary pleural malignancy such as mesothelioma is also a consideration.      10/03/2016 Procedure    Needle core biopsy of right cervical lymph node by IR.      10/04/2016 Pathology Results    Interpretation Tissue-Flow Cytometry - INSUFFICIENT CELLS FOR ANALYSIS.      10/08/2016 Pathology Results    Diagnosis Lymph node, needle/core biopsy, right cervical - ATYPICAL LYMPHOID PROLIFERATION, SUSPICIOUS FOR NON-HODGKIN B-CELL LYMPHOMA.      10/31/2016 Procedure    Excisional lymph node biopsy by Dr. Dalbert Batman      11/02/2016 Pathology Results    Interpretation Tissue-Flow Cytometry - B-CELL POPULATION WITH KAPPA LIGHT CHAIN EXCESS.      11/02/2016 Pathology Results    Lymph node, biopsy, Deep Cervical - FOLLICULAR AND DIFFUSE LARGE B-CELL LYMPHOMA.      11/09/2016 PET scan    1. Active lymphoma within the neck, chest, abdomen, and pelvis, as detailed above. 2. Medial right breast hypermetabolic nodule is suspicious for a synchronous breast primary. Differential considerations include breast lymphoma or a  hypermetabolic benign lesion. Consider diagnostic right-sided mammogram and ultrasound. If the patient is diagnosed with right breast cancer, hypermetabolic small right axillary node would be indeterminate for lymphoma versus metastasis. 3. Right larger than left pleural effusions. 4.  Aortic atherosclerosis. 5. Hypermetabolic liver lesion is favored to represent lymphoma.      11/09/2016 Imaging    CT CAP- 1. Extensive adenopathy in the chest, abdomen and pelvis, bilateral pleural nodularity/masses, omental nodularity and renal lesions, most consistent with lymphoma. 2. Bilateral pleural effusions, large on the right and moderate on the left. 3. Lucent lesion in the L1 vertebral body, indeterminate but well-circumscribed and possibly benign. Attention on followup exams is warranted. 4. Aortic atherosclerosis (ICD10-170.0). Coronary artery calcification. 5. Right adrenal adenoma      11/14/2016 Procedure    Bone marrow aspiration and biopsy by IR      11/16/2016 Pathology Results    Bone Marrow, Aspirate,Biopsy, and Clot - SLIGHTLY HYPERCELLULAR BONE MARROW FOR AGE WITH TRILINEAGE HEMATOPOIESIS. - SEVERAL LYMPHOID AGGREGATES PRESENT. - SEE COMMENT PERIPHERAL BLOOD: - NO SIGNIFICANT MORPHOLOGIC ABNORMALITIES. Comment: Lower grade lymphoproliferative process cannot be entirely excluded especially given the paratrabecular location of some of the lymphoid aggregates, the overall findings are very limited and not considered disgnostic. There is no marrow involvement by large cell lymphoma.      11/16/2016 Procedure    BREAST BIOPSY      11/16/2016 Pathology Results    Breast, right, needle core biopsy, 2:30 o'clock, 3 cm from nipple - NON HODGKIN'S B CELL  LYMPHOMA. - SEE ONCOLOGY TABLE. 2. Lymph node, needle/core biopsy, right axilla - NON HODGKIN'S B CELL LYMPHOMA. - SEE ONCOLOGY TABLE. Microscopic Comment 1. LYMPHOMA      11/20/2016 Echocardiogram    Left ventricle:  The cavity size was normal. Wall thickness was normal. Systolic function was normal. The estimated ejection fraction was in the range of 60% to 65%. Doppler parameters are consistent with abnormal left ventricular relaxation (grade 1 diastolic dysfunction). Doppler parameters are consistent with high ventricular filling pressure.      11/20/2016 Procedure    Successful ultrasound guided right thoracentesis yielding 800 mL of pleural fluid.      11/20/2016 Pathology Results    PLEURAL FLUID, RIGHT - REACTIVE MESOTHELIAL CELLS AND SMALL LYMPHOID CELLS PRESENT. - SEE COMMENT. [name] MD Pathologist, Electronic Signature (Case signed 11/22/2016)      11/29/2016 - 03/14/2017 Chemotherapy    The patient had DOXOrubicin (ADRIAMYCIN) chemo injection 78 mg, 50 mg/m2 = 78 mg, Intravenous,  Once, 6 of 6 cycles  palonosetron (ALOXI) injection 0.25 mg, 0.25 mg, Intravenous,  Once, 6 of 6 cycles  pegfilgrastim (NEULASTA) injection 6 mg, 6 mg, Subcutaneous,  Once, 1 of 1 cycle  pegfilgrastim (NEULASTA ONPRO KIT) injection 6 mg, 6 mg, Subcutaneous, Once, 5 of 5 cycles  vinCRIStine (ONCOVIN) 2 mg in sodium chloride 0.9 % 50 mL chemo infusion, 2 mg, Intravenous,  Once, 6 of 6 cycles  riTUXimab (RITUXAN) 600 mg in sodium chloride 0.9 % 250 mL (1.9355 mg/mL) chemo infusion, 375 mg/m2 = 600 mg, Intravenous,  Once, 6 of 6 cycles  cyclophosphamide (CYTOXAN) 1,180 mg in sodium chloride 0.9 % 250 mL chemo infusion, 750 mg/m2 = 1,180 mg, Intravenous,  Once, 6 of 6 cycles  for chemotherapy treatment.        01/28/2017 PET scan    1. Marked improvement with essential resolution of the hypermetabolic activity in the adenopathy of the neck, chest, abdomen, and pelvis, and marked reduced size of associated lymph nodes. A right inguinal lymph node is currently [place] 2. Most of the prior lymph nodes are De[place] 1. 2. The left posterior nasopharyngeal activity has also resolved. 3.  Moderately hypermetabolic small right thyroid nodule with maximum SUV 11.9. A significant minority of such hypermetabolic thyroid nodules can be malignant, and thyroid ultrasound should be considered for further characterization. 4. Other imaging findings of potential clinical significance: Trace residual right pleural effusion. Old granulomatous disease. Aortic arch and abdominal aortoiliac atherosclerotic vascular disease.      04/03/2017 - 04/24/2017 Chemotherapy    IT MTX beginning on 04/03/2017 every 21 days x 6 cycles. Patient completed 2 intrathecal treatments but then opted to discontinue the rest of it. She stated she kept falling after both treatments.        04/04/2017 Pathology Results    Diagnosis CEREBROSPINAL FLUID(SPECIMEN 1 OF 1 COLLECTED 04/03/17): FEW MONONUCLEAR CELLS PRESENT.      04/25/2017 Pathology Results    CEREBROSPINAL FLUID(SPECIMEN 1 OF 1 COLLECTED 04/24/17): NO MALIGNANT CELLS IDENTIFIED.      04/30/2017 PET scan    1. No metabolically active lymphoma. Mildly enlarged non hypermetabolic right external iliac lymph node is decreased in size, compatible with treated lymphoma. 2. Persistent diffuse thyroid hypermetabolism with heterogeneous attenuation on the CT images without discrete thyroid nodules, more suggestive of nonspecific diffuse thyroiditis. Recommend correlation with serum thyroid function tests. Consider thyroid ultrasound correlation. 3. Additional findings include aortic atherosclerosis, 1 vessel coronary atherosclerosis, mild emphysema and stable right adrenal  adenoma.      05/02/2017 Remission    PET demonstrates NED.      05/08/2017 Imaging    US thyroid- 9 mm right midpole solid isoechoic TR 3 nodule correlates with the hypermetabolic nodule by recent PET-CT. Although the nodule is stable compared to 09/24/2016 and does not meet TI RADS criteria for biopsy, because of the hypermetabolism on PET imaging, biopsy of the nodule would be  recommended to exclude malignancy.      05/16/2017 Procedure    Tyroid nodule biopsy      05/17/2017 Pathology Results    THYROID, FINE NEEDLE ASPIRATION, RIGHT (SPECIMEN 1 OF 1 COLLECTED 05/16/17): CONSISTENT WITH BENIGN FOLLICULAR NODULE (BETHESDA CATEGORY II).        CANCER STAGING: Cancer Staging Diffuse large B cell lymphoma ([place]) Staging form: Hodgkin and Non-Hodgkin Lymphoma, AJCC 7th Edition - Clinical stage from 12/20/2016: Stage IV (E - Extranodal, B - Symptoms) - Signed by Baird Cancer, PA-C on 12/20/2016    INTERVAL HISTORY:  Ms. Cockrell 67 y.o. female returns for routine follow-up of diffuse large B-cell lymphoma. Patient is here today with her husband. Patient has been doing well. No complaints. Patient has numbness in her toes and feet but it is stable and was present before chemo. Denies any fevers or recent infections. Denies any nausea, vomiting, or diarrhea. Patient has started her depression medication Lexapro back and is working well for her. It has increased her appetite and her weigh is increasing. Patient was asking for a referral to the surgeon Dr. Fanny Skates for her port removal. Patient has great energy at 100%.      REVIEW OF SYSTEMS:  Review of Systems  Constitutional: Negative.   HENT:  Negative.   Eyes: Negative.   Respiratory: Negative.   Cardiovascular: Negative.   Gastrointestinal: Negative.   Endocrine: Negative.   Genitourinary: Negative.    Musculoskeletal: Negative.   Skin: Negative.   Neurological: Positive for numbness (toes and feet).  Hematological: Negative.   Psychiatric/Behavioral: Negative.      PAST MEDICAL/SURGICAL HISTORY:  Past Medical History:  Diagnosis Date  . Allergic to cats   . Anxiety   . Arthritis    hands  . Diffuse large B cell lymphoma ([facility]) 10/31/2016  . Dyspnea    with exertion - intermittent, per pt.; no O2  . History of asthma    as a child  . History of hepatitis C    states was cured in  2016 after [facility]  . Immature cataract   . Sensitive skin   . Wears dentures    upper; lower denture attaches to 4 dental implants, per pt.   Past Surgical History:  Procedure Laterality Date  . BONE MARROW ASPIRATION Right 11/14/2016   iliac  . BREAST BIOPSY Right   . CHEST TUBE INSERTION     1987, 1988  . LAPAROSCOPIC UNILATERAL SALPINGO OOPHERECTOMY     ectopic pregnancy  . LYMPH NODE BIOPSY Right 10/31/2016   Procedure: EXCISION DEEP RIGHT CERVICAL LYMPH NODES;  Surgeon: Fanny Skates, MD;  Location: [facility];  Service: General;  Laterality: Right;  . PORTACATH PLACEMENT Left 11/21/2016   Procedure: INSERTION PORT-A-CATH WITH [place];  Surgeon: Fanny Skates, MD;  Location: [facility];  Service: General;  Laterality: Left;     SOCIAL HISTORY:  Social History   Socioeconomic History  . Marital status: Married    Spouse name: Not on file  . Number of children: Not on file  .  Years of education: Not on file  . Highest education level: Not on file  Occupational History  . Not on file  Social Needs  . Financial resource strain: Not on file  . Food insecurity:    Worry: Not on file    Inability: Not on file  . Transportation needs:    Medical: Not on file    Non-medical: Not on file  Tobacco Use  . Smoking status: Former Smoker    Last attempt to quit: 1984    Years since quitting: 35.5  . Smokeless tobacco: Never Used  Substance and Sexual Activity  . Alcohol use: No  . Drug use: No  . Sexual activity: Not on file  Lifestyle  . Physical activity:    Days per week: Not on file    Minutes per session: Not on file  . Stress: Not on file  Relationships  . Social connections:    Talks on phone: Not on file    Gets together: Not on file    Attends religious service: Not on file    Active member of club or organization: Not on file    Attends meetings of clubs or organizations: Not on file    Relationship status: Not on file  . Intimate partner violence:     Fear of current or ex partner: Not on file    Emotionally abused: Not on file    Physically abused: Not on file    Forced sexual activity: Not on file  Other Topics Concern  . Not on file  Social History Narrative  . Not on file    FAMILY HISTORY:  Family History  Problem Relation Age of Onset  . Leukemia Mother   . Emphysema Father     CURRENT MEDICATIONS:  Outpatient Encounter Medications as of 05/28/2018  Medication Sig  . Capsicum, Cayenne, (CAYENNE PEPPER PO) Take by mouth daily.  Marland Kitchen escitalopram (LEXAPRO) 20 MG tablet 1 TABLET BY MOUTH DAILY  . Homeopathic Products (SIMILASAN DRY EYE RELIEF OP) Apply 1 drop to eye daily.  . magnesium gluconate (MAGONATE) 500 MG tablet Take 500 mg by mouth every evening.   . meclizine (ANTIVERT) 25 MG tablet Take 2 tablets (50 mg total) by mouth 2 (two) times daily as needed for dizziness.  . mineral oil liquid Take 15 mLs by mouth daily as needed for moderate constipation.  . Multiple Vitamins-Minerals (MULTIVITAMIN WITH MINERALS) tablet Take 1 tablet by mouth daily.  . vitamin C (ASCORBIC ACID) 500 MG tablet Take 500 mg by mouth daily.  . ondansetron (ZOFRAN) 8 MG tablet Take 1 tablet (8 mg total) by mouth every 8 (eight) hours as needed for refractory nausea / vomiting. (Patient not taking: Reported on 05/28/2018)  . [DISCONTINUED] ALPRAZolam (XANAX) 0.25 MG tablet Take 2 tablets as needed three times a day (Patient not taking: Reported on 12/05/2017)  . [DISCONTINUED] baclofen (LIORESAL) 10 MG tablet Take 1 tablet by mouth twice daily as needed for hiccups. (Patient not taking: Reported on 12/05/2017)  . [DISCONTINUED] Diphenhyd-Hydrocort-Nystatin (FIRST-DUKES MOUTHWASH) SUSP Diphenhydramine 12.5 mg/5 mL 1 part, Viscous Lidicaine 2% 1 part, Maalox 1 part, Swish and swallow 5 mL no more than every 4 hours (Patient not taking: Reported on 12/05/2017)  . [DISCONTINUED] ibuprofen (ADVIL,MOTRIN) 800 MG tablet Take 800 mg by mouth every 8 (eight)  hours as needed for pain.  . [DISCONTINUED] Incontinent Wash (BALNEOL) LOTN Apply externally as directed. (Patient not taking: Reported on 12/05/2017)  . [DISCONTINUED] lidocaine-prilocaine (EMLA) cream  Apply to affected area once (Patient not taking: Reported on 12/05/2017)  . [DISCONTINUED] oxyCODONE (OXY IR/ROXICODONE) 5 MG immediate release tablet Take 1 tablet (5 mg total) by mouth every 4 (four) hours as needed for severe pain.  . [DISCONTINUED] polyethylene glycol powder (MIRALAX) powder Take 1 capful daily.  . [DISCONTINUED] Probiotic CAPS Take 1 capsule by mouth every evening.  . [DISCONTINUED] prochlorperazine (COMPAZINE) 10 MG tablet Take 1 tablet (10 mg total) by mouth every 6 (six) hours as needed (Nausea or vomiting). (Patient not taking: Reported on 12/05/2017)  . [DISCONTINUED] senna (SENOKOT) 8.6 MG TABS tablet Take 2 tablets by mouth daily.  . [DISCONTINUED] traMADol (ULTRAM) 50 MG tablet Take 1 tablet (50 mg total) by mouth 2 (two) times daily as needed.   No facility-administered encounter medications on file as of 05/28/2018.     ALLERGIES:  Allergies  Allergen Reactions  . Acetaminophen Other (See Comments)    GI upset  . Codeine Other (See Comments)    GI UPSET  . Dye Fdc Red [Red Dye] Other (See Comments)    HEADACHES  . Shellfish Allergy Hives and Swelling    SWELLING OF THROAT  . Sulfa Antibiotics Other (See Comments)    GI UPSET  . Zanaflex [Tizanidine Hcl] Other (See Comments)    GI UPSET   . Adhesive [Tape] Other (See Comments)    SKIN IRRITATION     PHYSICAL EXAM:  ECOG Performance status: 1  Vitals:   05/28/18 1402  BP: 124/64  Pulse: 81  Resp: 16  Temp: 98.8 F (37.1 C)  SpO2: 99%   Filed Weights   05/28/18 1402  Weight: 140 lb (63.5 kg)    Physical Exam HEENT: Oropharynx has no thrush or other lesions. Chest: Bilateral clear to auscultation. Lymphadenopathy: No palpable neck, supraclavicular, axillary or inguinal adenopathy. Abdomen:  No palpable hepatosplenomegaly or other masses. Extremity's: No edema or cyanosis.  LABORATORY DATA:  I have reviewed the labs as listed.  CBC    Component Value Date/Time   WBC 4.1 05/23/2018 1036   RBC 4.00 05/23/2018 1036   HGB 12.7 05/23/2018 1036   HCT 37.3 05/23/2018 1036   PLT 183 05/23/2018 1036   MCV 93.3 05/23/2018 1036   MCH 31.8 05/23/2018 1036   MCHC 34.0 05/23/2018 1036   RDW 12.8 05/23/2018 1036   LYMPHSABS 0.7 05/23/2018 1036   MONOABS 0.5 05/23/2018 1036   EOSABS 0.2 05/23/2018 1036   BASOSABS 0.0 05/23/2018 1036   CMP Latest Ref Rng & Units 05/23/2018 11/04/2017 09/12/2017  Glucose 70 - 99 mg/dL 72 121(H) 94  BUN 8 - 23 mg/dL '20 19 18  '$ Creatinine 0.44 - 1.00 mg/dL 0.61 0.59 0.48  Sodium 135 - 145 mmol/L 138 137 137  Potassium 3.5 - 5.1 mmol/L 4.0 4.4 4.1  Chloride 98 - 111 mmol/L 100 102 102  CO2 22 - 32 mmol/L '27 28 25  '$ Calcium 8.9 - 10.3 mg/dL 9.3 9.8 9.4  Total Protein 6.5 - 8.1 g/dL 7.6 7.8 7.5  Total Bilirubin 0.3 - 1.2 mg/dL 1.1 0.9 0.8  Alkaline Phos 38 - 126 U/L 49 57 63  AST 15 - 41 U/L '24 27 22  '$ ALT 0 - 44 U/L '20 20 16       '$ DIAGNOSTIC IMAGING:  I have independently reviewed the results of the CT scan of the chest, abdomen and pelvis dated 06/05/2018 which showed stable exam.  Mammogram of breasts in February was also negative.  ASSESSMENT & PLAN:   Diffuse large B cell lymphoma ([facility]) 1.  Stage IV diffuse large B-cell lymphoma: -Arising from a follicular lymphoma, PET/CT suspicious for hypermetabolic liver lesion (not biopsy proven) and left nasopharyngeal hypermetabolic some, breast lesion which was biopsy-proven DLBCL, bone marrow aspiration and biopsy negative -Status post R-CHOP x6 from 11/29/2016 through 03/14/2017, status post IT MTX for 2 doses in May and June 2018, could not tolerate it -PET scan on 04/29/2017 with complete CR -Today's physical examination did not reveal any lymphadenopathy or hepatosplenomegaly.  Denies any B  symptoms. -CT scan of the chest, abdomen and pelvis dated 05/26/2018 shows stable exam.  Mammogram on 12/27/2017 was negative. -She will come back in 6 months for follow-up with repeat blood work and CT scan.  She wants to have her Port-A-Cath removed.  She was instructed to call Dr. Darrel Hoover office.      Orders placed this encounter:  Orders Placed This Encounter  Procedures  . Protein electrophoresis, serum  . Lactate dehydrogenase, isoenzymes  . Immunofixation electrophoresis  . Kappa/lambda light chains  . CBC with Differential/Platelet  . Comprehensive metabolic panel      Derek Jack, MD [place] [phone]

## 2018-05-28 NOTE — Assessment & Plan Note (Addendum)
1.  Stage IV diffuse large B-cell lymphoma: -Arising from a follicular lymphoma, PET/CT suspicious for hypermetabolic liver lesion (not biopsy proven) and left nasopharyngeal hypermetabolic some, breast lesion which was biopsy-proven DLBCL, bone marrow aspiration and biopsy negative -Status post R-CHOP x6 from 11/29/2016 through 03/14/2017, status post IT MTX for 2 doses in May and June 2018, could not tolerate it -PET scan on 04/29/2017 with complete CR -Today's physical examination did not reveal any lymphadenopathy or hepatosplenomegaly.  Denies any B symptoms. -CT scan of the chest, abdomen and pelvis dated 05/26/2018 shows stable exam.  Mammogram on 12/27/2017 was negative. -She will come back in 6 months for follow-up with repeat blood work and CT scan.  She wants to have her Port-A-Cath removed.  She was instructed to call Dr. Darrel Hoover office.

## 2018-06-04 ENCOUNTER — Ambulatory Visit (HOSPITAL_COMMUNITY): Payer: Medicare Other

## 2018-07-07 DIAGNOSIS — Z452 Encounter for adjustment and management of vascular access device: Secondary | ICD-10-CM | POA: Diagnosis not present

## 2018-07-07 DIAGNOSIS — Z8579 Personal history of other malignant neoplasms of lymphoid, hematopoietic and related tissues: Secondary | ICD-10-CM | POA: Diagnosis not present

## 2018-09-17 DIAGNOSIS — Z23 Encounter for immunization: Secondary | ICD-10-CM | POA: Diagnosis not present

## 2018-11-03 ENCOUNTER — Encounter (HOSPITAL_COMMUNITY): Payer: Self-pay | Admitting: *Deleted

## 2018-11-03 NOTE — Progress Notes (Signed)
Patient called clinic stating that she wants to be weaned off her Lexapro. She is wanting to start taking herbal supplements. I left a voicemail and instructed to not stop taking it abruptly. She is to take 1/2 tablet daily for one month then she can stop it all together. She was instructed to call the cancer center if she did not understand the instructions.

## 2018-11-28 ENCOUNTER — Inpatient Hospital Stay (HOSPITAL_COMMUNITY): Payer: Medicare Other | Attending: Hematology

## 2018-11-28 DIAGNOSIS — C8338 Diffuse large B-cell lymphoma, lymph nodes of multiple sites: Secondary | ICD-10-CM | POA: Diagnosis not present

## 2018-11-28 DIAGNOSIS — Z79899 Other long term (current) drug therapy: Secondary | ICD-10-CM | POA: Diagnosis not present

## 2018-11-28 DIAGNOSIS — Z9221 Personal history of antineoplastic chemotherapy: Secondary | ICD-10-CM | POA: Diagnosis not present

## 2018-11-28 DIAGNOSIS — Z87891 Personal history of nicotine dependence: Secondary | ICD-10-CM | POA: Diagnosis not present

## 2018-11-28 LAB — CBC WITH DIFFERENTIAL/PLATELET
ABS IMMATURE GRANULOCYTES: 0.01 10*3/uL (ref 0.00–0.07)
BASOS ABS: 0 10*3/uL (ref 0.0–0.1)
Basophils Relative: 1 %
EOS PCT: 4 %
Eosinophils Absolute: 0.2 10*3/uL (ref 0.0–0.5)
HEMATOCRIT: 38.8 % (ref 36.0–46.0)
HEMOGLOBIN: 12.9 g/dL (ref 12.0–15.0)
Immature Granulocytes: 0 %
LYMPHS ABS: 1.1 10*3/uL (ref 0.7–4.0)
Lymphocytes Relative: 23 %
MCH: 31.5 pg (ref 26.0–34.0)
MCHC: 33.2 g/dL (ref 30.0–36.0)
MCV: 94.6 fL (ref 80.0–100.0)
Monocytes Absolute: 0.5 10*3/uL (ref 0.1–1.0)
Monocytes Relative: 11 %
NEUTROS ABS: 2.9 10*3/uL (ref 1.7–7.7)
NRBC: 0 % (ref 0.0–0.2)
Neutrophils Relative %: 61 %
PLATELETS: 205 10*3/uL (ref 150–400)
RBC: 4.1 MIL/uL (ref 3.87–5.11)
RDW: 12.4 % (ref 11.5–15.5)
WBC: 4.7 10*3/uL (ref 4.0–10.5)

## 2018-11-28 LAB — COMPREHENSIVE METABOLIC PANEL
ALT: 17 U/L (ref 0–44)
AST: 22 U/L (ref 15–41)
Albumin: 4.6 g/dL (ref 3.5–5.0)
Alkaline Phosphatase: 53 U/L (ref 38–126)
Anion gap: 7 (ref 5–15)
BUN: 17 mg/dL (ref 8–23)
CO2: 29 mmol/L (ref 22–32)
Calcium: 9.6 mg/dL (ref 8.9–10.3)
Chloride: 99 mmol/L (ref 98–111)
Creatinine, Ser: 0.59 mg/dL (ref 0.44–1.00)
Glucose, Bld: 101 mg/dL — ABNORMAL HIGH (ref 70–99)
POTASSIUM: 4.5 mmol/L (ref 3.5–5.1)
SODIUM: 135 mmol/L (ref 135–145)
Total Bilirubin: 0.6 mg/dL (ref 0.3–1.2)
Total Protein: 7.7 g/dL (ref 6.5–8.1)

## 2018-12-01 LAB — KAPPA/LAMBDA LIGHT CHAINS
KAPPA, LAMDA LIGHT CHAIN RATIO: 1.86 — AB (ref 0.26–1.65)
Kappa free light chain: 14.5 mg/L (ref 3.3–19.4)
Lambda free light chains: 7.8 mg/L (ref 5.7–26.3)

## 2018-12-01 LAB — IMMUNOFIXATION ELECTROPHORESIS
IGA: 104 mg/dL (ref 87–352)
IGG (IMMUNOGLOBIN G), SERUM: 1253 mg/dL (ref 700–1600)
IGM (IMMUNOGLOBULIN M), SRM: 36 mg/dL (ref 26–217)
TOTAL PROTEIN ELP: 7.5 g/dL (ref 6.0–8.5)

## 2018-12-01 LAB — PROTEIN ELECTROPHORESIS, SERUM
A/G RATIO SPE: 1.3 (ref 0.7–1.7)
ALBUMIN ELP: 4.2 g/dL (ref 2.9–4.4)
Alpha-1-Globulin: 0.2 g/dL (ref 0.0–0.4)
Alpha-2-Globulin: 0.8 g/dL (ref 0.4–1.0)
Beta Globulin: 1 g/dL (ref 0.7–1.3)
GAMMA GLOBULIN: 1.1 g/dL (ref 0.4–1.8)
Globulin, Total: 3.2 g/dL (ref 2.2–3.9)
TOTAL PROTEIN ELP: 7.4 g/dL (ref 6.0–8.5)

## 2018-12-03 LAB — LACTATE DEHYDROGENASE, ISOENZYMES
LDH 1: 24 % (ref 17–32)
LDH 2: 33 % (ref 25–40)
LDH 3: 23 % (ref 17–27)
LDH 4: 12 % (ref 5–13)
LDH 5: 8 % (ref 4–20)
LDH Isoenzymes, Total: 172 IU/L (ref 119–226)

## 2018-12-05 ENCOUNTER — Other Ambulatory Visit: Payer: Self-pay

## 2018-12-05 ENCOUNTER — Encounter (HOSPITAL_COMMUNITY): Payer: Self-pay | Admitting: Hematology

## 2018-12-05 ENCOUNTER — Inpatient Hospital Stay (HOSPITAL_BASED_OUTPATIENT_CLINIC_OR_DEPARTMENT_OTHER): Payer: Medicare Other | Admitting: Hematology

## 2018-12-05 VITALS — BP 149/89 | HR 76 | Temp 98.0°F | Resp 18 | Wt 149.0 lb

## 2018-12-05 DIAGNOSIS — Z9221 Personal history of antineoplastic chemotherapy: Secondary | ICD-10-CM

## 2018-12-05 DIAGNOSIS — Z79899 Other long term (current) drug therapy: Secondary | ICD-10-CM | POA: Diagnosis not present

## 2018-12-05 DIAGNOSIS — Z87891 Personal history of nicotine dependence: Secondary | ICD-10-CM | POA: Diagnosis not present

## 2018-12-05 DIAGNOSIS — C8338 Diffuse large B-cell lymphoma, lymph nodes of multiple sites: Secondary | ICD-10-CM | POA: Diagnosis not present

## 2018-12-05 NOTE — Assessment & Plan Note (Signed)
1.  Stage IV diffuse large B-cell lymphoma: -Arising from a follicular lymphoma, PET/CT suspicious for hypermetabolic liver lesion (not biopsy proven) and left nasopharyngeal hypermetabolic some, breast lesion which was biopsy-proven DLBCL, bone marrow aspiration and biopsy negative -Status post R-CHOP x6 from 11/29/2016 through 03/14/2017, status post IT MTX for 2 doses in May and June 2018, could not tolerate it -PET scan on 04/29/2017 with complete CR.  -Physical examination did not reveal any lymphadenopathy or palpable splenomegaly. -CT CAP on 05/26/2018 shows stable exam.  Mammogram on 12/28/2007 was negative. - We have reviewed her blood work today.  LDH was within normal limits. -She will come back in 6 months for follow-up with blood work and physical exam.  We will do CT scans if clinical condition dictates.

## 2018-12-05 NOTE — Progress Notes (Signed)
[place], [place] [address]   CLINIC:  Medical Oncology/Hematology  PCP:  [name], MD [place] [address] [phone]   REASON FOR VISIT: Follow-up for stage IV diffuse large B-cell lymphoma    CURRENT THERAPY: Observation   BRIEF ONCOLOGIC HISTORY:    Diffuse large B cell lymphoma ([facility])   09/25/2016 Imaging    CT neck- 1. Large right level 5A a nodal mass measuring up to 2.9 cm. This is suggestive of metastatic disease from an unknown primary. Histologic sampling is recommended. 2. Pleural masses of the medial posterior right chest and anterior lateral left chest. The larger mass, adjacent to the right aspect of the T3 and T4 vertebral bodies, extends into the T2-T3 and T3-T4 neural foramina without definite extension into the spinal canal. These masses are also favored to be metastases. A primary pleural malignancy such as mesothelioma is also a consideration.    10/03/2016 Procedure    Needle core biopsy of right cervical lymph node by IR.    10/04/2016 Pathology Results    Interpretation Tissue-Flow Cytometry - INSUFFICIENT CELLS FOR ANALYSIS.    10/08/2016 Pathology Results    Diagnosis Lymph node, needle/core biopsy, right cervical - ATYPICAL LYMPHOID PROLIFERATION, SUSPICIOUS FOR NON-HODGKIN B-CELL LYMPHOMA.    10/31/2016 Procedure    Excisional lymph node biopsy by Dr. Dalbert Batman    11/02/2016 Pathology Results    Interpretation Tissue-Flow Cytometry - B-CELL POPULATION WITH KAPPA LIGHT CHAIN EXCESS.    11/02/2016 Pathology Results    Lymph node, biopsy, Deep Cervical - FOLLICULAR AND DIFFUSE LARGE B-CELL LYMPHOMA.    11/09/2016 PET scan    1. Active lymphoma within the neck, chest, abdomen, and pelvis, as detailed above. 2. Medial right breast hypermetabolic nodule is suspicious for a synchronous breast primary. Differential considerations include breast lymphoma or a hypermetabolic benign  lesion. Consider diagnostic right-sided mammogram and ultrasound. If the patient is diagnosed with right breast cancer, hypermetabolic small right axillary node would be indeterminate for lymphoma versus metastasis. 3. Right larger than left pleural effusions. 4.  Aortic atherosclerosis. 5. Hypermetabolic liver lesion is favored to represent lymphoma.    11/09/2016 Imaging    CT CAP- 1. Extensive adenopathy in the chest, abdomen and pelvis, bilateral pleural nodularity/masses, omental nodularity and renal lesions, most consistent with lymphoma. 2. Bilateral pleural effusions, large on the right and moderate on the left. 3. Lucent lesion in the L1 vertebral body, indeterminate but well-circumscribed and possibly benign. Attention on followup exams is warranted. 4. Aortic atherosclerosis (ICD10-170.0). Coronary artery calcification. 5. Right adrenal adenoma    11/14/2016 Procedure    Bone marrow aspiration and biopsy by IR    11/16/2016 Pathology Results    Bone Marrow, Aspirate,Biopsy, and Clot - SLIGHTLY HYPERCELLULAR BONE MARROW FOR AGE WITH TRILINEAGE HEMATOPOIESIS. - SEVERAL LYMPHOID AGGREGATES PRESENT. - SEE COMMENT PERIPHERAL BLOOD: - NO SIGNIFICANT MORPHOLOGIC ABNORMALITIES. Comment: Lower grade lymphoproliferative process cannot be entirely excluded especially given the paratrabecular location of some of the lymphoid aggregates, the overall findings are very limited and not considered disgnostic. There is no marrow involvement by large cell lymphoma.    11/16/2016 Procedure    BREAST BIOPSY    11/16/2016 Pathology Results    Breast, right, needle core biopsy, 2:30 o'clock, 3 cm from nipple - NON HODGKIN'S B CELL LYMPHOMA. - SEE ONCOLOGY TABLE. 2. Lymph node, needle/core biopsy, right axilla - NON HODGKIN'S B CELL LYMPHOMA. - SEE ONCOLOGY TABLE. Microscopic Comment 1.  LYMPHOMA    11/20/2016 Echocardiogram    Left ventricle: The cavity size was normal. Wall  thickness was normal. Systolic function was normal. The estimated ejection fraction was in the range of 60% to 65%. Doppler parameters are consistent with abnormal left ventricular relaxation (grade 1 diastolic dysfunction). Doppler parameters are consistent with high ventricular filling pressure.    11/20/2016 Procedure    Successful ultrasound guided right thoracentesis yielding 800 mL of pleural fluid.    11/20/2016 Pathology Results    PLEURAL FLUID, RIGHT - REACTIVE MESOTHELIAL CELLS AND SMALL LYMPHOID CELLS PRESENT. - SEE COMMENT. [name] MD Pathologist, Electronic Signature (Case signed 11/22/2016)    11/29/2016 - 03/14/2017 Chemotherapy    The patient had DOXOrubicin (ADRIAMYCIN) chemo injection 78 mg, 50 mg/m2 = 78 mg, Intravenous,  Once, 6 of 6 cycles  palonosetron (ALOXI) injection 0.25 mg, 0.25 mg, Intravenous,  Once, 6 of 6 cycles  pegfilgrastim (NEULASTA) injection 6 mg, 6 mg, Subcutaneous,  Once, 1 of 1 cycle  pegfilgrastim (NEULASTA ONPRO KIT) injection 6 mg, 6 mg, Subcutaneous, Once, 5 of 5 cycles  vinCRIStine (ONCOVIN) 2 mg in sodium chloride 0.9 % 50 mL chemo infusion, 2 mg, Intravenous,  Once, 6 of 6 cycles  riTUXimab (RITUXAN) 600 mg in sodium chloride 0.9 % 250 mL (1.9355 mg/mL) chemo infusion, 375 mg/m2 = 600 mg, Intravenous,  Once, 6 of 6 cycles  cyclophosphamide (CYTOXAN) 1,180 mg in sodium chloride 0.9 % 250 mL chemo infusion, 750 mg/m2 = 1,180 mg, Intravenous,  Once, 6 of 6 cycles  for chemotherapy treatment.      01/28/2017 PET scan    1. Marked improvement with essential resolution of the hypermetabolic activity in the adenopathy of the neck, chest, abdomen, and pelvis, and marked reduced size of associated lymph nodes. A right inguinal lymph node is currently Deauville 2. Most of the prior lymph nodes are Deauville 1. 2. The left posterior nasopharyngeal activity has also resolved. 3. Moderately hypermetabolic small right thyroid nodule  with maximum SUV 11.9. A significant minority of such hypermetabolic thyroid nodules can be malignant, and thyroid ultrasound should be considered for further characterization. 4. Other imaging findings of potential clinical significance: Trace residual right pleural effusion. Old granulomatous disease. Aortic arch and abdominal aortoiliac atherosclerotic vascular disease.    04/03/2017 - 04/24/2017 Chemotherapy    IT MTX beginning on 04/03/2017 every 21 days x 6 cycles. Patient completed 2 intrathecal treatments but then opted to discontinue the rest of it. She stated she kept falling after both treatments.      04/04/2017 Pathology Results    Diagnosis CEREBROSPINAL FLUID(SPECIMEN 1 OF 1 COLLECTED 04/03/17): FEW MONONUCLEAR CELLS PRESENT.    04/25/2017 Pathology Results    CEREBROSPINAL FLUID(SPECIMEN 1 OF 1 COLLECTED 04/24/17): NO MALIGNANT CELLS IDENTIFIED.    04/30/2017 PET scan    1. No metabolically active lymphoma. Mildly enlarged non hypermetabolic right external iliac lymph node is decreased in size, compatible with treated lymphoma. 2. Persistent diffuse thyroid hypermetabolism with heterogeneous attenuation on the CT images without discrete thyroid nodules, more suggestive of nonspecific diffuse thyroiditis. Recommend correlation with serum thyroid function tests. Consider thyroid ultrasound correlation. 3. Additional findings include aortic atherosclerosis, 1 vessel coronary atherosclerosis, mild emphysema and stable right adrenal adenoma.    05/02/2017 Remission    PET demonstrates NED.    05/08/2017 Imaging    US thyroid- 9 mm right midpole solid isoechoic TR 3 nodule correlates with the hypermetabolic nodule by recent PET-CT. Although the nodule is  stable compared to 09/24/2016 and does not meet TI RADS criteria for biopsy, because of the hypermetabolism on PET imaging, biopsy of the nodule would be recommended to exclude malignancy.    05/16/2017 Procedure    Tyroid  nodule biopsy    05/17/2017 Pathology Results    THYROID, FINE NEEDLE ASPIRATION, RIGHT (SPECIMEN 1 OF 1 COLLECTED 05/16/17): CONSISTENT WITH BENIGN FOLLICULAR NODULE (BETHESDA CATEGORY II).      CANCER STAGING: Cancer Staging Diffuse large B cell lymphoma ([facility]) Staging form: Hodgkin and Non-Hodgkin Lymphoma, AJCC 7th Edition - Clinical stage from 12/20/2016: Stage IV (E - Extranodal, B - Symptoms) - Signed by Baird Cancer, PA-C on 12/20/2016    INTERVAL HISTORY:  Ms. Provencal 68 y.o. female returns for routine follow-up for stage IV diffuse large B-cell lymphoma. She is here today and doing well she has no complaints besides her putting on a few extra pounds. Denies any nausea, vomiting, or diarrhea. Denies any new pains. Had not noticed any recent bleeding such as epistaxis, hematuria or hematochezia. Denies recent chest pain on exertion, shortness of breath on minimal exertion, pre-syncopal episodes, or palpitations. Denies any numbness or tingling in hands or feet. Denies any recent fevers, infections, or recent hospitalizations. She denies any lumps present at this time. Denies any night sweats, chills, or unexplained weight loss. Patient reports appetite at 100% and energy level at 100%.   REVIEW OF SYSTEMS:  Review of Systems  All other systems reviewed and are negative.    PAST MEDICAL/SURGICAL HISTORY:  Past Medical History:  Diagnosis Date  . Allergic to cats   . Anxiety   . Arthritis    hands  . Diffuse large B cell lymphoma ([facility]) 10/31/2016  . Dyspnea    with exertion - intermittent, per pt.; no O2  . History of asthma    as a child  . History of hepatitis C    states was cured in 2016 after [facility]  . Immature cataract   . Sensitive skin   . Wears dentures    upper; lower denture attaches to 4 dental implants, per pt.   Past Surgical History:  Procedure Laterality Date  . BONE MARROW ASPIRATION Right 11/14/2016   iliac  . BREAST BIOPSY Right   . CHEST TUBE  INSERTION     1987, 1988  . LAPAROSCOPIC UNILATERAL SALPINGO OOPHERECTOMY     ectopic pregnancy  . LYMPH NODE BIOPSY Right 10/31/2016   Procedure: EXCISION DEEP RIGHT CERVICAL LYMPH NODES;  Surgeon: Fanny Skates, MD;  Location: [facility];  Service: General;  Laterality: Right;  . PORTACATH PLACEMENT Left 11/21/2016   Procedure: INSERTION PORT-A-CATH WITH [place];  Surgeon: Fanny Skates, MD;  Location: [facility];  Service: General;  Laterality: Left;     SOCIAL HISTORY:  Social History   Socioeconomic History  . Marital status: Married    Spouse name: Not on file  . Number of children: Not on file  . Years of education: Not on file  . Highest education level: Not on file  Occupational History  . Not on file  Social Needs  . Financial resource strain: Not on file  . Food insecurity:    Worry: Not on file    Inability: Not on file  . Transportation needs:    Medical: Not on file    Non-medical: Not on file  Tobacco Use  . Smoking status: Former Smoker    Last attempt to quit: 1984    Years since  quitting: 36.0  . Smokeless tobacco: Never Used  Substance and Sexual Activity  . Alcohol use: No  . Drug use: No  . Sexual activity: Not on file  Lifestyle  . Physical activity:    Days per week: Not on file    Minutes per session: Not on file  . Stress: Not on file  Relationships  . Social connections:    Talks on phone: Not on file    Gets together: Not on file    Attends religious service: Not on file    Active member of club or organization: Not on file    Attends meetings of clubs or organizations: Not on file    Relationship status: Not on file  . Intimate partner violence:    Fear of current or ex partner: Not on file    Emotionally abused: Not on file    Physically abused: Not on file    Forced sexual activity: Not on file  Other Topics Concern  . Not on file  Social History Narrative  . Not on file    FAMILY HISTORY:  Family History  Problem  Relation Age of Onset  . Leukemia Mother   . Emphysema Father     CURRENT MEDICATIONS:  Outpatient Encounter Medications as of 12/05/2018  Medication Sig  . magnesium gluconate (MAGONATE) 500 MG tablet Take 500 mg by mouth every evening.   . methylcellulose oral powder Take by mouth daily.  . Multiple Vitamins-Minerals (MULTIVITAMIN WITH MINERALS) tablet Take 1 tablet by mouth daily.  Marland Kitchen UNABLE TO FIND Med Name: safron plus  . vitamin C (ASCORBIC ACID) 500 MG tablet Take 500 mg by mouth daily.  . [DISCONTINUED] Homeopathic Products (SIMILASAN DRY EYE RELIEF OP) Apply 1 drop to eye daily.  . [DISCONTINUED] Capsicum, Cayenne, (CAYENNE PEPPER PO) Take by mouth daily.  . [DISCONTINUED] escitalopram (LEXAPRO) 20 MG tablet 1 TABLET BY MOUTH DAILY (Patient not taking: Reported on 12/05/2018)  . [DISCONTINUED] meclizine (ANTIVERT) 25 MG tablet Take 2 tablets (50 mg total) by mouth 2 (two) times daily as needed for dizziness.  . [DISCONTINUED] mineral oil liquid Take 15 mLs by mouth daily as needed for moderate constipation.  . [DISCONTINUED] ondansetron (ZOFRAN) 8 MG tablet Take 1 tablet (8 mg total) by mouth every 8 (eight) hours as needed for refractory nausea / vomiting. (Patient not taking: Reported on 05/28/2018)   No facility-administered encounter medications on file as of 12/05/2018.     ALLERGIES:  Allergies  Allergen Reactions  . Acetaminophen Other (See Comments)    GI upset  . Codeine Other (See Comments)    GI UPSET  . Dye Fdc Red [Red Dye] Other (See Comments)    HEADACHES  . Shellfish Allergy Hives and Swelling    SWELLING OF THROAT  . Sulfa Antibiotics Other (See Comments)    GI UPSET  . Zanaflex [Tizanidine Hcl] Other (See Comments)    GI UPSET   . Adhesive [Tape] Other (See Comments)    SKIN IRRITATION     PHYSICAL EXAM:  ECOG Performance status: 1  Vitals:   12/05/18 1400  BP: (!) 149/89  Pulse: 76  Resp: 18  Temp: 98 F (36.7 C)  SpO2: 100%   Filed  Weights   12/05/18 1400  Weight: 149 lb (67.6 kg)    Physical Exam Constitutional:      Appearance: Normal appearance. She is normal weight.  Neck:     Musculoskeletal: Normal range of motion and neck supple.  Cardiovascular:     Rate and Rhythm: Normal rate and regular rhythm.     Heart sounds: Normal heart sounds.  Pulmonary:     Effort: Pulmonary effort is normal.     Breath sounds: Normal breath sounds.  Abdominal:     General: Abdomen is flat.     Palpations: Abdomen is soft.  Musculoskeletal: Normal range of motion.  Skin:    General: Skin is warm and dry.  Neurological:     Mental Status: She is alert and oriented to person, place, and time. Mental status is at baseline.  Psychiatric:        Mood and Affect: Mood normal.        Behavior: Behavior normal.        Thought Content: Thought content normal.        Judgment: Judgment normal.   No lymphadenopathy no palpable splenomegaly.   LABORATORY DATA:  I have reviewed the labs as listed.  CBC    Component Value Date/Time   WBC 4.7 11/28/2018 1203   RBC 4.10 11/28/2018 1203   HGB 12.9 11/28/2018 1203   HCT 38.8 11/28/2018 1203   PLT 205 11/28/2018 1203   MCV 94.6 11/28/2018 1203   MCH 31.5 11/28/2018 1203   MCHC 33.2 11/28/2018 1203   RDW 12.4 11/28/2018 1203   LYMPHSABS 1.1 11/28/2018 1203   MONOABS 0.5 11/28/2018 1203   EOSABS 0.2 11/28/2018 1203   BASOSABS 0.0 11/28/2018 1203   CMP Latest Ref Rng & Units 11/28/2018 05/23/2018 11/04/2017  Glucose 70 - 99 mg/dL 101(H) 72 121(H)  BUN 8 - 23 mg/dL '17 20 19  ' Creatinine 0.44 - 1.00 mg/dL 0.59 0.61 0.59  Sodium 135 - 145 mmol/L 135 138 137  Potassium 3.5 - 5.1 mmol/L 4.5 4.0 4.4  Chloride 98 - 111 mmol/L 99 100 102  CO2 22 - 32 mmol/L '29 27 28  ' Calcium 8.9 - 10.3 mg/dL 9.6 9.3 9.8  Total Protein 6.5 - 8.1 g/dL 7.7 7.6 7.8  Total Bilirubin 0.3 - 1.2 mg/dL 0.6 1.1 0.9  Alkaline Phos 38 - 126 U/L 53 49 57  AST 15 - 41 U/L '22 24 27  ' ALT 0 - 44 U/L '17 20 20        ' DIAGNOSTIC IMAGING:  I have independently reviewed the scans and discussed with the patient.   I have reviewed Francene Finders, NP's note and agree with the documentation.  I personally performed a face-to-face visit, made revisions and my assessment and plan is as follows.    ASSESSMENT & PLAN:   Diffuse large B cell lymphoma ([facility]) 1.  Stage IV diffuse large B-cell lymphoma: -Arising from a follicular lymphoma, PET/CT suspicious for hypermetabolic liver lesion (not biopsy proven) and left nasopharyngeal hypermetabolic some, breast lesion which was biopsy-proven DLBCL, bone marrow aspiration and biopsy negative -Status post R-CHOP x6 from 11/29/2016 through 03/14/2017, status post IT MTX for 2 doses in May and June 2018, could not tolerate it -PET scan on 04/29/2017 with complete CR.  -Physical examination did not reveal any lymphadenopathy or palpable splenomegaly. -CT CAP on 05/26/2018 shows stable exam.  Mammogram on 12/28/2007 was negative. - We have reviewed her blood work today.  LDH was within normal limits. -She will come back in 6 months for follow-up with blood work and physical exam.  We will do CT scans if clinical condition dictates.       Orders placed this encounter:  Orders Placed This Encounter  Procedures  .  Lactate dehydrogenase  . Protein electrophoresis, serum  . Kappa/lambda light chains  . CBC with Differential/Platelet  . Comprehensive metabolic panel      Derek Jack, MD [place] [phone]

## 2018-12-05 NOTE — Patient Instructions (Signed)
[facility] at [facility] Discharge Instructions     Thank you for choosing [facility] at [facility] to provide your oncology and hematology care.  To afford each patient quality time with our provider, please arrive at least 15 minutes before your scheduled appointment time.   If you have a lab appointment with the Cancer Center please come in thru the  Main Entrance and check in at the main information desk  You need to re-schedule your appointment should you arrive 10 or more minutes late.  We strive to give you quality time with our providers, and arriving late affects you and other patients whose appointments are after yours.  Also, if you no show three or more times for appointments you may be dismissed from the clinic at the providers discretion.     Again, thank you for choosing [facility].  Our hope is that these requests will decrease the amount of time that you wait before being seen by our physicians.       _____________________________________________________________  Should you have questions after your visit to [facility], please contact our office at [phone] between the hours of 8:00 a.m. and 4:30 p.m.  Voicemails left after 4:00 p.m. will not be returned until the following business day.  For prescription refill requests, have your pharmacy contact our office and allow 72 hours.    Cancer Center Support Programs:   > Cancer Support Group  2nd Tuesday of the month 1pm-2pm, Journey Room    

## 2018-12-30 ENCOUNTER — Other Ambulatory Visit: Payer: Self-pay | Admitting: Nurse Practitioner

## 2019-06-05 ENCOUNTER — Other Ambulatory Visit (HOSPITAL_COMMUNITY): Payer: Medicare Other

## 2019-06-12 ENCOUNTER — Ambulatory Visit (HOSPITAL_COMMUNITY): Payer: Medicare Other | Admitting: Nurse Practitioner

## 2019-09-01 DIAGNOSIS — Z23 Encounter for immunization: Secondary | ICD-10-CM | POA: Diagnosis not present

## 2020-09-28 DIAGNOSIS — Z23 Encounter for immunization: Secondary | ICD-10-CM | POA: Diagnosis not present

## 2021-05-05 DIAGNOSIS — Z23 Encounter for immunization: Secondary | ICD-10-CM | POA: Diagnosis not present

## 2021-05-10 DIAGNOSIS — R5383 Other fatigue: Secondary | ICD-10-CM | POA: Diagnosis not present

## 2021-05-10 DIAGNOSIS — Z299 Encounter for prophylactic measures, unspecified: Secondary | ICD-10-CM | POA: Diagnosis not present

## 2021-05-10 DIAGNOSIS — Z6824 Body mass index (BMI) 24.0-24.9, adult: Secondary | ICD-10-CM | POA: Diagnosis not present

## 2021-05-10 DIAGNOSIS — Z789 Other specified health status: Secondary | ICD-10-CM | POA: Diagnosis not present

## 2021-05-10 DIAGNOSIS — Z713 Dietary counseling and surveillance: Secondary | ICD-10-CM | POA: Diagnosis not present

## 2021-05-26 DIAGNOSIS — Z7189 Other specified counseling: Secondary | ICD-10-CM | POA: Diagnosis not present

## 2021-05-26 DIAGNOSIS — Z1339 Encounter for screening examination for other mental health and behavioral disorders: Secondary | ICD-10-CM | POA: Diagnosis not present

## 2021-05-26 DIAGNOSIS — Z6824 Body mass index (BMI) 24.0-24.9, adult: Secondary | ICD-10-CM | POA: Diagnosis not present

## 2021-05-26 DIAGNOSIS — R5383 Other fatigue: Secondary | ICD-10-CM | POA: Diagnosis not present

## 2021-05-26 DIAGNOSIS — E7800 Pure hypercholesterolemia, unspecified: Secondary | ICD-10-CM | POA: Diagnosis not present

## 2021-05-26 DIAGNOSIS — Z79899 Other long term (current) drug therapy: Secondary | ICD-10-CM | POA: Diagnosis not present

## 2021-05-26 DIAGNOSIS — Z1331 Encounter for screening for depression: Secondary | ICD-10-CM | POA: Diagnosis not present

## 2021-05-26 DIAGNOSIS — Z299 Encounter for prophylactic measures, unspecified: Secondary | ICD-10-CM | POA: Diagnosis not present

## 2021-05-26 DIAGNOSIS — Z0000 Encounter for general adult medical examination without abnormal findings: Secondary | ICD-10-CM | POA: Diagnosis not present

## 2021-05-29 DIAGNOSIS — Z79899 Other long term (current) drug therapy: Secondary | ICD-10-CM | POA: Diagnosis not present

## 2021-05-29 DIAGNOSIS — E7800 Pure hypercholesterolemia, unspecified: Secondary | ICD-10-CM | POA: Diagnosis not present

## 2021-05-29 DIAGNOSIS — R5383 Other fatigue: Secondary | ICD-10-CM | POA: Diagnosis not present

## 2021-06-27 DIAGNOSIS — W540XXA Bitten by dog, initial encounter: Secondary | ICD-10-CM | POA: Diagnosis not present

## 2021-06-27 DIAGNOSIS — Z299 Encounter for prophylactic measures, unspecified: Secondary | ICD-10-CM | POA: Diagnosis not present

## 2021-06-27 DIAGNOSIS — B1920 Unspecified viral hepatitis C without hepatic coma: Secondary | ICD-10-CM | POA: Diagnosis not present

## 2021-10-04 DIAGNOSIS — Z23 Encounter for immunization: Secondary | ICD-10-CM | POA: Diagnosis not present

## 2021-10-04 DIAGNOSIS — Z20828 Contact with and (suspected) exposure to other viral communicable diseases: Secondary | ICD-10-CM | POA: Diagnosis not present

## 2021-11-07 DIAGNOSIS — Z23 Encounter for immunization: Secondary | ICD-10-CM | POA: Diagnosis not present

## 2021-11-07 DIAGNOSIS — Z20828 Contact with and (suspected) exposure to other viral communicable diseases: Secondary | ICD-10-CM | POA: Diagnosis not present

## 2023-05-31 DIAGNOSIS — E7800 Pure hypercholesterolemia, unspecified: Secondary | ICD-10-CM | POA: Diagnosis not present

## 2023-05-31 DIAGNOSIS — R5383 Other fatigue: Secondary | ICD-10-CM | POA: Diagnosis not present

## 2023-05-31 DIAGNOSIS — Z1331 Encounter for screening for depression: Secondary | ICD-10-CM | POA: Diagnosis not present

## 2023-05-31 DIAGNOSIS — Z1339 Encounter for screening examination for other mental health and behavioral disorders: Secondary | ICD-10-CM | POA: Diagnosis not present

## 2023-05-31 DIAGNOSIS — Z79899 Other long term (current) drug therapy: Secondary | ICD-10-CM | POA: Diagnosis not present

## 2023-05-31 DIAGNOSIS — Z299 Encounter for prophylactic measures, unspecified: Secondary | ICD-10-CM | POA: Diagnosis not present

## 2023-05-31 DIAGNOSIS — Z7189 Other specified counseling: Secondary | ICD-10-CM | POA: Diagnosis not present

## 2023-05-31 DIAGNOSIS — Z0000 Encounter for general adult medical examination without abnormal findings: Secondary | ICD-10-CM | POA: Diagnosis not present

## 2023-06-04 DIAGNOSIS — Z299 Encounter for prophylactic measures, unspecified: Secondary | ICD-10-CM | POA: Diagnosis not present

## 2023-06-04 DIAGNOSIS — C4491 Basal cell carcinoma of skin, unspecified: Secondary | ICD-10-CM | POA: Diagnosis not present

## 2023-06-04 DIAGNOSIS — C44319 Basal cell carcinoma of skin of other parts of face: Secondary | ICD-10-CM | POA: Diagnosis not present

## 2023-06-04 DIAGNOSIS — L819 Disorder of pigmentation, unspecified: Secondary | ICD-10-CM | POA: Diagnosis not present

## 2023-06-19 DIAGNOSIS — C44319 Basal cell carcinoma of skin of other parts of face: Secondary | ICD-10-CM | POA: Diagnosis not present

## 2024-06-02 DIAGNOSIS — Z1331 Encounter for screening for depression: Secondary | ICD-10-CM | POA: Diagnosis not present

## 2024-06-02 DIAGNOSIS — Z0000 Encounter for general adult medical examination without abnormal findings: Secondary | ICD-10-CM | POA: Diagnosis not present

## 2024-06-02 DIAGNOSIS — E7800 Pure hypercholesterolemia, unspecified: Secondary | ICD-10-CM | POA: Diagnosis not present

## 2024-06-02 DIAGNOSIS — Z79899 Other long term (current) drug therapy: Secondary | ICD-10-CM | POA: Diagnosis not present

## 2024-06-02 DIAGNOSIS — R5383 Other fatigue: Secondary | ICD-10-CM | POA: Diagnosis not present

## 2024-06-02 DIAGNOSIS — Z299 Encounter for prophylactic measures, unspecified: Secondary | ICD-10-CM | POA: Diagnosis not present

## 2024-06-02 DIAGNOSIS — Z7189 Other specified counseling: Secondary | ICD-10-CM | POA: Diagnosis not present

## 2024-06-02 DIAGNOSIS — Z1339 Encounter for screening examination for other mental health and behavioral disorders: Secondary | ICD-10-CM | POA: Diagnosis not present

## 2024-07-13 DIAGNOSIS — Z299 Encounter for prophylactic measures, unspecified: Secondary | ICD-10-CM | POA: Diagnosis not present

## 2024-07-13 DIAGNOSIS — E7800 Pure hypercholesterolemia, unspecified: Secondary | ICD-10-CM | POA: Diagnosis not present

## 2024-07-13 DIAGNOSIS — R350 Frequency of micturition: Secondary | ICD-10-CM | POA: Diagnosis not present

## 2024-07-13 DIAGNOSIS — N390 Urinary tract infection, site not specified: Secondary | ICD-10-CM | POA: Diagnosis not present

## 2024-08-19 DIAGNOSIS — E8940 Asymptomatic postprocedural ovarian failure: Secondary | ICD-10-CM | POA: Diagnosis not present

## 2024-08-19 DIAGNOSIS — D0430 Carcinoma in situ of skin of unspecified part of face: Secondary | ICD-10-CM | POA: Diagnosis not present

## 2024-08-19 DIAGNOSIS — L819 Disorder of pigmentation, unspecified: Secondary | ICD-10-CM | POA: Diagnosis not present

## 2024-08-19 DIAGNOSIS — Z299 Encounter for prophylactic measures, unspecified: Secondary | ICD-10-CM | POA: Diagnosis not present

## 2024-08-19 DIAGNOSIS — B088 Other specified viral infections characterized by skin and mucous membrane lesions: Secondary | ICD-10-CM | POA: Diagnosis not present

## 2024-08-19 DIAGNOSIS — Z0000 Encounter for general adult medical examination without abnormal findings: Secondary | ICD-10-CM | POA: Diagnosis not present

## 2024-08-19 DIAGNOSIS — N390 Urinary tract infection, site not specified: Secondary | ICD-10-CM | POA: Diagnosis not present

## 2024-08-19 DIAGNOSIS — D0439 Carcinoma in situ of skin of other parts of face: Secondary | ICD-10-CM | POA: Diagnosis not present

## 2024-08-20 DIAGNOSIS — N390 Urinary tract infection, site not specified: Secondary | ICD-10-CM | POA: Diagnosis not present

## 2024-09-09 DIAGNOSIS — D0439 Carcinoma in situ of skin of other parts of face: Secondary | ICD-10-CM | POA: Diagnosis not present

## 2024-09-22 DIAGNOSIS — Z23 Encounter for immunization: Secondary | ICD-10-CM | POA: Diagnosis not present

## 2024-10-01 DIAGNOSIS — H2513 Age-related nuclear cataract, bilateral: Secondary | ICD-10-CM | POA: Diagnosis not present
